# Patient Record
Sex: Male | Born: 1960
Health system: Southern US, Community
[De-identification: ages and names within clinical notes are randomized; demographics above are authoritative.]

## PROBLEM LIST (undated history)

## (undated) DIAGNOSIS — F1910 Other psychoactive substance abuse, uncomplicated: Secondary | ICD-10-CM

## (undated) DIAGNOSIS — Q899 Congenital malformation, unspecified: Secondary | ICD-10-CM

## (undated) DIAGNOSIS — I5020 Unspecified systolic (congestive) heart failure: Secondary | ICD-10-CM

## (undated) DIAGNOSIS — H9190 Unspecified hearing loss, unspecified ear: Secondary | ICD-10-CM

## (undated) DIAGNOSIS — E785 Hyperlipidemia, unspecified: Secondary | ICD-10-CM

## (undated) HISTORY — DX: Other psychoactive substance abuse, uncomplicated: F19.10

## (undated) HISTORY — DX: Congenital malformation, unspecified: Q89.9

## (undated) HISTORY — DX: Hyperlipidemia, unspecified: E78.5

## (undated) HISTORY — PX: ACHILLES TENDON SURGERY: SHX542

## (undated) HISTORY — DX: Unspecified systolic (congestive) heart failure: I50.20

---

## 2015-03-01 ENCOUNTER — Encounter (HOSPITAL_COMMUNITY): Payer: Self-pay

## 2015-03-01 ENCOUNTER — Emergency Department (HOSPITAL_COMMUNITY)
Admission: EM | Admit: 2015-03-01 | Discharge: 2015-03-01 | Disposition: A | Discharge status: Home / Self Care | Payer: Self-pay | Attending: Emergency Medicine | Admitting: Emergency Medicine

## 2015-03-01 DIAGNOSIS — R062 Wheezing: Secondary | ICD-10-CM | POA: Insufficient documentation

## 2015-03-01 DIAGNOSIS — Y9389 Activity, other specified: Secondary | ICD-10-CM | POA: Insufficient documentation

## 2015-03-01 DIAGNOSIS — F1110 Opioid abuse, uncomplicated: Secondary | ICD-10-CM | POA: Insufficient documentation

## 2015-03-01 DIAGNOSIS — F1210 Cannabis abuse, uncomplicated: Secondary | ICD-10-CM | POA: Insufficient documentation

## 2015-03-01 DIAGNOSIS — H9192 Unspecified hearing loss, left ear: Secondary | ICD-10-CM | POA: Insufficient documentation

## 2015-03-01 DIAGNOSIS — Y9289 Other specified places as the place of occurrence of the external cause: Secondary | ICD-10-CM | POA: Insufficient documentation

## 2015-03-01 DIAGNOSIS — Y998 Other external cause status: Secondary | ICD-10-CM | POA: Insufficient documentation

## 2015-03-01 DIAGNOSIS — T50901A Poisoning by unspecified drugs, medicaments and biological substances, accidental (unintentional), initial encounter: Secondary | ICD-10-CM

## 2015-03-01 DIAGNOSIS — T401X1A Poisoning by heroin, accidental (unintentional), initial encounter: Secondary | ICD-10-CM | POA: Insufficient documentation

## 2015-03-01 DIAGNOSIS — F17210 Nicotine dependence, cigarettes, uncomplicated: Secondary | ICD-10-CM | POA: Insufficient documentation

## 2015-03-01 HISTORY — DX: Unspecified hearing loss, unspecified ear: H91.90

## 2015-03-01 LAB — CBC WITH DIFFERENTIAL/PLATELET
Basophils Absolute: 0 10*3/uL (ref 0.0–0.1)
Basophils Relative: 0 %
Eosinophils Absolute: 0.1 10*3/uL (ref 0.0–0.7)
Eosinophils Relative: 2 %
HCT: 34.9 % — ABNORMAL LOW (ref 39.0–52.0)
Hemoglobin: 12.1 g/dL — ABNORMAL LOW (ref 13.0–17.0)
Lymphocytes Relative: 38 %
Lymphs Abs: 2.4 10*3/uL (ref 0.7–4.0)
MCH: 32.2 pg (ref 26.0–34.0)
MCHC: 34.7 g/dL (ref 30.0–36.0)
MCV: 92.8 fL (ref 78.0–100.0)
Monocytes Absolute: 0.4 10*3/uL (ref 0.1–1.0)
Monocytes Relative: 6 %
Neutro Abs: 3.5 10*3/uL (ref 1.7–7.7)
Neutrophils Relative %: 54 %
Platelets: 209 10*3/uL (ref 150–400)
RBC: 3.76 MIL/uL — ABNORMAL LOW (ref 4.22–5.81)
RDW: 12.8 % (ref 11.5–15.5)
WBC: 6.5 10*3/uL (ref 4.0–10.5)

## 2015-03-01 LAB — BASIC METABOLIC PANEL
Anion gap: 8 (ref 5–15)
BUN: 17 mg/dL (ref 6–20)
CO2: 27 mmol/L (ref 22–32)
Calcium: 9.3 mg/dL (ref 8.9–10.3)
Chloride: 108 mmol/L (ref 101–111)
Creatinine, Ser: 0.77 mg/dL (ref 0.61–1.24)
GFR calc Af Amer: >60 mL/min (ref >60)
GFR calc non Af Amer: >60 mL/min (ref >60)
Glucose, Bld: 96 mg/dL (ref 65–99)
Potassium: 4 mmol/L (ref 3.5–5.1)
Sodium: 143 mmol/L (ref 135–145)

## 2015-03-01 LAB — RAPID URINE DRUG SCREEN, HOSP PERFORMED
Amphetamines: NOT DETECTED
Barbiturates: NOT DETECTED
Benzodiazepines: NOT DETECTED
Cocaine: NOT DETECTED
Opiates: POSITIVE — AB
Tetrahydrocannabinol: POSITIVE — AB

## 2015-03-01 LAB — TROPONIN I: Troponin I: <0.03 ng/mL (ref <0.031)

## 2015-03-01 MED ORDER — SODIUM CHLORIDE 0.9 % IV BOLUS (SEPSIS)
1000.0000 mL | Freq: Once | INTRAVENOUS | Status: AC
  Administered 2015-03-01: 1000 mL via INTRAVENOUS
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
## 2015-03-01 NOTE — Discharge Instructions (Signed)
Drug Overdose °Drug overdose happens when you take too much of a drug. An overdose can occur with illegal drugs, prescription drugs, or over-the-counter (OTC) drugs. °The effects of drug overdose can be mild, dangerous, or even deadly. °CAUSES °Drug overdose may be caused by: °· Taking too much of a drug on purpose. °· Taking too much of a drug by accident. °· An error made by a health care provider who prescribes a drug. °· An error made by a pharmacist who fills the prescription order. °Drugs that commonly cause overdose include: °· Mental health drugs. °· Pain medicines. °· Illegal drugs. °· OTC cough and cold medicines. °· Heart medicines. °· Seizure medicines. °RISK FACTORS °Drug overdose is more likely in: °· Children. They may be attracted to colorful pills. Because of children's small size, even a small amount of a drug can be dangerous. °· Elderly people. They may be taking many different drugs. Elderly people may have difficulty reading labels or remembering when they last took their medicine. °The risk of drug overdose is also higher for someone who: °· Takes illegal drugs. °· Takes a drug and drinks alcohol. °· Has a mental health condition. °SYMPTOMS °Signs and symptoms of drug overdose depend on the drug and the amount that was taken. Common danger signs include: °· Behavior changes. °· Sleepiness. °· Slowed breathing. °· Nausea and vomiting. °· Seizures. °· Changes in eye pupil size (very large or very small). °If there are signs of very low blood pressure from a drug overdose (shock), emergency treatment is required. These signs include: °· Cold and clammy skin. °· Pale skin. °· Blue lips. °· Very slow breathing. °· Extreme sleepiness. °· Loss of consciousness. °DIAGNOSIS °Drug overdose may be diagnosed based on your symptoms. It is important that you tell your health care provider: °· All of the drugs that you have taken. °· When you took the drugs. °· Whether you were drinking alcohol. °Your health  care provider will do a physical exam. This exam may include: °· Checking and monitoring your heart rate and rhythm, your temperature, and your blood pressure (vital signs). °· Checking your breathing and oxygen level. °You may also have tests, including:  °· Urine tests to check for drugs in your system. °· Blood tests to check for: °¨ Drugs in your system. °¨ Signs of an imbalance of your blood minerals (electrolytes). °¨ Liver damage. °¨ Kidney damage. °TREATMENT °Supporting your vital signs and your breathing is the first step in treating a drug overdose. Treatment may also include: °· Receiving fluids and electrolytes through an IV tube. °· Having a breathing tube (endotracheal tube) inserted in your airway to help you breathe. °· Having a tube passed through your nose and into your stomach (nasogastric tube) to wash out your stomach. °· Medicines. You may get medicines to: °¨ Make you vomit. °¨ Absorb any medicine that is left in your digestive system (activated charcoal). °¨ Block or reverse the effect of the drug that caused the overdose. °· Having your blood filtered through an artificial kidney machine (hemodialysis). You may need this if your overdose is severe or if you have kidney failure. °· Having ongoing counseling and mental health support if you intentionally overdosed or used an illegal drug. °HOME CARE INSTRUCTIONS °· Take medicines only as directed by your health care provider. Always ask your health care provider to discuss the possible side effects of any new drug that you start taking. °· Keep a list of all of the drugs   that you take, including over-the-counter medicines. Bring this list with you to all of your medical visits.  Read the drug inserts that come with your medicines.  Do not use illegal drugs.  Do not drink alcohol when taking drugs.  Store all medicines in safety containers that are out of the reach of children.  Keep the phone number of your local poison control  center near your phone or on your cell phone.  Get help if you are struggling with alcohol or drug use.  Get help if you are struggling with depression or another mental health problem.  Keep all follow-up visits as directed by your health care provider. This is important. SEEK MEDICAL CARE IF:  Your symptoms return.  You develop any new signs or symptoms when you are taking medicines. SEEK IMMEDIATE MEDICAL CARE IF:  You think that you or someone else may have taken too much of a drug. The hotline of the [facility] is 845-507-3659.  You or someone else is having symptoms of a drug overdose.  You have serious thoughts about hurting yourself or others.  You have chest pain.  You have difficulty breathing.  You have a loss of consciousness. Drug overdose is an emergency. Do not wait to see if the symptoms will go away. Get medical help right away. Call your local emergency services (911 in the U.[place] Do not drive yourself to the hospital.   This information is not intended to replace advice given to you by your health care provider. Make sure you discuss any questions you have with your health care provider.   Document Released: 05/12/2014 Document Reviewed: 05/12/2014 Elsevier Interactive Patient Education [employer].   Emergency Department Resource Guide 1) Find a Doctor and Pay Out of Pocket Although you won't have to find out who is covered by your insurance plan, it is a good idea to ask around and get recommendations. You will then need to call the office and see if the doctor you have chosen will accept you as a new patient and what types of options they offer for patients who are self-pay. Some doctors offer discounts or will set up payment plans for their patients who do not have insurance, but you will need to ask so you aren't surprised when you get to your appointment.  2) Contact Your Local Health Department Not all health departments  have doctors that can see patients for sick visits, but many do, so it is worth a call to see if yours does. If you don't know where your local health department is, you can check in your phone book. The CDC also has a tool to help you locate your state's health department, and many state websites also have listings of all of their local health departments.  3) Find a [facility] If your illness is not likely to be very severe or complicated, you may want to try a walk in clinic. These are popping up all over the country in pharmacies, drugstores, and shopping centers. They're usually staffed by nurse practitioners or physician assistants that have been trained to treat common illnesses and complaints. They're usually fairly quick and inexpensive. However, if you have serious medical issues or chronic medical problems, these are probably not your best option.  No Primary Care Doctor: - Call Health Connect at  [phone] - they can help you locate a primary care doctor that  accepts your insurance, provides certain services, etc. - Physician Referral Service-  [phone]  Chronic Pain Problems: Organization         Address  Phone   Notes  [facility]  [phone] Patients need to be referred by their primary care doctor.   Medication Assistance: Organization         Address  Phone   Notes  [facility] Medication [facility] [address] [place], [place] [address] [phone] --Must be a resident of [facility] -- Must have NO insurance coverage whatsoever (no Medicaid/ Medicare, etc.) -- The pt. MUST have a primary care doctor that directs their care regularly and follows them in the community   MedAssist  [phone]   [employer]  [phone]    Agencies that provide inexpensive medical care: Organization         Address  Phone   Notes  [place]  [phone]   [facility]    [phone]    [facility] [place],  [address] [phone]   [place] [address] [place] [phone]   Planned Parenthood    [phone]   [facility]    [phone]   [place] and [place], [place] Phone:  [phone], Fax:  [phone] Hours of Operation:  9 am - 6 pm, M-F.  Also accepts Medicaid/Medicare and self-pay.  [facility] for [place], [address], [place] Phone: [phone], Fax: [phone]. Hours of Operation:  8:30 am - 5:30 pm, M-F.  Also accepts Medicaid and self-pay.  [facility] [place] [address], [place] Phone: [phone]   [place], [place], [place] [phone], Ext. [phone] Mondays & Thursdays: 7-9 AM.  First 15 patients are seen on a first come, first serve basis.    [place] Providers:  Organization         Address  Phone   Notes  [facility] [address], [place] [phone] Also accepts self-pay patients.  [facility] [id number] [place], [place]  [phone]   [place], [address], [place] [phone]   [facility] [address] [place] [phone]   Lucianne Lei [address], [place]   [phone] Only accepts [place] Access [place] patients after they have their name applied to their card.   Self-Pay (no insurance) in [facility]:  Organization         Address  Phone   Notes  Sickle Cell Patients, [facility] Internal Medicine [place] [phone]   [facility] [place] [phone]   [facility] [place]  [place], [place],  [phone]   [facility]/Dr. Osei-Bonsu  [address], [place] or [address], [place] [phone] Phone number for both [place] and [place] locations is the same.  [facility] [address] [place] [phone]   [facility] [address], [place] or [address] [phone]   [facility] [address] [place] [phone], phone; [phone],  fax Sees patients 1st and 3rd Saturday of every month.  Must not qualify for public or private insurance (i.e. Medicaid, Medicare, [place] Health Choice, Veterans' Benefits)  Household income should be no more than 200% of the poverty level The clinic cannot treat you if you are pregnant or think you are pregnant  Sexually transmitted diseases are not treated at the clinic.    Dental Care: Organization         Address  Phone  Notes  [facility] Department of [facility] [place] [phone] Accepts children up to age 55 who are enrolled in [place] or [place]; pregnant women with a Medicaid card; and children who have applied for Medicaid or [place] Health Choice, but were declined, whose parents can pay a reduced fee at time of service.  [facility] Department of [facility]  [address], [place] [phone] Accepts children up to age 52 who are enrolled in [place] or [place]; pregnant women with a Medicaid card; and children who have applied for Medicaid or [place] Health Choice, but were declined, whose parents can pay a reduced fee at time of service.  [place] Adult Dental Access PROGRAM  [place] [phone] Patients are seen by appointment only. Walk-ins are not accepted. [place] will see patients 92 years of age and older. Monday - Tuesday (8am-5pm) Most Wednesdays (8:30-5pm) $30 per visit, cash only  [facility]  Adult Dental Access PROGRAM  [address], [facility] [phone] Patients are seen by appointment only. Walk-ins are not accepted. [place] will see patients 29 years of age and older. One Wednesday Evening (Monthly: Volunteer Based).  $30 per visit, cash only  [place]  [phone] for adults; Children under age 61, call Graduate Pediatric Dentistry at [phone]. Children aged 17-14, please call [phone] to request a pediatric application.  Dental services are provided in all areas of dental care including fillings, crowns and bridges, complete and partial dentures, implants, gum treatment, root canals, and extractions. Preventive care is also provided. Treatment is provided to both adults and children. Patients are selected via a lottery and there is often a waiting list.   [facility] [address] [place]  [phone] [email or web address]   [facility] [address] [place], [place] [phone], Ext. [phone] Second and Fourth Thursday of each month, opens at 6:30 AM; Clinic ends at 9 AM.  Patients are seen on a first-come first-served basis, and a limited number are seen during each clinic.   [facility]  [address] Hillard Danker [place], [place] [phone]   Eligibility Requirements You must have lived in [place], [place], or [place] counties for at least the last three months.   You cannot be eligible for state or federal sponsored [employer], including [employer], [place], or [employer].   You generally cannot be eligible for healthcare insurance through your employer.    How to apply: Eligibility screenings are held every Tuesday and Wednesday afternoon from 1:00 pm until 4:00 pm. You do not need an appointment for the interview!  [facility] [address] [place], [place]   [place]  [place] Department  [place]  [phone]    Behavioral Health Resources in the Community: Intensive  Outpatient Programs Organization         Address  Phone  Notes  [place] [address] [place], [place] [phone]   [facility] Outpatient [address], [place], [place]   ADS: Alcohol & Drug Svcs [address] [place], [place]   [place] [address]  [place], [place] or [phone]   Substance Abuse Resources Organization         Address  Phone  Notes  Alcohol and Drug Services  [phone]   [place]  [phone]   [facility] [place]   Chinita Pester  [phone]   Residential & Outpatient Substance Abuse Program  [phone]   Psychological Services Organization         Address  Phone  Notes  [facility] [place]  [place]  [phone]   [place] [address], [place] or [phone]    Mobile Crisis Teams Organization         Address  Phone  Notes  [facility], Mobile Crisis Care Unit  [phone]   [facility]ive Psychotherapeutic Services  [address] [place], [place]   [place] [address] [place] [phone]    Self-Help/Support Groups Organization         Address  Phone             Notes  [place]. of [place] - variety of support groups  [place] Call for more information  Narcotics Anonymous (NA), [facility] [address], [employer] [place]  2 meetings at this location   Special educational needs teacher         Address  Phone  Notes  [facility] Residential Treatment [place],    [place]  [phone]   [facility]  [address], [place] [id number], [place], [place]   [place]uthrie Duncan, [place] [phone] Admissions: 8am-3pm M-F  Incentives Substance [place] [address],    [place], [place] [phone]   [facility] [address] [place], [place], [place]   [facility] [address]  [place], [place]   [facility] Programs - Intensive Outpatient [address] Kristeen [address], [place], [place]   [facility] [address]  [place], [place] [phone] or [phone]   [facility] Treatment Services (RTS) [address], [place], [place] Accepts Medicaid  [facility] [place] [address]  [place] [phone] Substance Abuse/Addiction Treatment   [facility] Organization         Address  Phone  Notes  [facility]  [phone]   Domenic Schwab, PhD [address] Arlis Porta [place], [place]   [phone] or [phone]   [place], [place] [phone]   [place] [address], [place], [place] [phone] Insurance/Medicaid/sponsorship through [facility] and Families [address] [place]                                    [place], [place] [phone] [place] [address], [place] [phone]    Dr.  Arfeen  [phone]   [facility] Dept. 1) [address] [place] 2) [place] 3)  [place] [address], [place] [phone]  [phone]   [place] [phone] or [phone] (After Hours)

## 2015-03-24 ENCOUNTER — Ambulatory Visit: Payer: Self-pay

## 2016-11-04 ENCOUNTER — Emergency Department (HOSPITAL_COMMUNITY)
Admission: EM | Admit: 2016-11-04 | Discharge: 2016-11-04 | Disposition: A | Discharge status: Home / Self Care | Payer: BLUE CROSS/BLUE SHIELD | Attending: Emergency Medicine | Admitting: Emergency Medicine

## 2016-11-04 ENCOUNTER — Encounter (HOSPITAL_COMMUNITY): Payer: Self-pay

## 2016-11-04 DIAGNOSIS — Z7151 Drug abuse counseling and surveillance of drug abuser: Secondary | ICD-10-CM | POA: Insufficient documentation

## 2016-11-04 DIAGNOSIS — Z5321 Procedure and treatment not carried out due to patient leaving prior to being seen by health care provider: Secondary | ICD-10-CM | POA: Diagnosis not present

## 2016-11-04 NOTE — ED Notes (Signed)
Pt no longer in room.  

## 2016-11-04 NOTE — ED Triage Notes (Signed)
Pt states that he did heroin tonight and it freaked him out, he tried to calm down in his room and his family wanted him to come in for heroin detox

## 2016-11-04 NOTE — ED Notes (Signed)
Bed: WTR7 Expected date:  Expected time:  Means of arrival:  Comments: 

## 2017-05-03 ENCOUNTER — Ambulatory Visit (HOSPITAL_COMMUNITY)
Admission: EM | Admit: 2017-05-03 | Discharge: 2017-05-03 | Disposition: A | Discharge status: Home / Self Care | Payer: Worker's Compensation | Attending: Internal Medicine | Admitting: Internal Medicine

## 2017-05-03 ENCOUNTER — Ambulatory Visit (INDEPENDENT_AMBULATORY_CARE_PROVIDER_SITE_OTHER): Payer: Worker's Compensation

## 2017-05-03 ENCOUNTER — Encounter (HOSPITAL_COMMUNITY): Payer: Self-pay | Admitting: Family Medicine

## 2017-05-03 DIAGNOSIS — M25572 Pain in left ankle and joints of left foot: Secondary | ICD-10-CM

## 2017-05-03 MED ORDER — MELOXICAM 7.5 MG PO TABS: 7.5000 mg | ORAL_TABLET | Freq: Every day | ORAL | 0 refills | Status: DC

## 2017-05-03 NOTE — ED Provider Notes (Signed)
[place]    CSN: [id number] Arrival date & time: 05/03/17  1656     History   Chief Complaint Chief Complaint  Patient presents with  . Ankle Pain    HPI Bryan Herring is a 57 y.o. male.   57 year old male comes in for evaluation of left ankle pain after injury about 2:30pm today.  Patient works at [employer], was working when he slipped off of a curb, states he both inverted and everted his ankle.  He was not able to ambulate after the incident.  States diffuse pain around the ankles that is worse with weightbearing.  Has been limping/hopping since the incident.  Took ibuprofen 400 mg with some relief.  No obvious swelling, contusion.  Denies numbness, tingling.      Past Medical History:  Diagnosis Date  . Deaf    left ear    There are no active problems to display for this patient.   Past Surgical History:  Procedure Laterality Date  . ACHILLES TENDON SURGERY         Home Medications    Prior to Admission medications   Medication Sig Start Date End Date Taking? Authorizing Provider  Aspirin-Acetaminophen-Caffeine (PAIN RELIEVER PLUS PO) Take 1 tablet by mouth every 6 (six) hours as needed (pain.).    [provider]  meloxicam (MOBIC) 7.5 MG tablet Take 1 tablet (7.5 mg total) by mouth daily. 05/03/17   Ok Edwards, PA-C    Family History Family History  Problem Relation Age of Onset  . Aneurysm Mother     Social History Social History   Tobacco Use  . Smoking status: Current Every Day Smoker    Packs/day: 0.25    Types: Cigarettes  . Smokeless tobacco: Never Used  Substance Use Topics  . Alcohol use: Yes    Comment: 3 times a week  . Drug use: Yes    Types: Marijuana    Comment: heroin      Allergies   Patient has no known allergies.   Review of Systems Review of Systems  Reason unable to perform ROS: See HPI as above.     Physical Exam Triage Vital Signs ED Triage Vitals  Enc Vitals  Group     BP 05/03/17 1743 (!) 146/111     Pulse Rate 05/03/17 1743 75     Resp 05/03/17 1743 18     Temp 05/03/17 1743 98.4 F (36.9 C)     Temp src --      SpO2 05/03/17 1743 100 %     Weight --      Height --      Head Circumference --      Peak Flow --      Pain Score 05/03/17 1744 10     Pain Loc --      Pain Edu? --      Excl. in [place]? --    No data found.  Updated Vital Signs BP (!) 146/111   Pulse 75   Temp 98.4 F (36.9 C)   Resp 18   SpO2 100%   Physical Exam  Constitutional: He is oriented to person, place, and time. He appears well-developed and well-nourished. No distress.  HENT:  Head: Normocephalic and atraumatic.  Eyes: Pupils are equal, round, and reactive to light. Conjunctivae are normal.  Musculoskeletal:  No obvious swelling, erythema, increased warmth, contusion to the ankle.  Tenderness to palpation diffusely of the ankle.  Tenderness to  palpation of proximal MTPs.  Decreased range of motion.  Strength deferred.  Sensation intact.  Pedal pulse 2+ and equal bilaterally.  Cap refill less than 2 seconds.  Neurological: He is alert and oriented to person, place, and time.     UC Treatments / Results  Labs (all labs ordered are listed, but only abnormal results are displayed) Labs Reviewed - No data to display  EKG None Radiology Dg Ankle Complete Left  Result Date: 05/03/2017 CLINICAL DATA:  Left ankle pain after injury today. EXAM: LEFT ANKLE COMPLETE - 3+ VIEW COMPARISON:  None. FINDINGS: There is no evidence of fracture, dislocation, or joint effusion. There is no evidence of arthropathy or other focal bone abnormality. Soft tissues are unremarkable. IMPRESSION: Normal left ankle. Electronically Signed   By: Marijo Conception, M.D.   On: 05/03/2017 18:29    Procedures Procedures (including critical care time)  Medications Ordered in UC Medications - No data to display   Initial Impression / Assessment and Plan / UC Course  I have reviewed  the triage vital signs and the nursing notes.  Pertinent labs & imaging results that were available during my care of the patient were reviewed by me and considered in my medical decision making (see chart for details).    X-ray negative for fracture or dislocation.  Mobic, ice compress, elevation, ankle brace during activity.  Crutches as needed.  Follow-up with occupational health for further evaluation and management needed.  Final Clinical Impressions(s) / UC Diagnoses   Final diagnoses:  Acute left ankle pain    ED Discharge Orders        Ordered    meloxicam (MOBIC) 7.5 MG tablet  Daily     05/03/17 1841         Ok Edwards, PA-C 05/03/17 2008

## 2017-05-03 NOTE — ED Triage Notes (Signed)
Pt here for left ankle pain after twisting it today when he slipped of a curb.

## 2017-05-03 NOTE — Discharge Instructions (Addendum)
X-ray negative for fracture or dislocation.  Start Mobic as directed.  Ice compress, elevation.  Ankle brace during activity.  He can use crutches as needed while your ankle is healing.  Follow-up with occupational health tomorrow for reevaluation and assessment.

## 2017-08-19 ENCOUNTER — Emergency Department (HOSPITAL_COMMUNITY): Payer: Self-pay

## 2017-08-19 ENCOUNTER — Emergency Department (HOSPITAL_COMMUNITY)
Admission: EM | Admit: 2017-08-19 | Discharge: 2017-08-19 | Disposition: A | Discharge status: Home / Self Care | Payer: Self-pay | Attending: Emergency Medicine | Admitting: Emergency Medicine

## 2017-08-19 ENCOUNTER — Other Ambulatory Visit: Payer: Self-pay

## 2017-08-19 ENCOUNTER — Encounter (HOSPITAL_COMMUNITY): Payer: Self-pay

## 2017-08-19 DIAGNOSIS — N289 Disorder of kidney and ureter, unspecified: Secondary | ICD-10-CM | POA: Insufficient documentation

## 2017-08-19 DIAGNOSIS — F17210 Nicotine dependence, cigarettes, uncomplicated: Secondary | ICD-10-CM | POA: Insufficient documentation

## 2017-08-19 DIAGNOSIS — R10816 Epigastric abdominal tenderness: Secondary | ICD-10-CM | POA: Insufficient documentation

## 2017-08-19 DIAGNOSIS — E860 Dehydration: Secondary | ICD-10-CM | POA: Insufficient documentation

## 2017-08-19 DIAGNOSIS — E876 Hypokalemia: Secondary | ICD-10-CM | POA: Insufficient documentation

## 2017-08-19 DIAGNOSIS — R55 Syncope and collapse: Secondary | ICD-10-CM

## 2017-08-19 LAB — COMPREHENSIVE METABOLIC PANEL
ALK PHOS: 48 U/L (ref 38–126)
ALT: 17 U/L (ref 0–44)
ANION GAP: 11 (ref 5–15)
AST: 21 U/L (ref 15–41)
Albumin: 4.2 g/dL (ref 3.5–5.0)
BUN: 12 mg/dL (ref 6–20)
CHLORIDE: 107 mmol/L (ref 98–111)
CO2: 25 mmol/L (ref 22–32)
CREATININE: 1.52 mg/dL — AB (ref 0.61–1.24)
Calcium: 9 mg/dL (ref 8.9–10.3)
GFR calc Af Amer: 57 mL/min — ABNORMAL LOW (ref >60)
GFR calc non Af Amer: 50 mL/min — ABNORMAL LOW (ref >60)
GLUCOSE: 214 mg/dL — AB (ref 70–99)
Potassium: 2.9 mmol/L — ABNORMAL LOW (ref 3.5–5.1)
Sodium: 143 mmol/L (ref 135–145)
Total Bilirubin: 0.7 mg/dL (ref 0.3–1.2)
Total Protein: 7.4 g/dL (ref 6.5–8.1)

## 2017-08-19 LAB — I-STAT CG4 LACTIC ACID, ED
LACTIC ACID, VENOUS: 2.75 mmol/L — AB (ref 0.5–1.9)
Lactic Acid, Venous: 4.23 mmol/L (ref 0.5–1.9)

## 2017-08-19 LAB — CBC
HCT: 40 % (ref 39.0–52.0)
Hemoglobin: 13.6 g/dL (ref 13.0–17.0)
MCH: 32.1 pg (ref 26.0–34.0)
MCHC: 34 g/dL (ref 30.0–36.0)
MCV: 94.3 fL (ref 78.0–100.0)
PLATELETS: 221 10*3/uL (ref 150–400)
RBC: 4.24 MIL/uL (ref 4.22–5.81)
RDW: 13.3 % (ref 11.5–15.5)
WBC: 7.8 10*3/uL (ref 4.0–10.5)

## 2017-08-19 LAB — URINALYSIS, ROUTINE W REFLEX MICROSCOPIC
Bacteria, UA: NONE SEEN
Bilirubin Urine: NEGATIVE
GLUCOSE, UA: 50 mg/dL — AB
KETONES UR: NEGATIVE mg/dL
LEUKOCYTES UA: NEGATIVE
Nitrite: NEGATIVE
PH: 7 (ref 5.0–8.0)
PROTEIN: NEGATIVE mg/dL
Specific Gravity, Urine: 1.008 (ref 1.005–1.030)

## 2017-08-19 LAB — I-STAT CHEM 8, ED
BUN: 10 mg/dL (ref 6–20)
CHLORIDE: 104 mmol/L (ref 98–111)
Calcium, Ion: 1.09 mmol/L — ABNORMAL LOW (ref 1.15–1.40)
Creatinine, Ser: 1.6 mg/dL — ABNORMAL HIGH (ref 0.61–1.24)
Glucose, Bld: 203 mg/dL — ABNORMAL HIGH (ref 70–99)
HCT: 41 % (ref 39.0–52.0)
Hemoglobin: 13.9 g/dL (ref 13.0–17.0)
POTASSIUM: 2.9 mmol/L — AB (ref 3.5–5.1)
SODIUM: 143 mmol/L (ref 135–145)
TCO2: 25 mmol/L (ref 22–32)

## 2017-08-19 LAB — I-STAT TROPONIN, ED
Troponin i, poc: 0 ng/mL (ref 0.00–0.08)
Troponin i, poc: 0.02 ng/mL (ref 0.00–0.08)

## 2017-08-19 LAB — CBG MONITORING, ED: Glucose-Capillary: 214 mg/dL — ABNORMAL HIGH (ref 70–99)

## 2017-08-19 MED ORDER — SODIUM CHLORIDE 0.9 % IV BOLUS
1000.0000 mL | Freq: Once | INTRAVENOUS | Status: AC
  Administered 2017-08-19: 1000 mL via INTRAVENOUS

## 2017-08-19 MED ORDER — POTASSIUM CHLORIDE CRYS ER 20 MEQ PO TBCR
40.0000 meq | EXTENDED_RELEASE_TABLET | Freq: Once | ORAL | Status: AC
  Administered 2017-08-19: 40 meq via ORAL
  Filled 2017-08-19: qty 2

## 2017-08-19 NOTE — ED Provider Notes (Signed)
4:30 PM-checkout from Dr. Ralene Bathe to evaluate after treatment, to consider discharge.  Patient's delta lactate has returned elevated, despite receiving some IV fluids.  He has apparently received 2 L.  No weight on patient yet.  Will check weight and make sure he has 30 cc/kg.  Prior evaluation reviewed.  Clinical Course as of Aug 20 1822  Nancy Fetter Aug 19, 2017  1724 Elevated, higher than initial  I-Stat CG4 Lactic Acid, ED(!!) [EW]  1725 Normal except presence of glucose and hemoglobin  Urinalysis, Routine w reflex microscopic(!) [EW]  1725 Normal  I-stat troponin, ED [EW]  1725 Normal except potassium low, glucose high, creatinine high  Comprehensive metabolic panel(!) [EW]  9485 No additional abnormalities  I-stat Chem 8, ED(!) [EW]  1725 Normal  CBC [EW]  1725 No intracranial abnormality, apparent posterior contusion, images reviewed by me  CT Head Wo Contrast [EW]  1737 Brief physical exam done to evaluate patient with elevated lactate, and hypotension.  He is alert and cooperative.  He appears somewhat under nourished.  There is no respiratory distress.  Abdomen is soft and nontender.  Arms move normally do not appear to have any signs of swelling or infection.  Pelvis tender right posterior without deformity.  He moves both legs normally.  There are no sores or abscesses on the legs.  Patient ate a sandwich here and states he is currently hungry again.  Will attempt to give him more food today.   [EW]    Clinical Course User Index [EW] Daleen Bo, MD     EKG Interpretation  Date/Time:  Sunday August 19 2017 15:44:48 EDT Ventricular Rate:  73 PR Interval:    QRS Duration: 87 QT Interval:  398 QTC Calculation: 439 R Axis:   75 Text Interpretation:  Sinus rhythm Left ventricular hypertrophy ST elevation c/w LVH vs early repolarization Confirmed by Quintella Reichert [phone]) on 08/19/2017 3:49:41 PM         Patient Vitals for the past 24 hrs:  BP Temp Temp src Pulse Resp SpO2  Height Weight  08/19/17 1800 - - - - - - 6' (1.829 m) 74.8 kg  08/19/17 1745 126/88 - - 85 20 97 % - -  08/19/17 1730 136/90 - - - 18 - - -  08/19/17 1715 (!) 141/96 - - 78 18 100 % - -  08/19/17 1700 (!) 131/97 - - 76 19 98 % - -  08/19/17 1645 (!) 146/100 - - 77 19 100 % - -  08/19/17 1630 (!) 137/111 - - 90 (!) 21 100 % - -  08/19/17 1615 (!) 138/100 - - 81 18 100 % - -  08/19/17 1600 (!) 131/100 - - 81 15 100 % - -  08/19/17 1545 (!) 156/103 - - 74 18 100 % - -  08/19/17 1335 (!) 59/42 97.6 F (36.4 C) Oral 73 16 98 % - -    6:24 PM Reevaluation with update and discussion. After initial assessment and treatment, an updated evaluation reveals no worsening status.  Vital signs are now normal.  Findings discussed and questions answered. Daleen Bo   Medical Decision Making: Syncope with hypokalemia, and renal insufficiency.  Suspect dehydration as cause for syncope.  Contusion had without signs of intracranial bleeding or persistent symptoms of concussion.  Patient improved and stable for discharge.  Doubt serious bacterial infection, metabolic instability or impending vascular collapse.  CRITICAL CARE-no Performed by: Daleen Bo   Nursing Notes Reviewed/ Care Coordinated Applicable Imaging Reviewed  Interpretation of Laboratory Data incorporated into ED treatment  The patient appears reasonably screened and/or stabilized for discharge and I doubt any other medical condition or other [facility] requiring further screening, evaluation, or treatment in the ED at this time prior to discharge.  Plan: Home Medications-OTC analgesia if needed; Home Treatments-increase oral fluids; return here if the recommended treatment, does not improve the symptoms; Recommended follow up-PCP follow-up 1 week for recheck creatinine and potassium.     Daleen Bo, MD 08/19/17 [phone]

## 2017-08-19 NOTE — ED Notes (Signed)
EDP [facility] notified of critical lactic acid of 4.23

## 2017-08-19 NOTE — ED Triage Notes (Addendum)
Pt reports 2 episodes of syncope today. Pt remembers falling, but is not sure of details. He does endorse hitting the side of his head during one fall. Pt is very hypotensive in triage. Reports feeling weak. States that he has only had some coffee today. Pt denies any additional medical history. States that he hasn't been to the doctor in years.

## 2017-08-19 NOTE — ED Provider Notes (Addendum)
[facility] DEPT Provider Note   CSN: [id number] Arrival date & time: 08/19/17  1327     History   Chief Complaint Chief Complaint  Patient presents with  . Hypotension    HPI Bryan Herring is a 57 y.o. male.  The history is provided by the patient. No language interpreter was used.   Bryan Herring is a 57 y.o. male who presents to the Emergency Department complaining of syncope. He presents to the emergency department complaining of syncope times two. He has a history of drug abuse and loss abuse heroin three days ago. He sniffs heroin. Yesterday he had multiple episodes of emesis that he relates to withdrawing from heroin. Today he was out of the car wash and he smokes marijuana. He then began to feel lightheaded and fell backwards, striking his head. He did lose consciousness and his sister states that he was unresponsive for a few minutes. He had no shaking activity. He was able to get back up and go home. Then he had another syncopal event on arriving home. He denies any hematochezia, hematemesis. He has no medical problems and takes no medications. No prior similar symptoms. Past Medical History:  Diagnosis Date  . Deaf    left ear    There are no active problems to display for this patient.   Past Surgical History:  Procedure Laterality Date  . ACHILLES TENDON SURGERY          Home Medications    Prior to Admission medications   Medication Sig Start Date End Date Taking? Authorizing Provider  meloxicam (MOBIC) 7.5 MG tablet Take 1 tablet (7.5 mg total) by mouth daily. Patient not taking: Reported on 08/19/2017 05/03/17   Arturo Morton    Family History Family History  Problem Relation Age of Onset  . Aneurysm Mother     Social History Social History   Tobacco Use  . Smoking status: Current Every Day Smoker    Packs/day: 0.25    Types: Cigarettes  . Smokeless tobacco: Never Used  Substance Use Topics   . Alcohol use: Yes    Comment: 3 times a week  . Drug use: Yes    Types: Marijuana    Comment: heroin      Allergies   Patient has no known allergies.   Review of Systems Review of Systems  All other systems reviewed and are negative.    Physical Exam Updated Vital Signs BP (!) 59/42 (BP Location: Right Arm) Comment: repeat was 56/43  Pulse 73   Temp 97.6 F (36.4 C) (Oral)   Resp 16   SpO2 98%   Physical Exam  Constitutional: He is oriented to person, place, and time. He appears well-developed and well-nourished.  HENT:  Head: Normocephalic and atraumatic.  Cardiovascular: Normal rate and regular rhythm.  No murmur heard. Pulmonary/Chest: Effort normal and breath sounds normal. No respiratory distress.  Abdominal: There is no tenderness. There is no rebound and no guarding.  Mild epigastric tenderness  Musculoskeletal: He exhibits no edema or tenderness.  Neurological: He is alert and oriented to person, place, and time.  Generalized weakness  Skin: Skin is warm and dry.  Psychiatric: He has a normal mood and affect. His behavior is normal.  Nursing note and vitals reviewed.    ED Treatments / Results  Labs (all labs ordered are listed, but only abnormal results are displayed) Labs Reviewed  URINALYSIS, ROUTINE W REFLEX MICROSCOPIC - Abnormal; Notable for  the following components:      Result Value   Glucose, UA 50 (*)    Hgb urine dipstick SMALL (*)    All other components within normal limits  COMPREHENSIVE METABOLIC PANEL - Abnormal; Notable for the following components:   Potassium 2.9 (*)    Glucose, Bld 214 (*)    Creatinine, Ser 1.52 (*)    GFR calc non Af Amer 50 (*)    GFR calc Af Amer 57 (*)    All other components within normal limits  CBG MONITORING, ED - Abnormal; Notable for the following components:   Glucose-Capillary 214 (*)    All other components within normal limits  I-STAT CHEM 8, ED - Abnormal; Notable for the following  components:   Potassium 2.9 (*)    Creatinine, Ser 1.60 (*)    Glucose, Bld 203 (*)    Calcium, Ion 1.09 (*)    All other components within normal limits  I-STAT CG4 LACTIC ACID, ED - Abnormal; Notable for the following components:   Lactic Acid, Venous 2.75 (*)    All other components within normal limits  CBC  I-STAT TROPONIN, ED  I-STAT CG4 LACTIC ACID, ED  I-STAT TROPONIN, ED    EKG EKG Interpretation  Date/Time:  Sunday August 19 2017 15:44:48 EDT Ventricular Rate:  73 PR Interval:    QRS Duration: 87 QT Interval:  398 QTC Calculation: 439 R Axis:   75 Text Interpretation:  Sinus rhythm Left ventricular hypertrophy ST elevation c/w LVH vs early repolarization Confirmed by Quintella Reichert [phone]) on 08/19/2017 3:49:41 PM   Radiology Ct Head Wo Contrast  Result Date: 08/19/2017 CLINICAL DATA:  Patient has been smoking marijuana with trauma to the head and passing out. Patient complains of headaches. EXAM: CT HEAD WITHOUT CONTRAST TECHNIQUE: Contiguous axial images were obtained from the base of the skull through the vertex without intravenous contrast. COMPARISON:  None. FINDINGS: Brain: No evidence of acute infarction, hemorrhage, hydrocephalus, extra-axial collection or mass lesion/mass effect. Vascular: No hyperdense vessel or unexpected calcification. Skull: Normal. Negative for fracture or focal lesion. Sinuses/Orbits: No acute finding. Other: There is right parietal scalp swelling and hematoma. IMPRESSION: No focal acute intracranial abnormality identified. Right parietal scalp swelling and hematoma. Electronically Signed   By: Abelardo Diesel M.D.   On: 08/19/2017 14:13    Procedures Procedures (including critical care time)  Medications Ordered in ED Medications  sodium chloride 0.9 % bolus 1,000 mL (0 mLs Intravenous Stopped 08/19/17 1610)  sodium chloride 0.9 % bolus 1,000 mL (0 mLs Intravenous Stopped 08/19/17 1610)  potassium chloride SA (K-DUR,KLOR-CON) CR tablet  40 mEq (40 mEq Oral Given 08/19/17 1502)     Initial Impression / Assessment and Plan / ED Course  I have reviewed the triage vital signs and the nursing notes.  Pertinent labs & imaging results that were available during my care of the patient were reviewed by me and considered in my medical decision making (see chart for details).  Clinical Course as of Aug 20 800  Nancy Fetter Aug 19, 2017  1724 Elevated, higher than initial  I-Stat CG4 Lactic Acid, ED(!!) [EW]  1725 Normal except presence of glucose and hemoglobin  Urinalysis, Routine w reflex microscopic(!) [EW]  1725 Normal  I-stat troponin, ED [EW]  1725 Normal except potassium low, glucose high, creatinine high  Comprehensive metabolic panel(!) [EW]  9024 No additional abnormalities  I-stat Chem 8, ED(!) [EW]  1725 Normal  CBC [EW]  1725 No intracranial abnormality,  apparent posterior contusion, images reviewed by me  CT Head Wo Contrast [EW]  2563 Brief physical exam done to evaluate patient with elevated lactate, and hypotension.  He is alert and cooperative.  He appears somewhat under nourished.  There is no respiratory distress.  Abdomen is soft and nontender.  Arms move normally do not appear to have any signs of swelling or infection.  Pelvis tender right posterior without deformity.  He moves both legs normally.  There are no sores or abscesses on the legs.  Patient ate a sandwich here and states he is currently hungry again.  Will attempt to give him more food today.   [EW]    Clinical Course User Index [EW] Daleen Bo, MD    Patient here for evaluation following syncopal events times two. He did have profuse vomiting yesterday and was working outside in a car wash today. He is dehydrated appearing on examination but non-toxic. No concerning features for sepsis, ACS, PE, dissection. His hypertension rapidly resolved after IV fluid administration. He is feeling improved on repeat assessment. Counseled patient on heat  illness, dehydration. Discussed resources for substance abuse.  Patient care transferred pending repeat orthostatics, PO challenge and ambulation.  If patient does well plan to d/c home with close outpatient follow up and return precautions.    Records reviewed in Epic.    Final Clinical Impressions(s) / ED Diagnoses   Final diagnoses:  Dehydration  Syncope, unspecified syncope type    ED Discharge Orders    None       Quintella Reichert, MD 08/19/17 1613    Quintella Reichert, MD 08/20/17 [phone]

## 2017-08-19 NOTE — ED Notes (Signed)
Pt provided with turkey sandwich and sprite.  

## 2017-08-19 NOTE — ED Notes (Signed)
Pt reports to this Probation officer and Dr Ralene Bathe that he was at the car wash today and had been smoking weed. Pt sister reports that she heard a "thump" when pt passed out hitting the back of his head. Pt sister reports that pt was "out for a few minutes". Pt sister adds that when pt got out of the car at home, he passed out again. Pt states that he has a HA, 5/10 but no other c/o. Pt reports that he is a heroin user but last used 3 days ago. Pt is A&O and in NAD. Pt sister at bedside

## 2017-08-19 NOTE — Discharge Instructions (Addendum)
Please follow up to get your kidney function rechecked.  Get rechecked immediately if you have any new or concerning symptoms.    Make sure that you are drinking plenty of water each day.  Try to drink 1 to 2 L of water each day.  Try to eat foods which contain more potassium because your level was a little bit low today.  Return here if needed for problems

## 2017-08-19 NOTE — ED Notes (Signed)
Pt allowed to wait in room for sister to pick him up d/t difficulty in sitting in wheelchair. Pt has soreness and pain in sacral area from fall prior to arrival.

## 2017-08-29 ENCOUNTER — Inpatient Hospital Stay: Payer: Self-pay

## 2017-08-29 NOTE — Progress Notes (Deleted)
Patient ID: Bryan Herring, male   DOB: 05/29/1960, 56 y.o.   MRN: [id number]   Seen in ED 08/19/2017 for dehydration. From note:   Syncope with hypokalemia, and renal insufficiency.  Suspect dehydration as cause for syncope.  Contusion had without signs of intracranial bleeding or persistent symptoms of concussion.  Patient improved and stable for discharge.  Doubt serious bacterial infection, metabolic instability or impending vascular collapse.

## 2017-10-15 ENCOUNTER — Ambulatory Visit: Payer: Self-pay | Attending: Family Medicine | Admitting: Family Medicine

## 2017-10-15 ENCOUNTER — Ambulatory Visit: Payer: Self-pay | Admitting: Family Medicine

## 2017-10-15 ENCOUNTER — Encounter: Payer: Self-pay | Admitting: Family Medicine

## 2017-10-15 VITALS — BP 122/86 | HR 90 | Temp 98.2°F | Resp 18 | Ht 72.0 in | Wt 130.0 lb

## 2017-10-15 DIAGNOSIS — F1190 Opioid use, unspecified, uncomplicated: Secondary | ICD-10-CM | POA: Insufficient documentation

## 2017-10-15 DIAGNOSIS — E876 Hypokalemia: Secondary | ICD-10-CM

## 2017-10-15 DIAGNOSIS — R55 Syncope and collapse: Secondary | ICD-10-CM

## 2017-10-15 DIAGNOSIS — F17210 Nicotine dependence, cigarettes, uncomplicated: Secondary | ICD-10-CM | POA: Insufficient documentation

## 2017-10-15 DIAGNOSIS — F1290 Cannabis use, unspecified, uncomplicated: Secondary | ICD-10-CM | POA: Insufficient documentation

## 2017-10-15 NOTE — Progress Notes (Signed)
Subjective:    Patient ID: Bryan Herring, male    DOB: October 09, 1960, 57 y.o.   MRN: [id number]  HPI 57 year old male new to the practice.  Patient reports that he has had 2 episodes in which he passed out.  Patient states that he was helping to wash his brother's car in early August and patient states that he was bending over to help wash the rims of the car when he became lightheaded and dizzy.  Patient recalls that after bending over, when he tried to stand up straight, he was off balance and Staggering backwards.  Patient believes that he also fell and hit his head.  Patient states that he does have an area on his scalp that is slightly sore to touch and patient states that he believes he pulled the scab off of this area at one point.  Patient states that he was told that he passed out.  Patient states that his sister to come to the emergency department and he was seen per chart notes on 08/19/2017.  Per emergency department notes, patient syncopal episode was thought to be secondary to dehydration and patient was given IV fluids and discharged.      Patient states that after the initial hospitalization, he was making sure that he was drinking plenty of fluids.  Patient however states that last month he was picking up some trash at his job site and passed out while he was at work.  Patient states that it was hot outside that day and he believes that perhaps he became overheated.  Per chart notes, patient was seen in the emergency department in [place], [place] on 09/24/2017 secondary to syncopal episode.  Patient did have a head CT at that visit which was normal.  Patient did have blood work and had a mild decrease in his potassium.  Patient's urine drug screen was also positive for use of marijuana.  Patient thinks that he only had a cup of coffee to drink on the morning before he had an episode of passing out while picking up trash.       Patient states that he has had no further  syncopal episodes since that time and that he is remain well-hydrated.  Patient states that his new job is also less physical.  Patient states that he is currently working on an as-needed basis through temporary agencies.  Patient denies any significant past medical history.  Patient states that he has had surgery in the past after injuring his left Achilles tendon.  Patient reports that he does smoke about half pack per day of cigarettes and occasional use of marijuana.  Patient also admits to use of heroin. (Patient denied any recent use of heroin when speaking with the CMA but did relate to me that he used heroin yesterday ). Patient states that he drinks alcohol but not on a daily basis and tends to drink wine coolers.  Patient is not currently on any prescription medications.  Patient reports family history of his mother dying from a brain aneurysm in 2010. Past Medical History:  Diagnosis Date  . Deaf    left ear   Past Surgical History:  Procedure Laterality Date  . ACHILLES TENDON SURGERY     Family History  Problem Relation Age of Onset  . Aneurysm Mother    Social History   Tobacco Use  . Smoking status: Current Every Day Smoker    Packs/day: 0.25    Types: Cigarettes  . Smokeless  tobacco: Never Used  Substance Use Topics  . Alcohol use: Yes    Comment: 3 times a week  . Drug use: Yes    Types: Marijuana, Heroin    Comment: heroin   No Known Allergies  Review of Systems  Constitutional: Positive for fatigue. Negative for chills, diaphoresis, fever and unexpected weight change.  Respiratory: Negative for cough and shortness of breath.   Cardiovascular: Negative for chest pain, palpitations and leg swelling.  Gastrointestinal: Negative for abdominal pain, blood in stool and nausea.  Genitourinary: Negative for dysuria, flank pain, frequency and hematuria.  Musculoskeletal: Positive for arthralgias. Negative for back pain, gait problem, joint swelling and myalgias.   Neurological: Negative for dizziness, seizures, syncope (not since last hospitalization), facial asymmetry, light-headedness, numbness and headaches.       Objective:   Physical Exam BP 122/86 (BP Location: Left Arm, Patient Position: Sitting, Cuff Size: Normal)   Pulse 90   Temp 98.2 F (36.8 C) (Oral)   Resp 18   Ht 6' (1.829 m)   Wt 130 lb (59 kg)   SpO2 98%   BMI 17.63 kg/m  vital signs and nurse's notes reviewed  General-well-nourished, well-developed older male in no acute distress EENT- patient with muddy sclera, extraocular movements are intact, no nystagmus.  TMs gray, nares with mild edema of the nasal turbinates, patient with poor dentition and mild posterior pharynx erythema Neck-supple, no lymphadenopathy, no thyromegaly, no carotid bruit Lungs-clear to auscultation bilaterally Cardiovascular-regular rate and rhythm Abdomen-soft, nontender Back-no CVA tenderness Extremities-no edema Neuro-cranial nerves II through XII are grossly intact        Assessment & Plan:  1. Syncope, unspecified syncope type Patient's hospital notes from ED visits on 08/19/2017 and 09/24/2017 were reviewed.  Patient with syncopal episodes x2 which may have been related to dehydration the patient also with use of heroin on a regular basis and due to patient's age and gender, I am also going to refer patient to cardiology to see if there were any cardiac factors such as arrhythmias that could have been contributing to patient's syncopal episode.  Patient was warned about the risk of heart attack, stroke and sudden death with use of heroin. - Ambulatory referral to Cardiology - Basic Metabolic Panel  2. Hypokalemia Patient did have some mild hypokalemia during ED evaluation for syncope and patient will have repeat BMP to recheck electrolytes as well as creatinine as patient also had elevated creatinine on initial ED evaluation labs. - Basic Metabolic Panel  *Patient was offered but declined  influenza immunization at today's visit  An After Visit Summary was printed and given to the patient.  Return in about 6 months (around 04/16/2018).

## 2017-10-15 NOTE — Patient Instructions (Signed)
Syncope Syncope is when you lose temporarily pass out (faint). Signs that you may be about to pass out include:  Feeling dizzy or light-headed.  Feeling sick to your stomach (nauseous).  Seeing all white or all black.  Having cold, clammy skin.  If you passed out, get help right away. Call your local emergency services (911 in the [place] Do not drive yourself to the hospital. Follow these instructions at home: Pay attention to any changes in your symptoms. Take these actions to help with your condition:  Have someone stay with you until you feel stable.  Do not drive, use machinery, or play sports until your doctor says it is okay.  Keep all follow-up visits as told by your doctor. This is important.  If you start to feel like you might pass out, lie down right away and raise (elevate) your feet above the level of your heart. Breathe deeply and steadily. Wait until all of the symptoms are gone.  Drink enough fluid to keep your pee (urine) clear or pale yellow.  If you are taking blood pressure or heart medicine, get up slowly and spend many minutes getting ready to sit and then stand. This can help with dizziness.  Take over-the-counter and prescription medicines only as told by your doctor.  Get help right away if:  You have a very bad headache.  You have unusual pain in your chest, tummy, or back.  You are bleeding from your mouth or rectum.  You have black or tarry poop (stool).  You have a very fast or uneven heartbeat (palpitations).  It hurts to breathe.  You pass out once or more than once.  You have jerky movements that you cannot control (seizure).  You are confused.  You have trouble walking.  You are very weak.  You have vision problems. These symptoms may be an emergency. Do not wait to see if the symptoms will go away. Get medical help right away. Call your local emergency services (911 in the [place] Do not drive yourself to the hospital. This  information is not intended to replace advice given to you by your health care provider. Make sure you discuss any questions you have with your health care provider. Document Released: 06/14/2007 Document Revised: 06/03/2015 Document Reviewed: 09/09/2014 Elsevier Interactive Patient Education  2018 [employer]  

## 2017-10-16 LAB — BASIC METABOLIC PANEL WITH GFR
BUN/Creatinine Ratio: 13 (ref 9–20)
BUN: 18 mg/dL (ref 6–24)
CO2: 23 mmol/L (ref 20–29)
Calcium: 9.6 mg/dL (ref 8.7–10.2)
Chloride: 103 mmol/L (ref 96–106)
Creatinine, Ser: 1.36 mg/dL — ABNORMAL HIGH (ref 0.76–1.27)
GFR calc Af Amer: 67 mL/min/1.73
GFR calc non Af Amer: 58 mL/min/1.73 — ABNORMAL LOW
Glucose: 87 mg/dL (ref 65–99)
Potassium: 3.7 mmol/L (ref 3.5–5.2)
Sodium: 144 mmol/L (ref 134–144)

## 2017-10-17 ENCOUNTER — Telehealth: Payer: Self-pay | Admitting: *Deleted

## 2017-10-17 NOTE — Telephone Encounter (Signed)
-----   Message from Antony Blackbird, MD sent at 10/16/2017  4:54 PM EDT ----- Patient has a mild increase in his Cr which is a measure of kidney function but it is better than when he was at the ED on 08/19/17. He needs to remain hydrated. His potassium is now normal

## 2017-10-17 NOTE — Telephone Encounter (Signed)
MA unable to reach patient or leave a VM due to system not being set up. !!!Please inform patient of kidney function showing improvement from hospital stay and potassium is back to normal. Patient needs to continue to stay hydrated!!!

## 2017-11-29 ENCOUNTER — Ambulatory Visit: Payer: Self-pay | Admitting: Family Medicine

## 2017-12-19 ENCOUNTER — Encounter: Payer: Self-pay | Admitting: Cardiology

## 2017-12-25 NOTE — Progress Notes (Deleted)
Cardiology Office Note   Date:  12/25/2017   ID:  Bijan Ridgley, DOB [birth year], MRN [id number]  PCP:  Patient, No Pcp Per  Cardiologist:   No primary care provider on file. Referring:  ***  No chief complaint on file.     History of Present Illness: Bryan Herring is a 57 y.o. male who was referred by ** for evaluation of syncope.  I reviewed ED records for this. In August he was thought to be dehydrated.  ***      Past Medical History:  Diagnosis Date  . Deaf    left ear    Past Surgical History:  Procedure Laterality Date  . ACHILLES TENDON SURGERY       No current outpatient medications on file.   No current facility-administered medications for this visit.     Allergies:   Patient has no known allergies.    Social History:  The patient  reports that he has been smoking cigarettes. He has been smoking about 0.25 packs per day. He has never used smokeless tobacco. He reports current alcohol use. He reports current drug use. Drugs: Marijuana and Heroin.   Family History:  The patient's ***family history includes Aneurysm in his mother.    ROS:  Please see the history of present illness.   Otherwise, review of systems are positive for {NONE DEFAULTED:18576::"none"}.   All other systems are reviewed and negative.    PHYSICAL EXAM: VS:  There were no vitals taken for this visit. , BMI There is no height or weight on file to calculate BMI. GENERAL:  Well appearing HEENT:  Pupils equal round and reactive, fundi not visualized, oral mucosa unremarkable NECK:  No jugular venous distention, waveform within normal limits, carotid upstroke brisk and symmetric, no bruits, no thyromegaly LYMPHATICS:  No cervical, inguinal adenopathy LUNGS:  Clear to auscultation bilaterally BACK:  No CVA tenderness CHEST:  Unremarkable HEART:  PMI not displaced or sustained,S1 and S2 within normal limits, no S3, no S4, no clicks, no rubs, *** murmurs ABD:  Flat,  positive bowel sounds normal in frequency in pitch, no bruits, no rebound, no guarding, no midline pulsatile mass, no hepatomegaly, no splenomegaly EXT:  2 plus pulses throughout, no edema, no cyanosis no clubbing SKIN:  No rashes no nodules NEURO:  Cranial nerves II through XII grossly intact, motor grossly intact throughout PSYCH:  Cognitively intact, oriented to person place and time    EKG:  EKG {ACTION; IS/IS IWL:79892119} ordered today. The ekg ordered today demonstrates ***   Recent Labs: 08/19/2017: ALT 17; Hemoglobin 13.9; Platelets 221 10/15/2017: BUN 18; Creatinine, Ser 1.36; Potassium 3.7; Sodium 144    Lipid Panel No results found for: CHOL, TRIG, HDL, CHOLHDL, VLDL, LDLCALC, LDLDIRECT    Wt Readings from Last 3 Encounters:  10/15/17 130 lb (59 kg)  08/19/17 165 lb (74.8 kg)      Other studies Reviewed: Additional studies/ records that were reviewed today include: ***. Review of the above records demonstrates:  Please see elsewhere in the note.  ***   ASSESSMENT AND PLAN:  SYNCOPE:  ***   Current medicines are reviewed at length with the patient today.  The patient {ACTIONS; HAS/DOES NOT HAVE:19233} concerns regarding medicines.  The following changes have been made:  {PLAN; NO CHANGE:13088:s}  Labs/ tests ordered today include: *** No orders of the defined types were placed in this encounter.    Disposition:   FU with ***    Signed,  Minus Breeding, MD  12/25/2017 10:23 PM    [place]

## 2017-12-27 ENCOUNTER — Ambulatory Visit: Payer: Self-pay | Admitting: Cardiology

## 2017-12-28 ENCOUNTER — Telehealth: Payer: Self-pay | Admitting: Cardiology

## 2017-12-28 NOTE — Telephone Encounter (Signed)
Patient is not to be scheduled with Dr. Percival Spanish.

## 2018-01-04 ENCOUNTER — Encounter: Payer: Self-pay | Admitting: *Deleted

## 2018-02-11 ENCOUNTER — Encounter (HOSPITAL_COMMUNITY): Payer: Self-pay | Admitting: Emergency Medicine

## 2018-02-11 ENCOUNTER — Emergency Department (HOSPITAL_COMMUNITY)
Admission: EM | Admit: 2018-02-11 | Discharge: 2018-02-12 | Disposition: A | Discharge status: Home / Self Care | Payer: Self-pay | Attending: Emergency Medicine | Admitting: Emergency Medicine

## 2018-02-11 DIAGNOSIS — I10 Essential (primary) hypertension: Secondary | ICD-10-CM | POA: Insufficient documentation

## 2018-02-11 DIAGNOSIS — Z5321 Procedure and treatment not carried out due to patient leaving prior to being seen by health care provider: Secondary | ICD-10-CM | POA: Insufficient documentation

## 2018-02-11 NOTE — ED Triage Notes (Signed)
Pt reports that he been having High blood pressure for while and was told to eat bananas to help lower it. Reports last night fell and hit face and c/o left arm numbness. Took motrin for pain this morning. Pt last used heroin on Friday. Reports to using ETOH and marijuana.

## 2018-02-11 NOTE — ED Notes (Signed)
No answer when called for a room. 

## 2018-02-13 ENCOUNTER — Encounter: Payer: Self-pay | Admitting: Family Medicine

## 2018-02-13 ENCOUNTER — Encounter: Payer: Self-pay | Admitting: Neurology

## 2018-02-13 ENCOUNTER — Other Ambulatory Visit: Payer: Self-pay

## 2018-02-13 ENCOUNTER — Ambulatory Visit: Payer: Self-pay | Attending: Family Medicine | Admitting: Family Medicine

## 2018-02-13 VITALS — BP 172/107 | HR 83 | Temp 98.8°F | Resp 18 | Ht 72.0 in | Wt 137.0 lb

## 2018-02-13 DIAGNOSIS — N183 Chronic kidney disease, stage 3 unspecified: Secondary | ICD-10-CM

## 2018-02-13 DIAGNOSIS — B001 Herpesviral vesicular dermatitis: Secondary | ICD-10-CM

## 2018-02-13 DIAGNOSIS — I160 Hypertensive urgency: Secondary | ICD-10-CM

## 2018-02-13 DIAGNOSIS — L03211 Cellulitis of face: Secondary | ICD-10-CM

## 2018-02-13 DIAGNOSIS — R202 Paresthesia of skin: Secondary | ICD-10-CM

## 2018-02-13 DIAGNOSIS — R55 Syncope and collapse: Secondary | ICD-10-CM

## 2018-02-13 DIAGNOSIS — S0081XA Abrasion of other part of head, initial encounter: Secondary | ICD-10-CM

## 2018-02-13 DIAGNOSIS — F17210 Nicotine dependence, cigarettes, uncomplicated: Secondary | ICD-10-CM

## 2018-02-13 MED ORDER — ACYCLOVIR 400 MG PO TABS: 400.0000 mg | ORAL_TABLET | Freq: Three times a day (TID) | ORAL | 3 refills | Status: DC

## 2018-02-13 MED ORDER — TRIAMTERENE-HCTZ 37.5-25 MG PO TABS: 1.0000 | ORAL_TABLET | Freq: Every day | ORAL | 3 refills | Status: DC

## 2018-02-13 MED ORDER — AMOXICILLIN 875 MG PO TABS: 875.0000 mg | ORAL_TABLET | Freq: Two times a day (BID) | ORAL | 0 refills | Status: DC

## 2018-02-13 MED ORDER — MUPIROCIN 2 % EX OINT: TOPICAL_OINTMENT | CUTANEOUS | 2 refills | Status: DC

## 2018-02-13 MED ORDER — AMOXICILLIN 875 MG PO TABS: 875.0000 mg | ORAL_TABLET | Freq: Two times a day (BID) | ORAL | 0 refills | Status: AC

## 2018-02-13 MED ORDER — ACYCLOVIR 400 MG PO TABS: 400.0000 mg | ORAL_TABLET | Freq: Three times a day (TID) | ORAL | 3 refills | Status: AC

## 2018-02-13 MED FILL — ACYCLOVIR 400 MG TABLET: 400 | 5 days supply | Qty: 15 | Fill #0

## 2018-02-13 MED FILL — MUPIROCIN 2% OINTMENT: 2 | 7 days supply | Qty: 22 | Fill #0

## 2018-02-13 MED FILL — TRIAMTERENE/HCTZ 37.5/25 TB: 37.5-25 | 30 days supply | Qty: 30 | Fill #0

## 2018-02-13 MED FILL — AMOXICILLIN 875 MG TABS: 875 | 7 days supply | Qty: 14 | Fill #0

## 2018-02-13 NOTE — Progress Notes (Signed)
Subjective:    Patient ID: Bryan Herring, male    DOB: 07/11/1960, 58 y.o.   MRN: [id number]  HPI       58 yo male who was seen secondary to complaint of syncopal episode on [place] Sunday.  Patient states he had been watching the game with his sister and when he stood up and then walked to the door, patient states that the last thing he recalls is holding the doorknob and then hearing people shout his name to wake up.  Patient states that he went to the emergency department the next day but after being there for 4 hours he was never seen and patient left.  At today's visit, patient reports that he is having numbness in his left arm.  Patient also reports that he was told by his sister that he likely scraped his right side of the face and head on a rug/carpeted floor.  His sister witnessed the fall and told him that he did not hit his head on the door or any other objects.  Patient does have some discomfort over the right forehead in the area where he has scraped his face and patient also has some swelling on the side and beneath his right eye.  Patient denies any changes in vision.  Patient states that he has some swelling of his lips but he believes that this is secondary to cold sores as these tend to recur but most recent cold sores did not occur until his recent syncopal episode.  Patient believes that it is been about a year since he has been having these sudden, unexpected syncopal episodes. (While patient was getting his EKG, patient told medical assistant that he had been using herion and he wonders if this is contributing to his episodes of passing out.)      Patient reports that he has never been diagnosed with hypertension in the past and has never been on medication for hypertension.  Patient does have a mild headache but it is more so in the area where he fell/right side of the head/forehead.  Patient denies any issues with chest pain.  Patient states that since the fall, he has  had numbness in his left arm and has some tingling in the fingertips middle index and thumb.  Past Medical History:  Diagnosis Date  . Deaf    left ear   Past Surgical History:  Procedure Laterality Date  . ACHILLES TENDON SURGERY     Family History  Problem Relation Age of Onset  . Aneurysm Mother    Social History   Tobacco Use  . Smoking status: Current Every Day Smoker    Packs/day: 0.25    Types: Cigarettes  . Smokeless tobacco: Never Used  Substance Use Topics  . Alcohol use: Yes    Comment: 3 times a week  . Drug use: Yes    Types: Marijuana, Heroin    Comment: heroin   No Known Allergies     Review of Systems  Constitutional: Positive for fatigue. Negative for chills and fever.  HENT: Negative for nosebleeds, sore throat and trouble swallowing.   Respiratory: Negative for cough and shortness of breath.   Cardiovascular: Negative for chest pain, palpitations and leg swelling.  Gastrointestinal: Negative for abdominal pain, constipation, diarrhea and nausea.  Endocrine: Negative for polydipsia, polyphagia and polyuria.  Genitourinary: Negative for dysuria and frequency.  Musculoskeletal: Positive for arthralgias. Negative for gait problem.  Neurological: Negative for dizziness, light-headedness and headaches.  Hematological: Negative for adenopathy. Does not bruise/bleed easily.       Objective:   Physical Exam BP (!) 172/107 (BP Location: Right Arm, Cuff Size: Normal)   Pulse 83   Temp 98.8 F (37.1 C) (Oral)   Resp 18   Ht 6' (1.829 m)   Wt 137 lb (62.1 kg)   SpO2 98%   BMI 18.58 kg/m  nurse's notes and vital signs reviewed  General-well-nourished, well-developed but thin framed older male in no acute distress but patient with visible abrasion to the right forehead/temple area with some swelling in this area including along the outer corner of the right eye/cheek, skin is slightly erythematous in this area EENT- normal conjunctiva, extraocular  movements are intact, no nystagmus.  TMs gray, nares with mild edema the nasal turbinates, mild edema of the nasal turbinates, patient with mild posterior pharynx/tonsillar arch erythema Neck-supple, no lymphadenopathy, no carotid bruit Lungs-clear to auscultation bilaterally Cardiovascular-regular rate and rhythm Abdomen-soft, nontender Back-no CVA tenderness, patient has some cervical, thoracic and lumbar paraspinous spasm Skin- patient with abraded areas on the right lateral forehead, temple area and corner of the left eye/cheek.  Patient with some erythema and increased warmth to palpation with a gloved hand over this area as well as mild edema.  Patient also with multiple cold sores on the upper and lower lips with mild edema and erythema of the lips as well Neuro-cranial nerves II through XII are grossly intact, no focal deficits, 5/5 strength in all extremities Psych- patient appears mildly anxious       Assessment & Plan:  1. Syncope, unspecified syncope type Patient reports recurrent syncopal episodes.  Patient with confession to CMA during his EKG that he is also been using heroin on a regular basis and he wonders if this has contributed to his syncopal episodes.  Patient is being referred to cardiology as well as neurology.  Referrals placed prior to knowledge of patient's use of heroin however I did discuss with the patient that his syncopal episodes could be related to cardiac arrhythmia or seizure-like disorder.  Patient will also have BMP and CBC at today's visit to look for electrolyte abnormality or blood disorder which could be contributing to fatigue and syncopal episodes. - Ambulatory referral to Cardiology - Ambulatory referral to Neurology - Basic Metabolic Panel - EKG 02-RKYH - CBC with Differential  2. Hypertensive urgency Patient with hypertensive urgency.  Patient will be placed on Maxide 37.5-25 to help lower the blood pressure.  Patient is encouraged to return to  clinic in a few days for blood pressure nurse visit recheck.  Patient will likely also need repeat BMP in approximately 1 week after start of diuretic medication which may cause prerenal azotemia/diuretic effect.  Due to patient's cocaine use, I did not wish to use other blood pressure medications if blood pressure can be controlled with triamterene HCTZ. - triamterene-hydrochlorothiazide (MAXZIDE-25) 37.5-25 MG tablet; Take 1 tablet by mouth daily. To lower blood pressure  Dispense: 30 tablet; Refill: 3  3. Abrasion of face, initial encounter Patient with abrasions of the right side of the forehead/face status post syncopal episode.  Patient states that his syncopal episode was witnessed and he was told that he did not hit his face/head on anything other than the carpet floor when he fell.  Patient was offered tetanus immunization which he states he believes is up-to-date at this time. - mupirocin ointment (BACTROBAN) 2 %; Apply twice daily to affected skin area x 7 days  Dispense: 30 g; Refill: 2 - amoxicillin (AMOXIL) 875 MG tablet; Take 1 tablet (875 mg total) by mouth 2 (two) times daily for 7 days.  Dispense: 14 tablet; Refill: 0  4. Cellulitis of face Patient appears to have cellulitis of the face based on examination as patient with abrasions, erythema, increased warmth and edema.  Patient will have CBC to look for elevated white blood cell count.  Patient prescribed Bactroban ointment to apply to the open areas and prescription for amoxicillin 875 mg twice daily x7 days to take after meal for treatment of cellulitis.  If patient has worsening of edema,'s increased redness or swelling, he needs to return here or seek further medical attention - CBC with Differential - mupirocin ointment (BACTROBAN) 2 %; Apply twice daily to affected skin area x 7 days  Dispense: 30 g; Refill: 2 - amoxicillin (AMOXIL) 875 MG tablet; Take 1 tablet (875 mg total) by mouth 2 (two) times daily for 7 days.  Dispense:  14 tablet; Refill: 0  5. Recurrent cold sores Patient with recurrent cold sores and patient will be placed on acyclovir.  Dose was lowered secondary to patient with known chronic kidney disease. - acyclovir (ZOVIRAX) 400 MG tablet; Take 1 tablet (400 mg total) by mouth 3 (three) times daily for 5 days. To treat cold sores  Dispense: 15 tablet; Refill: 3  6. Stage III chronic kidney disease ([place]) Patient with known chronic kidney disease and will have BMP and CBC at today's visit in follow-up.  Patient had creatinine of 1.60 on 08/19/2017 and creatinine of 1.36 on 10/15/2017. - CBC with Differential -BMP  An After Visit Summary was printed and given to the patient.  Return in about 1 week (around 02/20/2018) for HTN/syncope; go to ED if any problems occur.

## 2018-02-14 LAB — CBC WITH DIFFERENTIAL/PLATELET
Basophils Absolute: 0 x10E3/uL (ref 0.0–0.2)
Basos: 0 %
EOS (ABSOLUTE): 0.3 x10E3/uL (ref 0.0–0.4)
Eos: 3 %
Hematocrit: 36.3 % — ABNORMAL LOW (ref 37.5–51.0)
Hemoglobin: 12.3 g/dL — ABNORMAL LOW (ref 13.0–17.7)
Immature Grans (Abs): 0 x10E3/uL (ref 0.0–0.1)
Immature Granulocytes: 0 %
Lymphocytes Absolute: 2.4 x10E3/uL (ref 0.7–3.1)
Lymphs: 30 %
MCH: 32 pg (ref 26.6–33.0)
MCHC: 33.9 g/dL (ref 31.5–35.7)
MCV: 95 fL (ref 79–97)
Monocytes Absolute: 0.6 x10E3/uL (ref 0.1–0.9)
Monocytes: 7 %
Neutrophils Absolute: 4.8 x10E3/uL (ref 1.4–7.0)
Neutrophils: 60 %
Platelets: 249 x10E3/uL (ref 150–450)
RBC: 3.84 x10E6/uL — ABNORMAL LOW (ref 4.14–5.80)
RDW: 12.7 % (ref 11.6–15.4)
WBC: 8 x10E3/uL (ref 3.4–10.8)

## 2018-02-14 LAB — BASIC METABOLIC PANEL WITH GFR
BUN/Creatinine Ratio: 17 (ref 9–20)
BUN: 14 mg/dL (ref 6–24)
CO2: 23 mmol/L (ref 20–29)
Calcium: 9.4 mg/dL (ref 8.7–10.2)
Chloride: 103 mmol/L (ref 96–106)
Creatinine, Ser: 0.84 mg/dL (ref 0.76–1.27)
GFR calc Af Amer: 112 mL/min/1.73
GFR calc non Af Amer: 97 mL/min/1.73
Glucose: 89 mg/dL (ref 65–99)
Potassium: 3.9 mmol/L (ref 3.5–5.2)
Sodium: 140 mmol/L (ref 134–144)

## 2018-02-15 ENCOUNTER — Telehealth: Payer: Self-pay | Admitting: *Deleted

## 2018-02-15 NOTE — Telephone Encounter (Signed)
-----   Message from Antony Blackbird, MD sent at 02/14/2018  2:12 PM EST ----- Please notify patient that his BMP was normal but he has mild anemia on CBC with Hgb 12.3 with normal of 13-17. Return for follow-up appointment

## 2018-02-15 NOTE — Telephone Encounter (Signed)
Patient verified DOB Patient is aware of BMP being normal but CBC showing mild anemia. Patient will discuss with PCP on follow up appointment which is 12/22/18 at 10:50 am. No further questions.

## 2018-02-19 ENCOUNTER — Ambulatory Visit: Payer: Self-pay | Admitting: Internal Medicine

## 2018-02-19 ENCOUNTER — Ambulatory Visit (INDEPENDENT_AMBULATORY_CARE_PROVIDER_SITE_OTHER): Payer: Self-pay | Admitting: Internal Medicine

## 2018-02-19 ENCOUNTER — Encounter: Payer: Self-pay | Admitting: Internal Medicine

## 2018-02-19 VITALS — BP 130/84 | HR 81 | Ht 72.0 in | Wt 136.2 lb

## 2018-02-19 DIAGNOSIS — R55 Syncope and collapse: Secondary | ICD-10-CM

## 2018-02-19 DIAGNOSIS — I10 Essential (primary) hypertension: Secondary | ICD-10-CM

## 2018-02-19 DIAGNOSIS — I517 Cardiomegaly: Secondary | ICD-10-CM

## 2018-02-19 NOTE — Patient Instructions (Signed)
Medication Instructions:  Your physician recommends that you continue on your current medications as directed. Please refer to the Current Medication list given to you today.  If you need a refill on your cardiac medications before your next appointment, please call your pharmacy.   Lab work: None ordered  Testing/Procedures: Your physician has requested that you have an echocardiogram. Echocardiography is a painless test that uses sound waves to create images of your heart. It provides your doctor with information about the size and shape of your heart and how well your heart's chambers and valves are working. This procedure takes approximately one hour. There are no restrictions for this procedure.    Follow-Up: At [facility], you and your health needs are our priority.  As part of our continuing mission to provide you with exceptional heart care, we have created designated Provider Care Teams.  These Care Teams include your primary Cardiologist (physician) and Advanced Practice Providers (APPs -  Physician Assistants and Nurse Practitioners) who all work together to provide you with the care you need, when you need it.  You will need a follow up appointment as needed

## 2018-02-19 NOTE — Progress Notes (Signed)
Cardiology Office Note:    Date:  02/19/2018   ID:  Bryan Herring, DOB Sep 25, 1960, MRN [id number]  PCP:  Antony Blackbird, MD  Cardiologist:  No primary care provider on file.  Electrophysiologist:  None   Referring MD: Antony Blackbird, MD   Syncope  History of Present Illness:    Bryan Herring is a 58 y.o. male with a hx of deafness in left ear and substance abuse who is referred for evaluation of syncope.  He recalls watching the Super Bowl with his sister when he stood up quickly and walked to the door and recalls feeling the sensation of going down.  He does not recall if he truly lost consciousness, but he was noted to have scraped the right side of his face on the carpet.  He denies frequent presyncope.  He does endorse to his primary care physicians team that he has been using heroin, and he is suspicious that this may be contributing to his symptoms.  He does not readily endorses on today's exam.  He has no known family history of sudden cardiac death or early MI.  He denies significant prodrome prior to his episode, but does remember than sensation of feeling like he was falling.  The patient denies chest pain, chest pressure, dyspnea at rest or with exertion, palpitations, PND, orthopnea, or leg swelling. Denies dizziness or lightheadedness. Denies snoring and has not be evaluated for sleep apnea.  Past Medical History:  Diagnosis Date  . Deaf    left ear    Past Surgical History:  Procedure Laterality Date  . ACHILLES TENDON SURGERY      Current Medications: Current Meds  Medication Sig  . [EXPIRED] amoxicillin (AMOXIL) 875 MG tablet Take 1 tablet (875 mg total) by mouth 2 (two) times daily for 7 days.  . mupirocin ointment (BACTROBAN) 2 % Apply twice daily to affected skin area x 7 days  . triamterene-hydrochlorothiazide (MAXZIDE-25) 37.5-25 MG tablet Take 1 tablet by mouth daily. To lower blood pressure     Allergies:   Patient has no known allergies.     Social History   Socioeconomic History  . Marital status: Single    Spouse name: Not on file  . Number of children: Not on file  . Years of education: Not on file  . Highest education level: Not on file  Occupational History  . Not on file  Social Needs  . Financial resource strain: Not on file  . Food insecurity:    Worry: Not on file    Inability: Not on file  . Transportation needs:    Medical: Not on file    Non-medical: Not on file  Tobacco Use  . Smoking status: Current Every Day Smoker    Packs/day: 0.25    Types: Cigarettes  . Smokeless tobacco: Never Used  Substance and Sexual Activity  . Alcohol use: Yes    Comment: 3 times a week  . Drug use: Yes    Types: Marijuana, Heroin    Comment: heroin   . Sexual activity: Not Currently  Lifestyle  . Physical activity:    Days per week: Not on file    Minutes per session: Not on file  . Stress: Not on file  Relationships  . Social connections:    Talks on phone: Not on file    Gets together: Not on file    Attends religious service: Not on file    Active member of club or organization: Not  on file    Attends meetings of clubs or organizations: Not on file    Relationship status: Not on file  Other Topics Concern  . Not on file  Social History Narrative  . Not on file     Family History: The patient's family history includes Aneurysm in his mother; Hypertension in his brother and maternal aunt.  ROS:   Please see the history of present illness.    All other systems reviewed and are negative.  EKGs/Labs/Other Studies Reviewed:    The following studies were reviewed today:  EKG: Normal sinus rhythm, moderate voltage criteria for LVH, ventricular rate 81 bpm.  Recent Labs: 08/19/2017: ALT 17 02/13/2018: BUN 14; Creatinine, Ser 0.84; Hemoglobin 12.3; Platelets 249; Potassium 3.9; Sodium 140  Recent Lipid Panel No results found for: CHOL, TRIG, HDL, CHOLHDL, VLDL, LDLCALC, LDLDIRECT  Physical Exam:     VS:  BP 130/84   Pulse 81   Ht 6' (1.829 m)   Wt 136 lb 3.2 oz (61.8 kg)   BMI 18.47 kg/m     Wt Readings from Last 3 Encounters:  02/19/18 136 lb 3.2 oz (61.8 kg)  02/13/18 137 lb (62.1 kg)  10/15/17 130 lb (59 kg)     Constitutional: No acute distress ENMT: moist mucous membranes Cardiovascular: regular rhythm, normal rate, no murmurs. S1 and S2 normal. Radial pulses normal bilaterally. No jugular venous distention.  Respiratory: clear to auscultation bilaterally GI : normal bowel sounds, soft and nontender. No distention.   MSK: extremities warm, well perfused. No edema.  NEURO: grossly nonfocal exam, moves all extremities. PSYCH: alert and oriented x 3, normal mood and affect.   ASSESSMENT:    1. LVH (left ventricular hypertrophy)   2. Syncope, unspecified syncope type   3. Essential hypertension    PLAN:    Syncope-it is not clear that his symptoms represent cardiac syncope, however we will obtain an echocardiogram to rule out structural causes given his evidence of left ventricular hypertrophy on ECG.  The patient and I have discussed cardiac monitor and he will consider this but defers at this time.  We will ensure that his left ventricular hypertrophy is not representative of a more concerning hypertrophic cardiomyopathy.  If that is the case we will pursue cardiac monitor to ensure he is not having any malignant rhythm disturbance.  Hypertension-he does have hypertension which is likely the source of his left ventricular hypertrophy.  It appears reasonably well-controlled on medication therapy with triamterene HCTZ 37.5-25 mg daily.  Preventive-the patient and I have spoken at length about exercise recommendations and diet and lifestyle modifications.  We have spoken about the Mediterranean diet.  The patient is interested in getting better control with his health.  We will contact the patient with regard to the results of the echocardiogram, if this is largely  unremarkable, the patient will contact us for follow-up as needed.  Medication Adjustments/Labs and Tests Ordered: Current medicines are reviewed at length with the patient today.  Concerns regarding medicines are outlined above.  Orders Placed This Encounter  Procedures  . EKG 12-Lead  . ECHOCARDIOGRAM COMPLETE   No orders of the defined types were placed in this encounter.   Patient Instructions  Medication Instructions:  Your physician recommends that you continue on your current medications as directed. Please refer to the Current Medication list given to you today.  If you need a refill on your cardiac medications before your next appointment, please call your pharmacy.   Lab  work: None ordered  Testing/Procedures: Your physician has requested that you have an echocardiogram. Echocardiography is a painless test that uses sound waves to create images of your heart. It provides your doctor with information about the size and shape of your heart and how well your heart's chambers and valves are working. This procedure takes approximately one hour. There are no restrictions for this procedure.    Follow-Up: At [facility], you and your health needs are our priority.  As part of our continuing mission to provide you with exceptional heart care, we have created designated Provider Care Teams.  These Care Teams include your primary Cardiologist (physician) and Advanced Practice Providers (APPs -  Physician Assistants and Nurse Practitioners) who all work together to provide you with the care you need, when you need it.  You will need a follow up appointment as needed        Signed, Elouise Munroe, MD  02/19/2018 2:16 PM    [place]

## 2018-02-21 ENCOUNTER — Ambulatory Visit: Payer: Self-pay | Admitting: Family Medicine

## 2018-03-01 DIAGNOSIS — I517 Cardiomegaly: Secondary | ICD-10-CM | POA: Insufficient documentation

## 2018-03-01 DIAGNOSIS — I10 Essential (primary) hypertension: Secondary | ICD-10-CM

## 2018-03-01 DIAGNOSIS — R55 Syncope and collapse: Secondary | ICD-10-CM | POA: Insufficient documentation

## 2018-03-01 HISTORY — DX: Essential (primary) hypertension: I10

## 2018-03-05 ENCOUNTER — Other Ambulatory Visit (HOSPITAL_COMMUNITY): Payer: Self-pay

## 2018-03-12 ENCOUNTER — Ambulatory Visit: Payer: Self-pay | Admitting: Family Medicine

## 2018-03-13 ENCOUNTER — Telehealth: Payer: Self-pay | Admitting: *Deleted

## 2018-03-13 NOTE — Telephone Encounter (Signed)
Patient verified DOB Patient no showed for their most recent appointment 03/12/2018. Patient states he forgot about the appointment and was at work, Patient was rescheduled for 04/10/2018 at 2:30 with PCP.

## 2018-03-18 ENCOUNTER — Encounter: Payer: Self-pay | Admitting: Internal Medicine

## 2018-04-10 ENCOUNTER — Ambulatory Visit: Payer: Self-pay | Admitting: Family Medicine

## 2018-04-11 ENCOUNTER — Encounter: Payer: Self-pay | Admitting: Neurology

## 2018-04-16 ENCOUNTER — Ambulatory Visit: Payer: Self-pay | Admitting: Neurology

## 2019-02-03 ENCOUNTER — Observation Stay (HOSPITAL_COMMUNITY)
Admission: EM | Admit: 2019-02-03 | Discharge: 2019-02-04 | Disposition: A | Discharge status: Home / Self Care | Payer: Self-pay | Attending: Internal Medicine | Admitting: Internal Medicine

## 2019-02-03 ENCOUNTER — Emergency Department (HOSPITAL_COMMUNITY): Payer: Self-pay

## 2019-02-03 ENCOUNTER — Other Ambulatory Visit: Payer: Self-pay

## 2019-02-03 DIAGNOSIS — T507X1A Poisoning by analeptics and opioid receptor antagonists, accidental (unintentional), initial encounter: Secondary | ICD-10-CM | POA: Insufficient documentation

## 2019-02-03 DIAGNOSIS — Z20822 Contact with and (suspected) exposure to covid-19: Secondary | ICD-10-CM | POA: Insufficient documentation

## 2019-02-03 DIAGNOSIS — I10 Essential (primary) hypertension: Secondary | ICD-10-CM | POA: Diagnosis present

## 2019-02-03 DIAGNOSIS — T40601A Poisoning by unspecified narcotics, accidental (unintentional), initial encounter: Secondary | ICD-10-CM | POA: Diagnosis present

## 2019-02-03 DIAGNOSIS — N179 Acute kidney failure, unspecified: Secondary | ICD-10-CM | POA: Diagnosis present

## 2019-02-03 DIAGNOSIS — I4891 Unspecified atrial fibrillation: Principal | ICD-10-CM | POA: Diagnosis present

## 2019-02-03 DIAGNOSIS — D72829 Elevated white blood cell count, unspecified: Secondary | ICD-10-CM | POA: Diagnosis present

## 2019-02-03 LAB — CBC WITH DIFFERENTIAL/PLATELET
Abs Immature Granulocytes: 0.26 10*3/uL — ABNORMAL HIGH (ref 0.00–0.07)
Basophils Absolute: 0.1 10*3/uL (ref 0.0–0.1)
Basophils Relative: 0 %
Eosinophils Absolute: 0.1 10*3/uL (ref 0.0–0.5)
Eosinophils Relative: 0 %
HCT: 46.7 % (ref 39.0–52.0)
Hemoglobin: 14.6 g/dL (ref 13.0–17.0)
Immature Granulocytes: 1 %
Lymphocytes Relative: 16 %
Lymphs Abs: 3.1 10*3/uL (ref 0.7–4.0)
MCH: 31.8 pg (ref 26.0–34.0)
MCHC: 31.3 g/dL (ref 30.0–36.0)
MCV: 101.7 fL — ABNORMAL HIGH (ref 80.0–100.0)
Monocytes Absolute: 0.8 10*3/uL (ref 0.1–1.0)
Monocytes Relative: 4 %
Neutro Abs: 15.7 10*3/uL — ABNORMAL HIGH (ref 1.7–7.7)
Neutrophils Relative %: 79 %
Platelets: 231 10*3/uL (ref 150–400)
RBC: 4.59 MIL/uL (ref 4.22–5.81)
RDW: 12.8 % (ref 11.5–15.5)
WBC: 20.1 10*3/uL — ABNORMAL HIGH (ref 4.0–10.5)
nRBC: 0 % (ref 0.0–0.2)

## 2019-02-03 LAB — RESPIRATORY PANEL BY RT PCR (FLU A&B, COVID)
Influenza A by PCR: NEGATIVE
Influenza B by PCR: NEGATIVE
SARS Coronavirus 2 by RT PCR: NEGATIVE

## 2019-02-03 LAB — CK: Total CK: 154 U/L (ref 49–397)

## 2019-02-03 LAB — TSH: TSH: 1.589 u[IU]/mL (ref 0.350–4.500)

## 2019-02-03 LAB — PROTIME-INR
INR: 1.1 (ref 0.8–1.2)
Prothrombin Time: 13.8 seconds (ref 11.4–15.2)

## 2019-02-03 LAB — MAGNESIUM: Magnesium: 2.5 mg/dL — ABNORMAL HIGH (ref 1.7–2.4)

## 2019-02-03 MED ORDER — METOPROLOL TARTRATE 5 MG/5ML IV SOLN
5.0000 mg | Freq: Once | INTRAVENOUS | Status: AC
  Administered 2019-02-03: 23:00:00 5 mg via INTRAVENOUS
  Filled 2019-02-03: qty 5

## 2019-02-03 MED ORDER — KETAMINE HCL 50 MG/5ML IJ SOSY
35.0000 mg | PREFILLED_SYRINGE | Freq: Once | INTRAMUSCULAR | Status: AC
  Administered 2019-02-03: 22:00:00 35 mg via INTRAVENOUS

## 2019-02-03 MED ORDER — METOPROLOL TARTRATE 5 MG/5ML IV SOLN
5.0000 mg | Freq: Once | INTRAVENOUS | Status: AC
  Administered 2019-02-04: 5 mg via INTRAVENOUS
  Filled 2019-02-03: qty 5

## 2019-02-03 MED ORDER — LACTATED RINGERS IV BOLUS
1000.0000 mL | Freq: Once | INTRAVENOUS | Status: AC
Start: 2019-02-03 — End: 2019-02-03
  Administered 2019-02-03: 23:00:00 1000 mL via INTRAVENOUS

## 2019-02-03 NOTE — ED Provider Notes (Signed)
pl [facility] EMERGENCY DEPARTMENT Provider Note   CSN: [id number] Arrival date & time: 02/03/19  2142     History Chief Complaint  Patient presents with  . Altered Mental Status    Bryan [facility] is a 59 y.o. male.  HPI  59 year old male with history of polysubstance abuse presenting to the emergency department after being found down in a mud puddle behind a building, called out by bystanders after they thought he was drunk and he would not respond to them.  When EMS arrived patient was breathing slowly, pinpoint pupils, but unresponsive, given 2 mg IV Narcan, patient woke and was agitated.  On arrival admitted to heroin use, patient was combative and agitated on arrival, found to be hypothermic 88 degrees rectal temperature.  On arrival patient was agitated, hemodynamically stable, airway protected, nasopharyngeal airway removed, patient given ketamine as well as warming blankets and bear hugger was applied.  After initial presentation patient became more calm, answering questions, more alert and comprehensible.  Moving all extremities.  No signs of trauma, EMS denies any other drug paraphernalia or any evidence of any other type of traumatic injury.  EMS states that patient was likely outside for approximately 2 hours.     Past Medical History:  Diagnosis Date  . Deaf    left ear  . Essential hypertension 03/01/2018    Patient Active Problem List   Diagnosis Date Noted  . Atrial fibrillation with RVR ([place]) 02/04/2019  . Overdose opiate, accidental or unintentional, initial encounter ([place]) 02/04/2019  . AKI (acute kidney injury) ([place]) 02/04/2019  . Leukocytosis 02/04/2019  . LVH (left ventricular hypertrophy) 03/01/2018  . Syncope 03/01/2018  . Essential hypertension 03/01/2018    Past Surgical History:  Procedure Laterality Date  . ACHILLES TENDON SURGERY         Family History  Problem Relation Age of Onset  . Aneurysm Mother   .  Hypertension Brother   . Hypertension Maternal Aunt     Social History   Tobacco Use  . Smoking status: Current Every Day Smoker    Packs/day: 0.25    Types: Cigarettes  . Smokeless tobacco: Never Used  Substance Use Topics  . Alcohol use: Yes    Comment: 3 times a week  . Drug use: Yes    Types: Marijuana, Heroin    Comment: heroin     Home Medications Prior to Admission medications   Medication Sig Start Date End Date Taking? Authorizing Provider  carvedilol (COREG) 6.25 MG tablet Take 1 tablet (6.25 mg total) by mouth 2 (two) times daily with a meal. 02/04/19 02/04/20  Harold Hedge, MD  triamterene-hydrochlorothiazide (MAXZIDE-25) 37.5-25 MG tablet Take 1 tablet by mouth daily. 02/04/19 04/05/19  Harold Hedge, MD    Allergies    Patient has no known allergies.  Review of Systems   Review of Systems  Unable to perform ROS: Acuity of condition    Physical Exam Updated Vital Signs BP (!) 130/103   Pulse 95   Temp 97.7 F (36.5 C) (Oral)   Resp 10   Ht 6' (1.829 m)   Wt 60.3 kg   SpO2 94%   BMI 18.04 kg/m   Physical Exam Vitals and nursing note reviewed.  Constitutional:      General: He is in acute distress.     Appearance: Normal appearance. He is well-developed. He is ill-appearing.     Comments: Agitated Combative Incomprehensible words Cold to touch  HENT:  Head: Normocephalic and atraumatic.     Right Ear: External ear normal.     Left Ear: External ear normal.     Nose: Nose normal. No congestion.  Eyes:     Conjunctiva/sclera: Conjunctivae normal.  Cardiovascular:     Rate and Rhythm: Regular rhythm. Tachycardia present.     Heart sounds: No murmur.  Pulmonary:     Effort: Pulmonary effort is normal. No respiratory distress.     Breath sounds: Normal breath sounds.     Comments: tachypnea Abdominal:     Palpations: Abdomen is soft.     Tenderness: There is no abdominal tenderness.  Musculoskeletal:        General: No swelling,  tenderness, deformity or signs of injury. Normal range of motion.     Cervical back: Normal range of motion and neck supple.  Skin:    General: Skin is dry.     Capillary Refill: Capillary refill takes 2 to 3 seconds.     Comments: cool  Neurological:     Mental Status: He is alert. He is disoriented.     Cranial Nerves: No cranial nerve deficit.     Sensory: No sensory deficit.     Motor: No weakness.     Coordination: Coordination normal.     Gait: Gait normal.     Deep Tendon Reflexes: Reflexes normal.  Psychiatric:     Comments: Combative Agitated Disoriented confused     ED Results / Procedures / Treatments   Labs (all labs ordered are listed, but only abnormal results are displayed) Labs Reviewed  CBC WITH DIFFERENTIAL/PLATELET - Abnormal; Notable for the following components:      Result Value   WBC 20.1 (*)    MCV 101.7 (*)    Neutro Abs 15.7 (*)    Abs Immature Granulocytes 0.26 (*)    All other components within normal limits  COMPREHENSIVE METABOLIC PANEL - Abnormal; Notable for the following components:   CO2 17 (*)    Glucose, Bld 309 (*)    Creatinine, Ser 1.61 (*)    GFR calc non Af Amer 46 (*)    GFR calc Af Amer 54 (*)    Anion gap 22 (*)    All other components within normal limits  MAGNESIUM - Abnormal; Notable for the following components:   Magnesium 2.5 (*)    All other components within normal limits  HEMOGLOBIN A1C - Abnormal; Notable for the following components:   Hgb A1c MFr Bld 5.7 (*)    All other components within normal limits  CBC WITH DIFFERENTIAL/PLATELET - Abnormal; Notable for the following components:   WBC 17.4 (*)    RBC 4.09 (*)    Hemoglobin 12.9 (*)    Neutro Abs 15.3 (*)    Abs Immature Granulocytes 0.08 (*)    All other components within normal limits  RESPIRATORY PANEL BY RT PCR (FLU A&B, COVID)  TSH  PROTIME-INR  CK  BASIC METABOLIC PANEL  SODIUM, URINE, RANDOM  CREATININE, URINE, RANDOM  UREA NITROGEN, URINE   HIV ANTIBODY (ROUTINE TESTING W REFLEX)  GLUCOSE, CAPILLARY  GLUCOSE, CAPILLARY  CBC WITH DIFFERENTIAL/PLATELET    EKG EKG Interpretation  Date/Time:  Monday February 03 2019 22:21:54 EST Ventricular Rate:  196 PR Interval:    QRS Duration: 119 QT Interval:  270 QTC Calculation: 466 R Axis:   74 Text Interpretation: Poor quality data, interpretation may be affected Atrial fibrillation with rapid V-rate Ventricular premature complex Nonspecific intraventricular conduction  delay Anterior infarct, old Repolarization abnormality, prob rate related Artifact in lead(s) I II III aVR aVL aVF V1 V2 V3 V4 V5 V6 and baseline wander in lead(s) V2 V3 Confirmed by Blanchie Dessert [phone]) on 02/03/2019 10:25:01 PM   Radiology CT Head Wo Contrast  Result Date: 02/04/2019 CLINICAL DATA:  Found unresponsive with pinpoint pupils, cerebral hemorrhage suspected EXAM: CT HEAD WITHOUT CONTRAST TECHNIQUE: Contiguous axial images were obtained from the base of the skull through the vertex without intravenous contrast. COMPARISON:  CT head August 19, 2017 FINDINGS: Brain: No evidence of acute infarction, hemorrhage, hydrocephalus, extra-axial collection or mass lesion/mass effect. Benign dural calcifications. Vascular: Small amount of gas in the right cavernous sinus, possibly iatrogenic/related to intravenous access. No hyperdense vessels. Minimal calcific plaque in the carotid siphons. Skull: No calvarial fracture or suspicious osseous lesion. No scalp swelling or hematoma. Sinuses/Orbits: Paranasal sinuses and mastoid air cells are predominantly clear. Included orbital structures are unremarkable. Other: Additional foci of likely venous gas in the masticator space. IMPRESSION: Punctate foci of gas present within the right cavernous sinus, with additional foci of likely venous gas in the masticator space. Possibly iatrogenic/related to intravenous access. No other acute intracranial abnormality. Electronically  Signed   By: Lovena Le M.D.   On: 02/04/2019 00:14   DG Chest Portable 1 View  Result Date: 02/03/2019 CLINICAL DATA:  Altered mental status. EXAM: PORTABLE CHEST 1 VIEW COMPARISON:  None. FINDINGS: The lungs are hyperinflated. Extensive bullous disease is seen throughout the right upper lobe. There is no evidence of acute infiltrate, pleural effusion or pneumothorax. The heart size and mediastinal contours are within normal limits. The visualized skeletal structures are unremarkable. IMPRESSION: No active disease. Electronically Signed   By: Virgina Norfolk M.D.   On: 02/03/2019 22:34   ECHOCARDIOGRAM COMPLETE  Result Date: 02/04/2019   ECHOCARDIOGRAM REPORT   Patient Name:   Bryan Herring Date of Exam: 02/04/2019 Medical Rec #:  [id number]               Height:       72.0 in Accession #:    [id number]              Weight:       133.0 lb Date of Birth:  [birth year]               BSA:          1.79 m Patient Age:    72 years                BP:           130/103 mmHg Patient Gender: M                       HR:           95 bpm. Exam Location:  Inpatient Procedure: 2D Echo Indications:    Atrial Fibrillation 427.31 / I48.91  History:        Patient has no prior history of Echocardiogram examinations. LVH                 (left ventricular hypertrophy), Signs/Symptoms:Syncope; Risk                 Factors:Hypertension. AKI (acute kidney injury)                 Overdose opiate.  Sonographer:    Vikki Ports Turrentine Referring Phys: [id number] [place]  1. Left ventricular ejection fraction, by visual estimation, is 40 to 45%. The left ventricle has moderately decreased function. There is no left ventricular hypertrophy.  2. Left ventricular diastolic parameters are consistent with Grade I diastolic dysfunction (impaired relaxation).  3. The left ventricle demonstrates global hypokinesis.  4. Global right ventricle has normal systolic function.The right ventricular size is normal. No  increase in right ventricular wall thickness.  5. Left atrial size was normal.  6. Right atrial size was normal.  7. The mitral valve is normal in structure. No evidence of mitral valve regurgitation. No evidence of mitral stenosis.  8. The tricuspid valve is normal in structure.  9. The tricuspid valve is normal in structure. Tricuspid valve regurgitation is mild. 10. The aortic valve is normal in structure. Aortic valve regurgitation is not visualized. No evidence of aortic valve sclerosis or stenosis. 11. The pulmonic valve was normal in structure. Pulmonic valve regurgitation is not visualized. 12. The inferior vena cava is dilated in size with >50% respiratory variability, suggesting right atrial pressure of 8 mmHg. 13. No prior Echocardiogram. FINDINGS  Left Ventricle: Left ventricular ejection fraction, by visual estimation, is 40 to 45%. The left ventricle has moderately decreased function. The left ventricle demonstrates global hypokinesis. There is no left ventricular hypertrophy. Left ventricular diastolic parameters are consistent with Grade I diastolic dysfunction (impaired relaxation). Normal left atrial pressure. Right Ventricle: The right ventricular size is normal. No increase in right ventricular wall thickness. Global RV systolic function is has normal systolic function. Left Atrium: Left atrial size was normal in size. Right Atrium: Right atrial size was normal in size Pericardium: There is no evidence of pericardial effusion. Mitral Valve: The mitral valve is normal in structure. No evidence of mitral valve regurgitation. No evidence of mitral valve stenosis by observation. Tricuspid Valve: The tricuspid valve is normal in structure. Tricuspid valve regurgitation is mild. Aortic Valve: The aortic valve is normal in structure. Aortic valve regurgitation is not visualized. The aortic valve is structurally normal, with no evidence of sclerosis or stenosis. Pulmonic Valve: The pulmonic valve was  normal in structure. Pulmonic valve regurgitation is not visualized. Pulmonic regurgitation is not visualized. Aorta: The aortic root, ascending aorta and aortic arch are all structurally normal, with no evidence of dilitation or obstruction. Venous: The inferior vena cava is dilated in size with greater than 50% respiratory variability, suggesting right atrial pressure of 8 mmHg. IAS/Shunts: No atrial level shunt detected by color flow Doppler. There is no evidence of a patent foramen ovale. No ventricular septal defect is seen or detected. There is no evidence of an atrial septal defect.  LEFT VENTRICLE PLAX 2D LVIDd:         4.55 cm       Diastology LVIDs:         3.75 cm       LV e' lateral:   10.90 cm/s LV PW:         0.85 cm       LV E/e' lateral: 6.2 LV IVS:        0.85 cm       LV e' medial:    7.08 cm/s LVOT diam:     2.10 cm       LV E/e' medial:  9.6 LV SV:         35 ml LV SV Index:   20.09 LVOT Area:     3.46 cm  LV Volumes (MOD) LV area d, A2C:  24.00 cm LV area d, A4C:    26.20 cm LV area s, A2C:    15.20 cm LV area s, A4C:    16.80 cm LV major d, A2C:   7.00 cm LV major d, A4C:   6.61 cm LV major s, A2C:   5.61 cm LV major s, A4C:   5.52 cm LV vol d, MOD A2C: 68.3 ml LV vol d, MOD A4C: 86.5 ml LV vol s, MOD A2C: 34.9 ml LV vol s, MOD A4C: 43.9 ml LV SV MOD A2C:     33.4 ml LV SV MOD A4C:     86.5 ml LV SV MOD BP:      39.7 ml RIGHT VENTRICLE RV S prime:     10.90 cm/s TAPSE (M-mode): 1.3 cm LEFT ATRIUM             Index       RIGHT ATRIUM           Index LA diam:        3.20 cm 1.79 cm/m  RA Area:     15.50 cm LA Vol (A2C):   53.5 ml 29.86 ml/m RA Volume:   44.50 ml  24.84 ml/m LA Vol (A4C):   50.5 ml 28.19 ml/m LA Biplane Vol: 55.5 ml 30.98 ml/m  AORTIC VALVE LVOT Vmax:   89.40 cm/s LVOT Vmean:  57.800 cm/s LVOT VTI:    0.156 m  AORTA Ao Root diam: 3.20 cm MITRAL VALVE                        TRICUSPID VALVE MV Area (PHT): 3.31 cm             TR Peak grad:   23.0 mmHg MV PHT:         66.41 msec           TR Vmax:        240.00 cm/s MV Decel Time: 229 msec MV E velocity: 67.70 cm/s 103 cm/s  SHUNTS MV A velocity: 80.60 cm/s 70.3 cm/s Systemic VTI:  0.16 m MV E/A ratio:  0.84       1.5       Systemic Diam: 2.10 cm  Mihai Croitoru MD Electronically signed by Sanda Klein MD Signature Date/Time: 02/04/2019/1:00:03 PM    Final     Procedures Procedures (including critical care time)  Medications Ordered in ED Medications  0.9 %  sodium chloride infusion ( Intravenous Rate/Dose Verify 02/04/19 0500)  lactated ringers bolus 1,000 mL (0 mLs Intravenous Stopped 02/03/19 2346)  ketamine 50 mg in normal saline 5 mL (10 mg/mL) syringe (35 mg Intravenous Given 02/03/19 2155)  metoprolol tartrate (LOPRESSOR) injection 5 mg (5 mg Intravenous Given 02/03/19 2246)  metoprolol tartrate (LOPRESSOR) injection 5 mg (5 mg Intravenous Given 02/04/19 0025)    ED Course  I have reviewed the triage vital signs and the nursing notes.  Pertinent labs & imaging results that were available during my care of the patient were reviewed by me and considered in my medical decision making (see chart for details).    MDM Rules/Calculators/A&P                      59 year old male presenting to the ED after being found down outside, unresponsive, responded well to Narcan.  On arrival to the ED patient was tachycardic, agitated and combative.  Patient was given ketamine on arrival.  Patient's initial temperature  was 88 degrees rectal.  Initial active rewarming was initiated with warm blankets and bear hugger.  Initial EKG with atrial fibrillation, with RVR, none known if patient has history of A. fib, patient states he does have a history of an arrhythmia and heart issues but is not very specific with his history, poor historian on arrival.  Patient was given IV Lopressor with good response in his heart rate, after ketamine, patient was more calm, comprehensible speech and was answering questions appropriately,  alert and oriented.  CBC with leukocytosis of 20, likely acute phase reactant secondary to his hypothermia.  Patient admitted to using heroin as well as marijuana tonight.  Denies any other drug use, no trauma.  Pending CT scan of the head at this time.  Metabolic panel significant for an AKI, patient given fluid in the ED.  CK normal.  Chest x-ray normal as well on my review.  Patient will be admitted to the [facility] for continued monitoring of his temperature as well as his metabolic derangements and atrial fibrillation.  Patient remained hemodynamically stable throughout his stay in the ED, after initial rewarming methods were employed, patient temperature improved to 97.7.   Care transferred to provider at 11:30pm. See their note for further care. Pending bed placement at this time.   The attending physician was present and available for all medical decision making and procedures related to this patient's care.  Final Clinical Impression(s) / ED Diagnoses Final diagnoses:  Leukocytosis, unspecified type  AKI (acute kidney injury) ([place])    Rx / DC Orders ED Discharge Orders         Ordered    Increase activity slowly     02/04/19 1438    Diet - low sodium heart healthy     02/04/19 1438    Discharge instructions    Comments: You were seen and examined in the [facility] for heroin overdose and atrial fibrillation which is an abnormal heart rhythm and cared for by a hospitalist.   Upon Discharge:  -Stop using heroin or any other illicit drugs as this is very dangerous and can cause death -Start taking carvedilol 6.25 milligrams twice daily -Restart your home Maxide blood pressure medication tomorrow, 1/27 -Make an appointment with your primary care physician within 7 days -Get lab work prior to your follow up appointment with your PCP  Bring all home medications to your appointment to review Request that your primary physician go over all [facility] tests and procedures/radiological  results at the follow up.   Please get all [facility] records sent to your physician by signing a [facility] release before you go home.     Read the complete instructions along with all the possible side effects for all the medicines you take and that have been prescribed to you. Take any new medicines after you have completely understood and accept all the possible adverse reactions/side effects.   If you have any questions about your discharge medications or the care you received while you were in the [facility], you can call the unit and asked to speak with the hospitalist on call. Once you are discharged, your primary care physician will handle any further medical issues. Please note that NO REFILLS for any discharge medications will be authorized, as it is imperative that you return to your primary care physician (or establish a relationship with a primary care physician if you do not have one) for your aftercare needs so that they can reassess your need for medications and monitor  your lab values.   Do not drive, operate heavy machinery, perform activities at heights, swimming or participation in water activities or provide baby sitting services if your were admitted for loss of consciousness/seizures or if you are on sedating medications including, but not limited to benzodiazepines, sleep medications, narcotic pain medications, etc., until you have been cleared to do so by a medical doctor.   Do not take more than prescribed medications.   Wear a seat belt while driving.  If you have smoked or chewed Tobacco in the last 2 years please stop smoking; also stop any regular Alcohol and/or any Recreational drug use including marijuana.  If you experience worsening of your admission symptoms or develop shortness of breath, chest pain, suicidal or homicidal thoughts or experience a life threatening emergency, you must seek medical attention immediately by calling 911 or calling your PCP immediately.    02/04/19 1438    CBC     02/04/19 [id number]    Basic metabolic panel     [id number]    carvedilol (COREG) 6.25 MG tablet  2 times daily with meals     02/04/19 1409    triamterene-hydrochlorothiazide (MAXZIDE-25) 37.5-25 MG tablet  Daily     02/04/19 1438           Kizzie Fantasia, MD 02/05/19 2006    Blanchie Dessert, MD 02/08/19 [phone]

## 2019-02-03 NOTE — ED Notes (Signed)
Transported to CT 

## 2019-02-03 NOTE — ED Triage Notes (Signed)
EMS received a call about a possible homeless patient that would not respond. Per EMS upon arrival, pt was not responding and pupils pinpoint. 2mg  Narcan given. Pt combative on arrival.

## 2019-02-04 ENCOUNTER — Encounter (HOSPITAL_COMMUNITY): Payer: Self-pay | Admitting: Family Medicine

## 2019-02-04 ENCOUNTER — Observation Stay (HOSPITAL_BASED_OUTPATIENT_CLINIC_OR_DEPARTMENT_OTHER): Payer: Self-pay

## 2019-02-04 ENCOUNTER — Emergency Department (HOSPITAL_COMMUNITY): Payer: Self-pay

## 2019-02-04 DIAGNOSIS — D72829 Elevated white blood cell count, unspecified: Secondary | ICD-10-CM | POA: Diagnosis present

## 2019-02-04 DIAGNOSIS — T40601A Poisoning by unspecified narcotics, accidental (unintentional), initial encounter: Secondary | ICD-10-CM | POA: Diagnosis present

## 2019-02-04 DIAGNOSIS — N179 Acute kidney failure, unspecified: Secondary | ICD-10-CM | POA: Diagnosis present

## 2019-02-04 DIAGNOSIS — I4891 Unspecified atrial fibrillation: Secondary | ICD-10-CM | POA: Diagnosis present

## 2019-02-04 DIAGNOSIS — I361 Nonrheumatic tricuspid (valve) insufficiency: Secondary | ICD-10-CM

## 2019-02-04 LAB — BASIC METABOLIC PANEL
Anion gap: 11 (ref 5–15)
BUN: 19 mg/dL (ref 6–20)
CO2: 25 mmol/L (ref 22–32)
Calcium: 8.9 mg/dL (ref 8.9–10.3)
Chloride: 108 mmol/L (ref 98–111)
Creatinine, Ser: 1.13 mg/dL (ref 0.61–1.24)
GFR calc Af Amer: >60 mL/min (ref >60)
GFR calc non Af Amer: >60 mL/min (ref >60)
Glucose, Bld: 78 mg/dL (ref 70–99)
Potassium: 4.2 mmol/L (ref 3.5–5.1)
Sodium: 144 mmol/L (ref 135–145)

## 2019-02-04 LAB — HEMOGLOBIN A1C
Hgb A1c MFr Bld: 5.7 % — ABNORMAL HIGH (ref 4.8–5.6)
Mean Plasma Glucose: 116.89 mg/dL

## 2019-02-04 LAB — CBC WITH DIFFERENTIAL/PLATELET
Abs Immature Granulocytes: 0.08 10*3/uL — ABNORMAL HIGH (ref 0.00–0.07)
Basophils Absolute: 0 10*3/uL (ref 0.0–0.1)
Basophils Relative: 0 %
Eosinophils Absolute: 0 10*3/uL (ref 0.0–0.5)
Eosinophils Relative: 0 %
HCT: 39 % (ref 39.0–52.0)
Hemoglobin: 12.9 g/dL — ABNORMAL LOW (ref 13.0–17.0)
Immature Granulocytes: 1 %
Lymphocytes Relative: 7 %
Lymphs Abs: 1.2 10*3/uL (ref 0.7–4.0)
MCH: 31.5 pg (ref 26.0–34.0)
MCHC: 33.1 g/dL (ref 30.0–36.0)
MCV: 95.4 fL (ref 80.0–100.0)
Monocytes Absolute: 0.8 10*3/uL (ref 0.1–1.0)
Monocytes Relative: 5 %
Neutro Abs: 15.3 10*3/uL — ABNORMAL HIGH (ref 1.7–7.7)
Neutrophils Relative %: 87 %
Platelets: 233 10*3/uL (ref 150–400)
RBC: 4.09 MIL/uL — ABNORMAL LOW (ref 4.22–5.81)
RDW: 12.9 % (ref 11.5–15.5)
WBC: 17.4 10*3/uL — ABNORMAL HIGH (ref 4.0–10.5)
nRBC: 0 % (ref 0.0–0.2)

## 2019-02-04 LAB — GLUCOSE, CAPILLARY
Glucose-Capillary: 78 mg/dL (ref 70–99)
Glucose-Capillary: 85 mg/dL (ref 70–99)

## 2019-02-04 LAB — SODIUM, URINE, RANDOM: Sodium, Ur: 91 mmol/L

## 2019-02-04 LAB — COMPREHENSIVE METABOLIC PANEL
ALT: 15 U/L (ref 0–44)
AST: 24 U/L (ref 15–41)
Albumin: 4.4 g/dL (ref 3.5–5.0)
Alkaline Phosphatase: 65 U/L (ref 38–126)
Anion gap: 22 — ABNORMAL HIGH (ref 5–15)
BUN: 18 mg/dL (ref 6–20)
CO2: 17 mmol/L — ABNORMAL LOW (ref 22–32)
Calcium: 9.5 mg/dL (ref 8.9–10.3)
Chloride: 104 mmol/L (ref 98–111)
Creatinine, Ser: 1.61 mg/dL — ABNORMAL HIGH (ref 0.61–1.24)
GFR calc Af Amer: 54 mL/min — ABNORMAL LOW (ref >60)
GFR calc non Af Amer: 46 mL/min — ABNORMAL LOW (ref >60)
Glucose, Bld: 309 mg/dL — ABNORMAL HIGH (ref 70–99)
Potassium: 4 mmol/L (ref 3.5–5.1)
Sodium: 143 mmol/L (ref 135–145)
Total Bilirubin: 0.3 mg/dL (ref 0.3–1.2)
Total Protein: 7.8 g/dL (ref 6.5–8.1)

## 2019-02-04 LAB — HIV ANTIBODY (ROUTINE TESTING W REFLEX): HIV Screen 4th Generation wRfx: NONREACTIVE

## 2019-02-04 LAB — ECHOCARDIOGRAM COMPLETE
Height: 72 in
Weight: 2128 oz

## 2019-02-04 LAB — CREATININE, URINE, RANDOM: Creatinine, Urine: 196.78 mg/dL

## 2019-02-04 MED ORDER — AMLODIPINE BESYLATE 5 MG PO TABS
5.0000 mg | ORAL_TABLET | Freq: Every day | ORAL | Status: DC
  Administered 2019-02-04: 5 mg via ORAL
  Filled 2019-02-04: qty 1

## 2019-02-04 MED ORDER — ACETAMINOPHEN 325 MG PO TABS: 650.0000 mg | ORAL_TABLET | Freq: Four times a day (QID) | ORAL | Status: DC | PRN

## 2019-02-04 MED ORDER — HYDROCODONE-ACETAMINOPHEN 5-325 MG PO TABS: 1.0000 | ORAL_TABLET | Freq: Four times a day (QID) | ORAL | Status: DC | PRN

## 2019-02-04 MED ORDER — ENOXAPARIN SODIUM 40 MG/0.4ML ~~LOC~~ SOLN
40.0000 mg | SUBCUTANEOUS | Status: DC
  Administered 2019-02-04: 08:00:00 40 mg via SUBCUTANEOUS
  Filled 2019-02-04: qty 0.4

## 2019-02-04 MED ORDER — TRIAMTERENE-HCTZ 37.5-25 MG PO TABS: 1.0000 | ORAL_TABLET | Freq: Every day | ORAL | 1 refills | Status: DC

## 2019-02-04 MED ORDER — SODIUM CHLORIDE 0.9 % IV SOLN: INTRAVENOUS | Status: AC

## 2019-02-04 MED ORDER — SODIUM CHLORIDE 0.9% FLUSH: 3.0000 mL | INTRAVENOUS | Status: DC | PRN

## 2019-02-04 MED ORDER — CARVEDILOL 6.25 MG PO TABS: 6.2500 mg | ORAL_TABLET | Freq: Two times a day (BID) | ORAL | 1 refills | Status: DC

## 2019-02-04 MED ORDER — SODIUM CHLORIDE 0.9 % IV SOLN: 250.0000 mL | INTRAVENOUS | Status: DC | PRN

## 2019-02-04 MED ORDER — ACETAMINOPHEN 650 MG RE SUPP: 650.0000 mg | Freq: Four times a day (QID) | RECTAL | Status: DC | PRN

## 2019-02-04 MED ORDER — SODIUM CHLORIDE 0.9% FLUSH
3.0000 mL | Freq: Two times a day (BID) | INTRAVENOUS | Status: DC
  Administered 2019-02-04: 10:00:00 3 mL via INTRAVENOUS

## 2019-02-04 MED ORDER — METOPROLOL TARTRATE 5 MG/5ML IV SOLN: 5.0000 mg | INTRAVENOUS | Status: DC | PRN

## 2019-02-04 MED ORDER — INSULIN ASPART 100 UNIT/ML ~~LOC~~ SOLN: 0.0000 [IU] | Freq: Three times a day (TID) | SUBCUTANEOUS | Status: DC

## 2019-02-04 MED ORDER — CARVEDILOL 6.25 MG PO TABS
6.2500 mg | ORAL_TABLET | Freq: Two times a day (BID) | ORAL | Status: DC
  Administered 2019-02-04: 17:00:00 6.25 mg via ORAL
  Filled 2019-02-04: qty 1

## 2019-02-04 MED ORDER — SODIUM CHLORIDE 0.9% FLUSH: 3.0000 mL | Freq: Two times a day (BID) | INTRAVENOUS | Status: DC

## 2019-02-04 MED ORDER — ONDANSETRON HCL 4 MG PO TABS: 4.0000 mg | ORAL_TABLET | Freq: Four times a day (QID) | ORAL | Status: DC | PRN

## 2019-02-04 MED ORDER — ONDANSETRON HCL 4 MG/2ML IJ SOLN
4.0000 mg | Freq: Four times a day (QID) | INTRAMUSCULAR | Status: DC | PRN
  Administered 2019-02-04 (×2): 4 mg via INTRAVENOUS
  Filled 2019-02-04 (×2): qty 2

## 2019-02-04 MED FILL — TRIAMTERENE-HCTZ 37.5-25 MG: 37.5-25 | 30 days supply | Qty: 30 | Fill #0

## 2019-02-04 MED FILL — CARVEDILOL 6.25 MG TABLET: 6.25 | 30 days supply | Qty: 60 | Fill #0

## 2019-02-04 NOTE — ED Provider Notes (Signed)
Patient was initially signed out by previous provider, Dr. Yong Channel, ER resident, under the supervision of Dr. Maryan Rued pending hospitalist admission.  Dr. Yong Channel mentions a signout to oncoming provider in his note.  However, hospitalist team had already accepted the patient for admission and admission orders were placed at time of signout.  I did not see or examine this patient as they were already under the care of the hospitalist team.   Joanne Gavel, PA-C 02/04/19 0143    Blanchie Dessert, MD 02/04/19 1345

## 2019-02-04 NOTE — Progress Notes (Signed)
  Echocardiogram 2D Echocardiogram has been performed.  Bryan Herring [name] 02/04/2019, 11:38 AM

## 2019-02-04 NOTE — TOC Transition Note (Signed)
Transition of Care [facility]) - CM/SW Discharge Note   Patient Details  Name: Bryan Herring MRN: [id number] Date of Birth: 1960-11-03  [facility] CM/SW Contact:  Gabrielle Dare Phone Number: 02/04/2019, 2:21 PM   Clinical Narrative:     CSW was consulted by CM Hassan Rowan to provide resources for pt for substance abuse.  CSW introduce self to pt and role.  Pt refused t resources from CSW and stated that he received treatment for 2 years and if he does not drink and smoke marijuana he will not use.  CSW attempted to explain to pt that it would benefit him if he attended group or individual counseling.  Pt decline resources again.  Pt reported that he used heroin on Monday and have not used it for a long time. TOC team will continue to follow for disposition planning.   Final next level of care: Home/Self Care Barriers to Discharge: No Barriers Identified   Patient Goals and CMS Choice Patient states their goals for this hospitalization and ongoing recovery are:: "to return home"   Choice offered to / list presented to : NA  Discharge Placement                       Discharge Plan and Services In-house Referral: NA Discharge Planning Services: CM Consult, Follow-up appt scheduled, Medication Assistance, [facility] Post Acute Care Choice: NA                    HH Arranged: NA          Social Determinants of Health (SDOH) Interventions     Readmission Risk Interventions No flowsheet data found.

## 2019-02-04 NOTE — Discharge Summary (Signed)
Physician Discharge Summary  Bryan Herring [id number] DOB: [birth year] DOA: 02/03/2019  PCP: Antony Blackbird, MD  Admit date: 02/03/2019 Discharge date: 02/04/2019   Code Status: Full Code  Admitted From: home Discharged to:  home Home Health:none  Equipment/Devices:none  Discharge Condition:stable   Recommendations for Outpatient Follow-up   1. Patient is to be set up with PCP appointment 2. Please follow up BMP/CBC 3. Started on Coreg 6.25 mg twice daily due to hypotension and sinus tachycardia 4. Not started on anticoagulation due to low CHA2DS2-VASc 5. Continue to encourage illicit drug abstinence 6. Recommend dental appointment  Hospital Summary  Bryan Herring is a 59 y.o. male with medical history significant for hypertension, history of syncope, and heroin abuse, who presented to the emergency department after he was found unresponsive.  EMS was called due to the patient being unresponsive, he was found outside in a puddle with pinpoint pupils.  Narcan was administered by EMS, patient woke up and became combative, and was brought into the ED.  By time of admission, the patient was alert, fully oriented, and able to provide a thorough history, reporting that he had been in his usual state of health earlier in the day, had just received his unemployment benefits and decided to "celebrate" by doing some heroin.  He reported that he had been maintaining abstinence from illicit substances recently, does not feel that he is addicted or needs any help, and is now planning not to use again.  He denies any recent fevers, chills, chest pain, palpitations, headache, change in vision or hearing, or focal numbness or weakness.  He denied any recent alcohol use and denies any other drug use. Patient unsure if he has had atrial fibrillation before.   ED Course: Upon arrival to the ED, patient was found to be hypothermic at 31.4 C rectally, saturating 90% on room air, tachycardic to  the 150s, and with stable blood pressure.  EKG features atrial fibrillation with RVR.  Chest x-ray negative for acute cardiopulmonary disease.  Chemistry panel notable for glucose of 309, creatinine 1.61, and bicarbonate still pending.  CBC notable for leukocytosis to 20,100.  CK level is normal.  Patient was given a liter of IV fluids in the ED, IV Lopressor pushes, ketamine initially, and warming blanket.  COVID-19 PCR is negative.    The following day patient had resolution of his symptoms.  Echo performed which showed EF 40 to 45% with grade 1 diastolic dysfunction.  He was discharged in stable condition with addition of Coreg and no anticoagulation due to low risk CHA2DS2-VASc 1 and advised to abstain from illicit drug use.  A & P   Principal Problem:   Atrial fibrillation with RVR (HCC) Active Problems:   Essential hypertension   Overdose opiate, accidental or unintentional, initial encounter ([place])   AKI (acute kidney injury) ([place])   Leukocytosis   1. New onset A. fib with RVR in setting of heroin overdose 1. Returned to sinus rhythm with IV fluids and two doses of lopressor 2. TSH and Mg normal 3. Echo: EF 40 to 45% with grade 1 diastolic dysfunction 4. CHA2DS2-VASc: 1 5. Advised to follow-up with PCP and avoid illicit drug use 2. Heroin overdose status post Narcan x1 1. No agreed to abstain from any illicit drugs at this time 3. Compensated HFpEF 1. Echo as above 4. AKI likely from hemodynamic changes (from atrial fibrillation and heroin overdose) 1. Improved with IV fluids and holding Maxzide 2. Hold Maxzide one more  day and get repeat BMP outpatient 5. Hypertension 1. Resume Maxide tomorrow to help renal function improve 6. Reactive leukocytosis, improving 1. Repeat CBC outpatient 7. Hyperglycemia, resolved   Consultants  . none  Procedures  . none  Antibiotics  none   Subjective  Patient seen and examined at bedside no acute distress and resting comfortably.   No events overnight.  Tolerating diet. In good spirits and anticipating discharge. He became tearful today when I told him the events that led to his hospitalization.  He states that this was a wake-up call for him and is very adamant about never using drugs again.  He appreciated our encounter. Denies any chest pain, shortness of breath, fever, nausea, vomiting, urinary or bowel complaints. Otherwise ROS negative   Objective   Discharge Exam: Vitals:   02/04/19 0500 02/04/19 0700  BP: (!) 121/98 (!) 130/103  Pulse:    Resp: 10 10  Temp:    SpO2: 90% 94%   Vitals:   02/04/19 0154 02/04/19 0255 02/04/19 0500 02/04/19 0700  BP: (!) 133/103  (!) 121/98 (!) 130/103  Pulse: 95     Resp:   10 10  Temp:      TempSrc:      SpO2: 98%  90% 94%  Weight:  60.3 kg    Height:  6' (1.829 m)      Physical Exam Vitals and nursing note reviewed.  Constitutional:      Appearance: He is not ill-appearing.  HENT:     Head: Normocephalic.     Mouth/Throat:     Comments: Poor dentition Cardiovascular:     Rate and Rhythm: Normal rate and regular rhythm.  Pulmonary:     Effort: Pulmonary effort is normal. No respiratory distress.  Abdominal:     General: Abdomen is flat. There is no distension.  Musculoskeletal:        General: No swelling or tenderness. Normal range of motion.     Cervical back: No rigidity.  Skin:    Coloration: Skin is not jaundiced or pale.  Neurological:     Mental Status: He is alert. Mental status is at baseline.  Psychiatric:        Mood and Affect: Mood normal.        Behavior: Behavior normal.       The results of significant diagnostics from this hospitalization (including imaging, microbiology, ancillary and laboratory) are listed below for reference.     Microbiology: Recent Results (from the past 240 hour(s))  Respiratory Panel by RT PCR (Flu A&B, Covid) - Nasopharyngeal Swab     Status: None   Collection Time: 02/03/19 10:40 PM   Specimen:  Nasopharyngeal Swab  Result Value Ref Range Status   SARS Coronavirus 2 by RT PCR NEGATIVE NEGATIVE Final    Comment: (NOTE) SARS-CoV-2 target nucleic acids are NOT DETECTED. The SARS-CoV-2 RNA is generally detectable in upper respiratoy specimens during the acute phase of infection. The lowest concentration of SARS-CoV-2 viral copies this assay can detect is 131 copies/mL. A negative result does not preclude SARS-Cov-2 infection and should not be used as the sole basis for treatment or other patient management decisions. A negative result may occur with  improper specimen collection/handling, submission of specimen other than nasopharyngeal swab, presence of viral mutation(s) within the areas targeted by this assay, and inadequate number of viral copies (<131 copies/mL). A negative result must be combined with clinical observations, patient history, and epidemiological information. The expected result is Negative.  Fact Sheet for Patients:  PinkCheek.be Fact Sheet for Healthcare Providers:  GravelBags.it This test is not yet ap proved or cleared by the [place] FDA and  has been authorized for detection and/or diagnosis of SARS-CoV-2 by FDA under an Emergency Use Authorization (EUA). This EUA will remain  in effect (meaning this test can be used) for the duration of the COVID-19 declaration under Section 564(b)(1) of the Act, 21 U.S.C. section 360bbb-3(b)(1), unless the authorization is terminated or revoked sooner.    Influenza A by PCR NEGATIVE NEGATIVE Final   Influenza B by PCR NEGATIVE NEGATIVE Final    Comment: (NOTE) The Xpert Xpress SARS-CoV-2/FLU/RSV assay is intended as an aid in  the diagnosis of influenza from Nasopharyngeal swab specimens and  should not be used as a sole basis for treatment. Nasal washings and  aspirates are unacceptable for Xpert Xpress SARS-CoV-2/FLU/RSV  testing. Fact Sheet for  Patients: PinkCheek.be Fact Sheet for Healthcare Providers: GravelBags.it This test is not yet approved or cleared by the [place] FDA and  has been authorized for detection and/or diagnosis of SARS-CoV-2 by  FDA under an Emergency Use Authorization (EUA). This EUA will remain  in effect (meaning this test can be used) for the duration of the  Covid-19 declaration under Section 564(b)(1) of the Act, 21  U.S.C. section 360bbb-3(b)(1), unless the authorization is  terminated or revoked. Performed at [facility], [place] [address] [place], [place] [address]      Labs: BNP (last 3 results) No results for input(s): BNP in the last 8760 hours. Basic Metabolic Panel: Recent Labs  Lab 02/03/19 2238 02/04/19 0403  NA 143 144  K 4.0 4.2  CL 104 108  CO2 17* 25  GLUCOSE 309* 78  BUN 18 19  CREATININE 1.61* 1.13  CALCIUM 9.5 8.9  MG 2.5*  --    Liver Function Tests: Recent Labs  Lab 02/03/19 2238  AST 24  ALT 15  ALKPHOS 65  BILITOT 0.3  PROT 7.8  ALBUMIN 4.4   No results for input(s): LIPASE, AMYLASE in the last 168 hours. No results for input(s): AMMONIA in the last 168 hours. CBC: Recent Labs  Lab 02/03/19 2238 02/04/19 0614  WBC 20.1* 17.4*  NEUTROABS 15.7* 15.3*  HGB 14.6 12.9*  HCT 46.7 39.0  MCV 101.7* 95.4  PLT 231 233   Cardiac Enzymes: Recent Labs  Lab 02/03/19 2239  CKTOTAL 154   BNP: Invalid input(s): POCBNP CBG: Recent Labs  Lab 02/04/19 1152  GLUCAP 78   D-Dimer No results for input(s): DDIMER in the last 72 hours. Hgb A1c Recent Labs    02/04/19 0403  HGBA1C 5.7*   Lipid Profile No results for input(s): CHOL, HDL, LDLCALC, TRIG, CHOLHDL, LDLDIRECT in the last 72 hours. Thyroid function studies Recent Labs    02/03/19 2239  TSH 1.589   Anemia work up No results for input(s): VITAMINB12, FOLATE, FERRITIN, TIBC, IRON, RETICCTPCT in the last 72  hours. Urinalysis    Component Value Date/Time   COLORURINE YELLOW 08/19/2017 [place] 08/19/2017 1353   LABSPEC 1.008 08/19/2017 1353   PHURINE 7.0 08/19/2017 1353   GLUCOSEU 50 (A) 08/19/2017 1353   HGBUR SMALL (A) 08/19/2017 1353   BILIRUBINUR NEGATIVE 08/19/2017 1353   KETONESUR NEGATIVE 08/19/2017 1353   PROTEINUR NEGATIVE 08/19/2017 1353   NITRITE NEGATIVE 08/19/2017 1353   LEUKOCYTESUR NEGATIVE 08/19/2017 1353   Sepsis Labs Invalid input(s): PROCALCITONIN,  WBC,  LACTICIDVEN Microbiology Recent  Results (from the past 240 hour(s))  Respiratory Panel by RT PCR (Flu A&B, Covid) - Nasopharyngeal Swab     Status: None   Collection Time: 02/03/19 10:40 PM   Specimen: Nasopharyngeal Swab  Result Value Ref Range Status   SARS Coronavirus 2 by RT PCR NEGATIVE NEGATIVE Final    Comment: (NOTE) SARS-CoV-2 target nucleic acids are NOT DETECTED. The SARS-CoV-2 RNA is generally detectable in upper respiratoy specimens during the acute phase of infection. The lowest concentration of SARS-CoV-2 viral copies this assay can detect is 131 copies/mL. A negative result does not preclude SARS-Cov-2 infection and should not be used as the sole basis for treatment or other patient management decisions. A negative result may occur with  improper specimen collection/handling, submission of specimen other than nasopharyngeal swab, presence of viral mutation(s) within the areas targeted by this assay, and inadequate number of viral copies (<131 copies/mL). A negative result must be combined with clinical observations, patient history, and epidemiological information. The expected result is Negative. Fact Sheet for Patients:  PinkCheek.be Fact Sheet for Healthcare Providers:  GravelBags.it This test is not yet ap proved or cleared by the [place] FDA and  has been authorized for detection and/or diagnosis of  SARS-CoV-2 by FDA under an Emergency Use Authorization (EUA). This EUA will remain  in effect (meaning this test can be used) for the duration of the COVID-19 declaration under Section 564(b)(1) of the Act, 21 U.S.C. section 360bbb-3(b)(1), unless the authorization is terminated or revoked sooner.    Influenza A by PCR NEGATIVE NEGATIVE Final   Influenza B by PCR NEGATIVE NEGATIVE Final    Comment: (NOTE) The Xpert Xpress SARS-CoV-2/FLU/RSV assay is intended as an aid in  the diagnosis of influenza from Nasopharyngeal swab specimens and  should not be used as a sole basis for treatment. Nasal washings and  aspirates are unacceptable for Xpert Xpress SARS-CoV-2/FLU/RSV  testing. Fact Sheet for Patients: PinkCheek.be Fact Sheet for Healthcare Providers: GravelBags.it This test is not yet approved or cleared by the [place] FDA and  has been authorized for detection and/or diagnosis of SARS-CoV-2 by  FDA under an Emergency Use Authorization (EUA). This EUA will remain  in effect (meaning this test can be used) for the duration of the  Covid-19 declaration under Section 564(b)(1) of the Act, 21  U.S.C. section 360bbb-3(b)(1), unless the authorization is  terminated or revoked. Performed at [facility], [place] [address] [place], [place] [address]     Discharge Instructions     Discharge Instructions    Diet - low sodium heart healthy   Complete by: As directed    Discharge instructions   Complete by: As directed    You were seen and examined in the hospital for heroin overdose and atrial fibrillation which is an abnormal heart rhythm and cared for by a hospitalist.   Upon Discharge:  -Stop using heroin or any other illicit drugs as this is very dangerous and can cause death -Start taking carvedilol 6.25 milligrams twice daily -Restart your home Maxide blood pressure medication tomorrow, 1/27 -Make an  appointment with your primary care physician within 7 days -Get lab work prior to your follow up appointment with your PCP  Bring all home medications to your appointment to review Request that your primary physician go over all hospital tests and procedures/radiological results at the follow up.   Please get all hospital records sent to your physician by signing a hospital release before you go  home.     Read the complete instructions along with all the possible side effects for all the medicines you take and that have been prescribed to you. Take any new medicines after you have completely understood and accept all the possible adverse reactions/side effects.   If you have any questions about your discharge medications or the care you received while you were in the hospital, you can call the unit and asked to speak with the hospitalist on call. Once you are discharged, your primary care physician will handle any further medical issues. Please note that NO REFILLS for any discharge medications will be authorized, as it is imperative that you return to your primary care physician (or establish a relationship with a primary care physician if you do not have one) for your aftercare needs so that they can reassess your need for medications and monitor your lab values.   Do not drive, operate heavy machinery, perform activities at heights, swimming or participation in water activities or provide baby sitting services if your were admitted for loss of consciousness/seizures or if you are on sedating medications including, but not limited to benzodiazepines, sleep medications, narcotic pain medications, etc., until you have been cleared to do so by a medical doctor.   Do not take more than prescribed medications.   Wear a seat belt while driving.  If you have smoked or chewed Tobacco in the last 2 years please stop smoking; also stop any regular Alcohol and/or any Recreational drug use including  marijuana.  If you experience worsening of your admission symptoms or develop shortness of breath, chest pain, suicidal or homicidal thoughts or experience a life threatening emergency, you must seek medical attention immediately by calling 911 or calling your PCP immediately.   Increase activity slowly   Complete by: As directed      Allergies as of 02/04/2019   No Known Allergies     Medication List    TAKE these medications   carvedilol 6.25 MG tablet Commonly known as: Coreg Take 1 tablet (6.25 mg total) by mouth 2 (two) times daily with a meal.   triamterene-hydrochlorothiazide 37.5-25 MG tablet Commonly known as: MAXZIDE-25 Take 1 tablet by mouth daily.      Follow-up [facility] Follow up.   Contact information: [address] [place] [phone]         No Known Allergies  Time coordinating discharge: Over 30 minutes   SIGNED:   Harold Hedge, D.O. [facility] Pager: [phone]  02/04/2019, 2:43 PM

## 2019-02-04 NOTE — TOC Initial Note (Signed)
Transition of Care [facility]) - Initial/Assessment Note    Patient Details  Name: Bryan Herring MRN: [id number] Date of Birth: 06/02/1960  [facility] CM/SW Contact:    Bethena Roys, RN Phone Number: 02/04/2019, 2:15 PM  Clinical Narrative:  Patient presented for atrial fib-patient is without insurance at this time. Patient is currently unemployed. He lives with his sister and he uses the bus for transportation. Patient was being seen at the [facility]), however he has not been able to go due to lack of money. Case Manager will schedule a hospital follow up appointment at the clinic. Patient is aware to get medications from the [facility] in the future- cost will be $4.00-$10.00. Patient will have his medications filled via the transition of care pharmacy today before he leaves and have medications will be delivered to the bedside. Patient will call his brother for transportation home. Clinical Social Worker provided the patient information on outpatient substance abuse. No further needs from Case Manager at this time.               Expected Discharge Plan: Home/Self Care Barriers to Discharge: No Barriers Identified   Patient Goals and CMS Choice Patient states their goals for this hospitalization and ongoing recovery are:: "to return home"   Choice offered to / list presented to : NA  Expected Discharge Plan and Services Expected Discharge Plan: Home/Self Care In-house Referral: NA Discharge Planning Services: CM Consult, Follow-up appt scheduled, Medication Assistance, [place] Acute Care Choice: NA Living arrangements for the past 2 months: Apartment                   HH Arranged: NA    Prior Living Arrangements/Services Living arrangements for the past 2 months: Apartment Lives with:: Siblings Patient language and need for interpreter reviewed:: Yes Do you feel safe going back to the place  where you live?: Yes      Need for Family Participation in Patient Care: Yes (Comment) Care giver support system in place?: Yes (comment)   Criminal Activity/Legal Involvement Pertinent to Current Situation/Hospitalization: No - Comment as needed  Activities of Daily Living Home Assistive Devices/Equipment: None ADL Screening (condition at time of admission) Patient's cognitive ability adequate to safely complete daily activities?: Yes Is the patient deaf or have difficulty hearing?: No Does the patient have difficulty seeing, even when wearing glasses/contacts?: No Does the patient have difficulty concentrating, remembering, or making decisions?: No Patient able to express need for assistance with ADLs?: Yes Does the patient have difficulty dressing or bathing?: No Independently performs ADLs?: Yes (appropriate for developmental age) Does the patient have difficulty walking or climbing stairs?: No Weakness of Legs: None Weakness of Arms/Hands: None  Permission Sought/Granted Permission sought to share information with : Family Supports, Chartered certified accountant granted to share information with : Yes, Verbal Permission Granted     Permission granted to share info w AGENCY: Scientist, research (physical sciences) and [facility]        Emotional Assessment Appearance:: Appears stated age Attitude/Demeanor/Rapport: Engaged Affect (typically observed): Appropriate Orientation: : Oriented to Situation, Oriented to  Time, Oriented to Place, Oriented to Self Alcohol / Substance Use: Alcohol Use, Illicit Drugs(Social Worker has provided the patient with outpatient substance abuse information.) Psych Involvement: No (comment)  Admission diagnosis:  Atrial fibrillation with RVR ([place]) [I48.91] Patient Active Problem List   Diagnosis Date Noted  . Atrial fibrillation with RVR ([place]) 02/04/2019  .  Overdose opiate, accidental or unintentional, initial encounter ([place]) 02/04/2019  . AKI  (acute kidney injury) ([place]) 02/04/2019  . Leukocytosis 02/04/2019  . LVH (left ventricular hypertrophy) 03/01/2018  . Syncope 03/01/2018  . Essential hypertension 03/01/2018   PCP:  Antony Blackbird, MD Pharmacy:   [facility] [place], [place] AT [address] [place] [address] Phone: [phone] Fax: [phone]  [facility] [place], [place] - [address] [place] [address] Phone: [phone] Fax: [phone]     Social Determinants of Health (SDOH) Interventions    Readmission Risk Interventions No flowsheet data found.

## 2019-02-04 NOTE — H&P (Addendum)
History and Physical    Bryan Herring [id number] DOB: 1960/10/22 DOA: 02/03/2019  PCP: Antony Blackbird, MD   Patient coming from: Home   Chief Complaint: Unresponsive   HPI: Bryan Herring is a 59 y.o. male with medical history significant for hypertension, history of syncope, and heroin abuse, now presenting to the emergency department after he was found unresponsive.  EMS was called due to the patient being unresponsive, he was found outside in a puddle with pinpoint pupils.  Narcan was administered by EMS, patient woke up and became combative, and was brought into the ED.  By time of admission, the patient is alert, fully oriented, and able to provide a thorough history, reporting that he had been in his usual state of health earlier in the day, had just received his unemployment benefits and decided to "celebrate" by doing some heroin.  He reports that he had been maintaining abstinence from illicit substances recently, does not feel that he is addicted or needs any help, and is now planning not to use again.  He denies any recent fevers, chills, chest pain, palpitations, headache, change in vision or hearing, or focal numbness or weakness.  He denies any recent alcohol use and denies any other drug use. Patient unsure if he has had atrial fibrillation before.   ED Course: Upon arrival to the ED, patient is found to be hypothermic at 31.4 C rectally, saturating 90% on room air, tachycardic to the 150s, and with stable blood pressure.  EKG features atrial fibrillation with RVR.  Chest x-ray negative for acute cardiopulmonary disease.  Chemistry panel notable for glucose of 309, creatinine 1.61, and bicarbonate still pending.  CBC notable for leukocytosis to 20,100.  CK level is normal.  Patient was given a liter of IV fluids in the ED, IV Lopressor pushes, ketamine initially, and warming blanket.  COVID-19 PCR is negative. Hospitalists consulted for admission.   Review of  Systems:  All other systems reviewed and apart from HPI, are negative.  Past Medical History:  Diagnosis Date  . Deaf    left ear  . Essential hypertension 03/01/2018    Past Surgical History:  Procedure Laterality Date  . ACHILLES TENDON SURGERY       reports that he has been smoking cigarettes. He has been smoking about 0.25 packs per day. He has never used smokeless tobacco. He reports current alcohol use. He reports current drug use. Drugs: Marijuana and Heroin.  No Known Allergies  Family History  Problem Relation Age of Onset  . Aneurysm Mother   . Hypertension Brother   . Hypertension Maternal Aunt      Prior to Admission medications   Medication Sig Start Date End Date Taking? Authorizing Provider  mupirocin ointment (BACTROBAN) 2 % Apply twice daily to affected skin area x 7 days 02/13/18   [name], Cammie, MD  triamterene-hydrochlorothiazide (MAXZIDE-25) 37.5-25 MG tablet Take 1 tablet by mouth daily. To lower blood pressure 02/13/18   Antony Blackbird, MD    Physical Exam: Vitals:   02/03/19 2330 02/03/19 2345 02/04/19 0021 02/04/19 0022  BP: 90/67 119/89  (!) 126/92  Pulse:    (!) 108  Resp: 18 20 13 18   Temp:      TempSrc:      SpO2:  100% 97% 100%    Constitutional: NAD, calm  Eyes: PERTLA, lids and conjunctivae normal ENMT: Mucous membranes are moist. Posterior pharynx clear of any exudate or lesions.   Neck: normal, supple, no masses,  no thyromegaly Respiratory: no wheezing, no crackles. No accessory muscle use.  Cardiovascular: S1 & S2 heard, regular rate and rhythm. No extremity edema. No significant JVD. Abdomen: No distension, no tenderness, soft. Bowel sounds active.  Musculoskeletal: no clubbing / cyanosis. No joint deformity upper and lower extremities.   Skin: no significant rashes, lesions, ulcers. Warm, dry, well-perfused. Neurologic: CN 2-12 grossly intact. Sensation intact. Strength 5/5 in all 4 limbs.  Psychiatric: Alert and oriented to person,  place, and situation. Pleasant and cooperative.    Labs and Imaging on Admission: I have personally reviewed following labs and imaging studies  CBC: Recent Labs  Lab 02/03/19 2238  WBC 20.1*  NEUTROABS 15.7*  HGB 14.6  HCT 46.7  MCV 101.7*  PLT [id number]   Basic Metabolic Panel: Recent Labs  Lab 02/03/19 2238  NA 143  K 4.0  CL 104  CO2 PENDING  GLUCOSE 309*  BUN 18  CREATININE 1.61*  CALCIUM 9.5  MG 2.5*   GFR: CrCl cannot be calculated (Unknown ideal weight.). Liver Function Tests: Recent Labs  Lab 02/03/19 2238  AST 24  ALT 15  ALKPHOS 65  BILITOT 0.3  PROT 7.8  ALBUMIN 4.4   No results for input(s): LIPASE, AMYLASE in the last 168 hours. No results for input(s): AMMONIA in the last 168 hours. Coagulation Profile: Recent Labs  Lab 02/03/19 2238  INR 1.1   Cardiac Enzymes: Recent Labs  Lab 02/03/19 2239  CKTOTAL 154   BNP (last 3 results) No results for input(s): PROBNP in the last 8760 hours. HbA1C: No results for input(s): HGBA1C in the last 72 hours. CBG: No results for input(s): GLUCAP in the last 168 hours. Lipid Profile: No results for input(s): CHOL, HDL, LDLCALC, TRIG, CHOLHDL, LDLDIRECT in the last 72 hours. Thyroid Function Tests: Recent Labs    02/03/19 2239  TSH 1.589   Anemia Panel: No results for input(s): VITAMINB12, FOLATE, FERRITIN, TIBC, IRON, RETICCTPCT in the last 72 hours. Urine analysis:    Component Value Date/Time   COLORURINE YELLOW 08/19/2017 [place] 08/19/2017 1353   LABSPEC 1.008 08/19/2017 1353   PHURINE 7.0 08/19/2017 1353   GLUCOSEU 50 (A) 08/19/2017 1353   HGBUR SMALL (A) 08/19/2017 1353   BILIRUBINUR NEGATIVE 08/19/2017 1353   KETONESUR NEGATIVE 08/19/2017 1353   PROTEINUR NEGATIVE 08/19/2017 1353   NITRITE NEGATIVE 08/19/2017 1353   LEUKOCYTESUR NEGATIVE 08/19/2017 1353   Sepsis Labs: @LABRCNTIP (procalcitonin:4,lacticidven:4) ) Recent Results (from the past 240 hour(s))   Respiratory Panel by RT PCR (Flu A&B, Covid) - Nasopharyngeal Swab     Status: None   Collection Time: 02/03/19 10:40 PM   Specimen: Nasopharyngeal Swab  Result Value Ref Range Status   SARS Coronavirus 2 by RT PCR NEGATIVE NEGATIVE Final    Comment: (NOTE) SARS-CoV-2 target nucleic acids are NOT DETECTED. The SARS-CoV-2 RNA is generally detectable in upper respiratoy specimens during the acute phase of infection. The lowest concentration of SARS-CoV-2 viral copies this assay can detect is 131 copies/mL. A negative result does not preclude SARS-Cov-2 infection and should not be used as the sole basis for treatment or other patient management decisions. A negative result may occur with  improper specimen collection/handling, submission of specimen other than nasopharyngeal swab, presence of viral mutation(s) within the areas targeted by this assay, and inadequate number of viral copies (<131 copies/mL). A negative result must be combined with clinical observations, patient history, and epidemiological information. The expected result is Negative. Fact Sheet for  Patients:  PinkCheek.be Fact Sheet for Healthcare Providers:  GravelBags.it This test is not yet ap proved or cleared by the [place] and  has been authorized for detection and/or diagnosis of SARS-CoV-2 by FDA under an Emergency Use Authorization (EUA). This EUA will remain  in effect (meaning this test can be used) for the duration of the COVID-19 declaration under Section 564(b)(1) of the Act, 21 U.S.C. section 360bbb-3(b)(1), unless the authorization is terminated or revoked sooner.    Influenza A by PCR NEGATIVE NEGATIVE Final   Influenza B by PCR NEGATIVE NEGATIVE Final    Comment: (NOTE) The Xpert Xpress SARS-CoV-2/FLU/RSV assay is intended as an aid in  the diagnosis of influenza from Nasopharyngeal swab specimens and  should not be used as a sole  basis for treatment. Nasal washings and  aspirates are unacceptable for Xpert Xpress SARS-CoV-2/FLU/RSV  testing. Fact Sheet for Patients: PinkCheek.be Fact Sheet for Healthcare Providers: GravelBags.it This test is not yet approved or cleared by the [place] FDA and  has been authorized for detection and/or diagnosis of SARS-CoV-2 by  FDA under an Emergency Use Authorization (EUA). This EUA will remain  in effect (meaning this test can be used) for the duration of the  Covid-19 declaration under Section 564(b)(1) of the Act, 21  U.S.C. section 360bbb-3(b)(1), unless the authorization is  terminated or revoked. Performed at [facility], [place] [address] [place], [place] [address]      Radiological Exams on Admission: CT Head Wo Contrast  Result Date: 02/04/2019 CLINICAL DATA:  Found unresponsive with pinpoint pupils, cerebral hemorrhage suspected EXAM: CT HEAD WITHOUT CONTRAST TECHNIQUE: Contiguous axial images were obtained from the base of the skull through the vertex without intravenous contrast. COMPARISON:  CT head August 19, 2017 FINDINGS: Brain: No evidence of acute infarction, hemorrhage, hydrocephalus, extra-axial collection or mass lesion/mass effect. Benign dural calcifications. Vascular: Small amount of gas in the right cavernous sinus, possibly iatrogenic/related to intravenous access. No hyperdense vessels. Minimal calcific plaque in the carotid siphons. Skull: No calvarial fracture or suspicious osseous lesion. No scalp swelling or hematoma. Sinuses/Orbits: Paranasal sinuses and mastoid air cells are predominantly clear. Included orbital structures are unremarkable. Other: Additional foci of likely venous gas in the masticator space. IMPRESSION: Punctate foci of gas present within the right cavernous sinus, with additional foci of likely venous gas in the masticator space. Possibly iatrogenic/related to  intravenous access. No other acute intracranial abnormality. Electronically Signed   By: Lovena Le M.D.   On: 02/04/2019 00:14   DG Chest Portable 1 View  Result Date: 02/03/2019 CLINICAL DATA:  Altered mental status. EXAM: PORTABLE CHEST 1 VIEW COMPARISON:  None. FINDINGS: The lungs are hyperinflated. Extensive bullous disease is seen throughout the right upper lobe. There is no evidence of acute infiltrate, pleural effusion or pneumothorax. The heart size and mediastinal contours are within normal limits. The visualized skeletal structures are unremarkable. IMPRESSION: No active disease. Electronically Signed   By: Virgina Norfolk M.D.   On: 02/03/2019 22:34    EKG: Independently reviewed. Atrial fibrillation, rate 196, PVC, IVCD.   Assessment/Plan   1. Atrial fibrillation with RVR  - Patient in atrial fibrillation in ED with rates in high 100's, improved to low 100's after 2 doses of IV Lopressor  - No known history of a fib, TSH and mag level normal, possibly triggered by the overdose and subsequent hypothermia  - CHADS-VASc may be only 1 for HTN  - Continue cardiac monitoring,  use Lopressor IVP's as needed, hold anticoagulation for now in light of low risk score and likelihood this was precipitated by his hypothermia   2. Heroin overdose  - Presents after unintentional heroin overdose, woke with Narcan prior to arrival and is alert and fully oriented by time of admission  - Patient reports that he had been abstaining but wanted to "treat" himself after receiving some money, demonstrates remorse for the relapse and does not believe that he will need any help with abstinence going forward    3. Acute kidney injury  - SCr is 1.61 in ED, up from 0.8 last year  - Check urine chemistries, renally-dose medications, continue IVF hydration, repeat chem panel in am   4. Hypertension  - BP at goal  - Hold Maxzide in light of AKI    5. Hyperglycemia  - Serum glucose is 309 in ED  -  Secondary to acute stress reaction vs undiagnosed DM  - Check A1c, monitor CBG's and use a low-intensity SSI with Novolog as needed    DVT prophylaxis: Lovenox  Code Status: Full  Family Communication: Discussed with patient  Consults called: None  Admission status: Observation    Vianne Bulls, MD [facility] Pager: See [email or web address]  If 7AM-7PM, please contact the daytime attending [email or web address]  02/04/2019, 12:44 AM

## 2019-02-05 LAB — UREA NITROGEN, URINE: Urea Nitrogen, Ur: 1159 mg/dL

## 2019-02-12 ENCOUNTER — Inpatient Hospital Stay: Payer: Self-pay

## 2019-02-19 ENCOUNTER — Inpatient Hospital Stay: Payer: Self-pay

## 2020-02-12 ENCOUNTER — Telehealth: Payer: Self-pay

## 2020-02-12 NOTE — Telephone Encounter (Signed)
Copied from [place] [id number]. Topic: Medical Record Request - Provider/Facility Request >> Feb 05, 2020 11:53 AM Erick Blinks wrote: Patient Name/DOB/MRN #: Bryan Herring / 03/21/1960 / [id number] Requestor Name/Agency: [facility], Disability determination services  Call Back #: [phone] extension [phone] Information Requested: This office only received pages 1-34 of documents out 33, requesting refax of medical records   Fax: [phone]   Route to [facility] for [place] clinics. For all other clinics, route to the clinic's PEC Pool.

## 2020-04-20 ENCOUNTER — Other Ambulatory Visit (HOSPITAL_COMMUNITY): Payer: Self-pay

## 2020-04-28 ENCOUNTER — Other Ambulatory Visit: Payer: Self-pay

## 2020-04-28 ENCOUNTER — Ambulatory Visit: Payer: Self-pay | Admitting: Critical Care Medicine

## 2020-04-28 VITALS — BP 164/113 | HR 78 | Temp 98.1°F | Resp 18 | Ht 72.0 in | Wt 130.0 lb

## 2020-04-28 DIAGNOSIS — I10 Essential (primary) hypertension: Secondary | ICD-10-CM

## 2020-04-28 DIAGNOSIS — N179 Acute kidney failure, unspecified: Secondary | ICD-10-CM

## 2020-04-28 DIAGNOSIS — B070 Plantar wart: Secondary | ICD-10-CM | POA: Insufficient documentation

## 2020-04-28 DIAGNOSIS — D72829 Elevated white blood cell count, unspecified: Secondary | ICD-10-CM

## 2020-04-28 MED ORDER — CARVEDILOL 12.5 MG PO TABS
12.5000 mg | ORAL_TABLET | Freq: Two times a day (BID) | ORAL | 1 refills | Status: DC
  Filled 2020-04-28: qty 60, 30d supply, fill #0

## 2020-04-28 MED ORDER — VALSARTAN-HYDROCHLOROTHIAZIDE 160-25 MG PO TABS
1.0000 | ORAL_TABLET | Freq: Every day | ORAL | 3 refills | Status: DC
  Filled 2020-04-28: qty 90, 90d supply, fill #0

## 2020-04-28 MED ORDER — SALICYLIC ACID 0.5 % EX LOTN: TOPICAL_LOTION | CUTANEOUS | 0 refills | Status: DC

## 2020-04-28 MED ORDER — MELATONIN 5 MG PO TABS: 10.0000 mg | ORAL_TABLET | Freq: Every day | ORAL | 1 refills | Status: DC

## 2020-04-28 MED ORDER — VALSARTAN-HYDROCHLOROTHIAZIDE 160-25 MG PO TABS: 1.0000 | ORAL_TABLET | Freq: Every day | ORAL | 3 refills | Status: DC

## 2020-04-28 MED ORDER — MELATONIN 5 MG PO TABS
10.0000 mg | ORAL_TABLET | Freq: Every day | ORAL | 1 refills | Status: DC
  Filled 2020-04-28: qty 60, 30d supply, fill #0

## 2020-04-28 MED ORDER — SALICYLIC ACID 0.5 % EX LOTN
TOPICAL_LOTION | CUTANEOUS | 0 refills | Status: DC
  Filled 2020-04-28: qty 120, fill #0

## 2020-04-28 NOTE — Progress Notes (Signed)
Subjective:    Patient ID: Bryan Herring, male    DOB: 03/15/1960, 60 y.o.   MRN: [id number]  59 y.o.M former Bryan Herring PCP pt  04/28/2020 Seen on [facility] for BP elevation and trouble sleeping.  In [facility] program. This patient complains of weight loss chronic pain in the feet with callus formation in the feet as well as plantars warts.  He also is complaining of hypertension and blood pressures in the rehab center have been quite elevated.  He is not on blood pressure medications at this time.  He was overdosing 4-5 times in the past on heroin and he chose to enter the [facility] rehab.  He has yet to achieve the orange card.  He is interested in a podiatry referral.  This is a former patient of Dr. Chapman Herring last seen in 2020.  Patient was admitted in January of this year for atrial fibrillation.  Below is copy to discharge summary. Admit date: 02/03/2019 Discharge date: 02/04/2019   Code Status: Full Code  Admitted From: home Discharged to:  home Home Health:none  Equipment/Devices:none  Discharge Condition:stable   Recommendations for Outpatient Follow-up  1. Patient is to be set up with PCP appointment 2. Please follow up BMP/CBC 3. Started on Coreg 6.25 mg twice daily due to hypotension and sinus tachycardia 4. Not started on anticoagulation due to low CHA2DS2-VASc 5. Continue to encourage illicit drug abstinence 6. Recommend dental appointment  Hospital Summary Bryan Ivey Hemingwayis a 60 y.o.malewith medical history significant forhypertension, history of syncope, and heroin abuse, who presented to the emergency department after he was found unresponsive. EMS was called due to the patient being unresponsive, he was found outside in a puddle with pinpoint pupils. Narcan was administered by EMS, patient woke up and became combative, and was brought into the ED. By time of admission, the patient was alert, fully oriented, and able to provide a thorough history, reporting  that he had been in his usual state of health earlier in the day, had just received his unemployment benefits and decided to "celebrate" by doing some heroin. He reported that he had beenmaintainingabstinence from illicit substances recently, does not feel that he is addicted or needs any help, and is now planning not to use again. He denies any recent fevers, chills, chest pain, palpitations, headache, change in vision or hearing, or focal numbness or weakness. He denied any recent alcohol use and denies any other drug use. Patient unsure if he has had atrial fibrillation before.  ED Course:Upon arrival to the ED, patient was found to be hypothermic at 31.4Crectally,saturating 90% on room air, tachycardic to the 150s, and with stable blood pressure. EKG features atrial fibrillation with RVR. Chest x-ray negative for acute cardiopulmonary disease. Chemistry panel notable for glucose of 309, creatinine 1.61, and bicarbonate still pending. CBC notable for leukocytosis to 20,100. CK level is normal. Patient was given a liter of IV fluids in the ED, IV Lopressor pushes, ketamine initially, and warming blanket. COVID-19 PCR is negative.    The following day patient had resolution of his symptoms.  Echo performed which showed EF 40 to 45% with grade 1 diastolic dysfunction.  He was discharged in stable condition with addition of Coreg and no anticoagulation due to low risk CHA2DS2-VASc 1 and advised to abstain from illicit drug use.  A & P  Principal Problem:   Atrial fibrillation with RVR (HCC) Active Problems:   Essential hypertension   Overdose opiate, accidental or unintentional, initial encounter ([place])  AKI (acute kidney injury) ([place])   Leukocytosis   1. New onset A. fib with RVR in setting of heroin overdose 1. Returned to sinus rhythm with IV fluids and two doses of lopressor 2. TSH and Mg normal 3. Echo: EF 40 to 45% with grade 1 diastolic  dysfunction 4. CHA2DS2-VASc: 1 5. Advised to follow-up with PCP and avoid illicit drug use 2. Heroin overdose status post Narcan x1 1. No agreed to abstain from any illicit drugs at this time 3. Compensated HFpEF 1. Echo as above 4. AKI likely from hemodynamic changes (from atrial fibrillation and heroin overdose) 1. Improved with IV fluids and holding Maxzide 2. Hold Maxzide one more day and get repeat BMP outpatient 5. Hypertension 1. Resume Maxide tomorrow to help renal function improve 6. Reactive leukocytosis, improving 1. Repeat CBC outpatient 7. Hyperglycemia, resolved  Patient's not had any labs since that visit. Another issue is that of insomnia as well.  He had acute kidney injury in the hospitalization he needs follow-up on the labs.  He is only taking Maxide that is the only medicine he is on now he is off his beta-blocker he ran out.  Past Medical History:  Diagnosis Date  . Deaf    left ear  . Essential hypertension 03/01/2018     Family History  Problem Relation Age of Onset  . Aneurysm Mother   . Hypertension Brother   . Hypertension Maternal Aunt      Social History   Socioeconomic History  . Marital status: Single    Spouse name: Not on file  . Number of children: Not on file  . Years of education: Not on file  . Highest education level: Not on file  Occupational History  . Not on file  Tobacco Use  . Smoking status: Current Every Day Smoker    Packs/day: 0.25    Types: Cigarettes  . Smokeless tobacco: Never Used  Vaping Use  . Vaping Use: Never used  Substance and Sexual Activity  . Alcohol use: Yes    Comment: 3 times a week  . Drug use: Yes    Types: Marijuana, Heroin    Comment: heroin   . Sexual activity: Not Currently  Other Topics Concern  . Not on file  Social History Narrative  . Not on file   Social Determinants of Health   Financial Resource Strain: Not on file  Food Insecurity: Not on file  Transportation Needs: Not on  file  Physical Activity: Not on file  Stress: Not on file  Social Connections: Not on file  Intimate Partner Violence: Not on file     No Known Allergies   Outpatient Medications Prior to Visit  Medication Sig Dispense Refill  . acetaminophen (TYLENOL) 500 MG tablet Take 500 mg by mouth every 6 (six) hours as needed.    . docusate sodium (COLACE) 100 MG capsule Take 100 mg by mouth daily as needed for mild constipation.    . calamine lotion Apply 1 application topically as needed for itching.    . carvedilol (COREG) 6.25 MG tablet Take 1 tablet (6.25 mg total) by mouth 2 (two) times daily with a meal. 60 tablet 1  . triamterene-hydrochlorothiazide (MAXZIDE-25) 37.5-25 MG tablet Take 1 tablet by mouth daily. 30 tablet 1   No facility-administered medications prior to visit.     Review of Systems  HENT: Negative.   Respiratory: Negative.   Cardiovascular: Negative.   Gastrointestinal: Negative.   Musculoskeletal: Negative.  Neurological: Negative.   Hematological: Negative.   Psychiatric/Behavioral: Positive for sleep disturbance. The patient is nervous/anxious.        Objective:   Physical Exam  Vitals:   04/28/20 0906  BP: (!) 164/113  Pulse: 78  Resp: 18  Temp: 98.1 F (36.7 C)  TempSrc: Oral  SpO2: 100%  Weight: 130 lb (59 kg)  Height: 6' (1.829 m)    Gen: Pleasant, thin , in no distress,  normal affect  ENT: No lesions,  mouth clear,  oropharynx clear, no postnasal drip, very poor dentition with multiple carious teeth and severe periodontal disease  Neck: No JVD, no TMG, no carotid bruits  Lungs: No use of accessory muscles, no dullness to percussion, clear without rales or rhonchi  Cardiovascular: RRR, heart sounds normal, no murmur or gallops, no peripheral edema  Abdomen: soft and NT, no HSM,  BS normal  Musculoskeletal: No deformities, no cyanosis or clubbing  Neuro: alert, non focal  Skin: Warm, no lesions or rashes Foot exam was performed  and shows plantars warts at the bases of both feet and also callus formation in the midfoot the patient's feet are extremely flat  There is also onychomycosis of the toenails  No results found.       Assessment & Plan:  I personally reviewed all images and lab data in the [facility] system as well as any outside material available during this office visit and agree with the  radiology impressions.   Essential hypertension Blood pressure elevated on arrival we will begin Coreg 12.5 mg twice daily and begin valsartan HCT 160/25 1 daily    Plantar warts We will give a trial of topical salicylic acid lotion and we will endeavor to get this patient the orange card so we can get into podiatry  Also recommended to get a good skin moisturizer for the feet  AKI (acute kidney injury) ([place]) Follow-up renal function  Leukocytosis Follow-up CBC   Diagnoses and all orders for this visit:  Essential hypertension -     Comprehensive metabolic panel -     CBC with Differential/Platelet; Future -     CBC with Differential/Platelet  AKI (acute kidney injury) ([place]) -     Comprehensive metabolic panel  Leukocytosis, unspecified type -     CBC with Differential/Platelet; Future -     CBC with Differential/Platelet  Plantar warts  Other orders -     carvedilol (COREG) 12.5 MG tablet; Take 1 tablet (12.5 mg total) by mouth 2 (two) times daily with a meal. -     Discontinue: valsartan-hydrochlorothiazide (DIOVAN-HCT) 160-25 MG tablet; Take 1 tablet by mouth daily. -     Discontinue: melatonin 5 MG TABS; Take 2 tablets (10 mg total) by mouth at bedtime. -     Discontinue: Salicylic Acid 0.5 % LOTN; Apply to plantars warts twice a day -     melatonin 5 MG TABS; Take 2 tablets (10 mg total) by mouth at bedtime. -     Salicylic Acid 0.5 % LOTN; Apply to plantars warts twice a day -     valsartan-hydrochlorothiazide (DIOVAN-HCT) 160-25 MG tablet; Take 1 tablet by mouth daily.   We will endeavor to  get this patient into the clinic for follow-up  For insomnia will prescribe melatonin 10 mg at bedtime

## 2020-04-28 NOTE — Patient Instructions (Signed)
Labs will be obtained today  Begin Coreg 1 tablet twice daily for blood pressure Stop Maxide and begin valsartan HCT 1 daily for blood pressure  Take melatonin 10 mg which is 2 5 mg tablets 1 to 2 hours before going to bed  Use salicylic acid lotion applied to the bottom of the feet where the plantars warts are twice daily  Use a good foot moisturizer such as [place] bees products or any other foot moisturizer available and apply to your feet twice daily  An appointment with Dr. Joya Gaskins will be made in 1 month and we will give you financial assistance application as well for orange card

## 2020-04-28 NOTE — Assessment & Plan Note (Signed)
Follow-up CBC ?

## 2020-04-28 NOTE — Assessment & Plan Note (Signed)
Blood pressure elevated on arrival we will begin Coreg 12.5 mg twice daily and begin valsartan HCT 160/25 1 daily

## 2020-04-28 NOTE — Assessment & Plan Note (Signed)
We will give a trial of topical salicylic acid lotion and we will endeavor to get this patient the orange card so we can get into podiatry  Also recommended to get a good skin moisturizer for the feet

## 2020-04-28 NOTE — Progress Notes (Signed)
Patient verified DOB Patient has eaten and taken medication today and reports elevated BP last night. Patient complains of trouble sleeping since being in the [facility] program. Patient complains

## 2020-04-28 NOTE — Assessment & Plan Note (Signed)
Follow-up renal function 

## 2020-04-29 LAB — CBC WITH DIFFERENTIAL/PLATELET
Basophils Absolute: 0 10*3/uL (ref 0.0–0.2)
Basos: 0 %
EOS (ABSOLUTE): 0.2 10*3/uL (ref 0.0–0.4)
Eos: 3 %
Hematocrit: 39.3 % (ref 37.5–51.0)
Hemoglobin: 12.9 g/dL — ABNORMAL LOW (ref 13.0–17.7)
Immature Grans (Abs): 0 10*3/uL (ref 0.0–0.1)
Immature Granulocytes: 0 %
Lymphocytes Absolute: 1.7 10*3/uL (ref 0.7–3.1)
Lymphs: 25 %
MCH: 30.9 pg (ref 26.6–33.0)
MCHC: 32.8 g/dL (ref 31.5–35.7)
MCV: 94 fL (ref 79–97)
Monocytes Absolute: 0.6 10*3/uL (ref 0.1–0.9)
Monocytes: 9 %
Neutrophils Absolute: 4.3 10*3/uL (ref 1.4–7.0)
Neutrophils: 63 %
Platelets: 244 10*3/uL (ref 150–450)
RBC: 4.18 x10E6/uL (ref 4.14–5.80)
RDW: 12.4 % (ref 11.6–15.4)
WBC: 6.8 10*3/uL (ref 3.4–10.8)

## 2020-04-29 LAB — COMPREHENSIVE METABOLIC PANEL
ALT: 30 IU/L (ref 0–44)
AST: 15 IU/L (ref 0–40)
Albumin/Globulin Ratio: 1.4 (ref 1.2–2.2)
Albumin: 4.3 g/dL (ref 3.8–4.9)
Alkaline Phosphatase: 73 IU/L (ref 44–121)
BUN/Creatinine Ratio: 17 (ref 9–20)
BUN: 16 mg/dL (ref 6–24)
Bilirubin Total: 0.3 mg/dL (ref 0.0–1.2)
CO2: 22 mmol/L (ref 20–29)
Calcium: 10.3 mg/dL — ABNORMAL HIGH (ref 8.7–10.2)
Chloride: 99 mmol/L (ref 96–106)
Creatinine, Ser: 0.93 mg/dL (ref 0.76–1.27)
Globulin, Total: 3.1 g/dL (ref 1.5–4.5)
Glucose: 89 mg/dL (ref 65–99)
Potassium: 4.1 mmol/L (ref 3.5–5.2)
Sodium: 140 mmol/L (ref 134–144)
Total Protein: 7.4 g/dL (ref 6.0–8.5)
eGFR: 95 mL/min/{1.73_m2} (ref >59)

## 2020-04-30 ENCOUNTER — Telehealth: Payer: Self-pay

## 2020-04-30 NOTE — Telephone Encounter (Signed)
Message states that call can not be completed at this time.  All results were normal.

## 2020-04-30 NOTE — Telephone Encounter (Signed)
-----   Message from Elsie Stain, MD sent at 04/29/2020  8:58 AM EDT ----- Let pt know his liver and kidney are normal , blood counts all normal

## 2020-06-13 NOTE — Progress Notes (Deleted)
Subjective:    Patient ID: Bryan Herring, male    DOB: 03/15/1960, 60 y.o.   MRN: [id number]  59 y.o.M former [name] PCP pt  04/28/2020 Seen on [facility] for BP elevation and trouble sleeping.  In [facility] program. This patient complains of weight loss chronic pain in the feet with callus formation in the feet as well as plantars warts.  He also is complaining of hypertension and blood pressures in the rehab center have been quite elevated.  He is not on blood pressure medications at this time.  He was overdosing 4-5 times in the past on heroin and he chose to enter the [facility] rehab.  He has yet to achieve the orange card.  He is interested in a podiatry referral.  This is a former patient of Dr. Chapman Fitch last seen in 2020.  Patient was admitted in January of this year for atrial fibrillation.  Below is copy to discharge summary. Admit date: 02/03/2019 Discharge date: 02/04/2019   Code Status: Full Code  Admitted From: home Discharged to:  home Home Health:none  Equipment/Devices:none  Discharge Condition:stable   Recommendations for Outpatient Follow-up  1. Patient is to be set up with PCP appointment 2. Please follow up BMP/CBC 3. Started on Coreg 6.25 mg twice daily due to hypotension and sinus tachycardia 4. Not started on anticoagulation due to low CHA2DS2-VASc 5. Continue to encourage illicit drug abstinence 6. Recommend dental appointment  Hospital Summary Bryan Ivey Hemingwayis a 60 y.o.malewith medical history significant forhypertension, history of syncope, and heroin abuse, who presented to the emergency department after he was found unresponsive. EMS was called due to the patient being unresponsive, he was found outside in a puddle with pinpoint pupils. Narcan was administered by EMS, patient woke up and became combative, and was brought into the ED. By time of admission, the patient was alert, fully oriented, and able to provide a thorough history, reporting  that he had been in his usual state of health earlier in the day, had just received his unemployment benefits and decided to "celebrate" by doing some heroin. He reported that he had beenmaintainingabstinence from illicit substances recently, does not feel that he is addicted or needs any help, and is now planning not to use again. He denies any recent fevers, chills, chest pain, palpitations, headache, change in vision or hearing, or focal numbness or weakness. He denied any recent alcohol use and denies any other drug use. Patient unsure if he has had atrial fibrillation before.  ED Course:Upon arrival to the ED, patient was found to be hypothermic at 31.4Crectally,saturating 90% on room air, tachycardic to the 150s, and with stable blood pressure. EKG features atrial fibrillation with RVR. Chest x-ray negative for acute cardiopulmonary disease. Chemistry panel notable for glucose of 309, creatinine 1.61, and bicarbonate still pending. CBC notable for leukocytosis to 20,100. CK level is normal. Patient was given a liter of IV fluids in the ED, IV Lopressor pushes, ketamine initially, and warming blanket. COVID-19 PCR is negative.    The following day patient had resolution of his symptoms.  Echo performed which showed EF 40 to 45% with grade 1 diastolic dysfunction.  He was discharged in stable condition with addition of Coreg and no anticoagulation due to low risk CHA2DS2-VASc 1 and advised to abstain from illicit drug use.  A & P  Principal Problem:   Atrial fibrillation with RVR (HCC) Active Problems:   Essential hypertension   Overdose opiate, accidental or unintentional, initial encounter ([place])  AKI (acute kidney injury) ([place])   Leukocytosis   1. New onset A. fib with RVR in setting of heroin overdose 1. Returned to sinus rhythm with IV fluids and two doses of lopressor 2. TSH and Mg normal 3. Echo: EF 40 to 45% with grade 1 diastolic  dysfunction 4. CHA2DS2-VASc: 1 5. Advised to follow-up with PCP and avoid illicit drug use 2. Heroin overdose status post Narcan x1 1. No agreed to abstain from any illicit drugs at this time 3. Compensated HFpEF 1. Echo as above 4. AKI likely from hemodynamic changes (from atrial fibrillation and heroin overdose) 1. Improved with IV fluids and holding Maxzide 2. Hold Maxzide one more day and get repeat BMP outpatient 5. Hypertension 1. Resume Maxide tomorrow to help renal function improve 6. Reactive leukocytosis, improving 1. Repeat CBC outpatient 7. Hyperglycemia, resolved  Patient's not had any labs since that visit. Another issue is that of insomnia as well.  He had acute kidney injury in the hospitalization he needs follow-up on the labs.  He is only taking Maxide that is the only medicine he is on now he is off his beta-blocker he ran out.  06/14/20  Essential hypertension Blood pressure elevated on arrival we will begin Coreg 12.5 mg twice daily and begin valsartan HCT 160/25 1 daily    Plantar warts We will give a trial of topical salicylic acid lotion and we will endeavor to get this patient the orange card so we can get into podiatry  Also recommended to get a good skin moisturizer for the feet  AKI (acute kidney injury) ([place]) Follow-up renal function  Leukocytosis Follow-up CBC   Diagnoses and all orders for this visit:  Essential hypertension -     Comprehensive metabolic panel -     CBC with Differential/Platelet; Future -     CBC with Differential/Platelet  AKI (acute kidney injury) ([place]) -     Comprehensive metabolic panel  Leukocytosis, unspecified type -     CBC with Differential/Platelet; Future -     CBC with Differential/Platelet  Plantar warts  Other orders -     carvedilol (COREG) 12.5 MG tablet; Take 1 tablet (12.5 mg total) by mouth 2 (two) times daily with a meal. -     Discontinue: valsartan-hydrochlorothiazide (DIOVAN-HCT) 160-25 MG  tablet; Take 1 tablet by mouth daily. -     Discontinue: melatonin 5 MG TABS; Take 2 tablets (10 mg total) by mouth at bedtime. -     Discontinue: Salicylic Acid 0.5 % LOTN; Apply to plantars warts twice a day -     melatonin 5 MG TABS; Take 2 tablets (10 mg total) by mouth at bedtime. -     Salicylic Acid 0.5 % LOTN; Apply to plantars warts twice a day -     valsartan-hydrochlorothiazide (DIOVAN-HCT) 160-25 MG tablet; Take 1 tablet by mouth daily.   We will endeavor to get this patient into the clinic for follow-up  For insomnia will prescribe melatonin 10 mg at bedtime   Past Medical History:  Diagnosis Date  . Deaf    left ear  . Essential hypertension 03/01/2018     Family History  Problem Relation Age of Onset  . Aneurysm Mother   . Hypertension Brother   . Hypertension Maternal Aunt      Social History   Socioeconomic History  . Marital status: Single    Spouse name: Not on file  . Number of children: Not on file  . Years  of education: Not on file  . Highest education level: Not on file  Occupational History  . Not on file  Tobacco Use  . Smoking status: Current Every Day Smoker    Packs/day: 0.25    Types: Cigarettes  . Smokeless tobacco: Never Used  Vaping Use  . Vaping Use: Never used  Substance and Sexual Activity  . Alcohol use: Yes    Comment: 3 times a week  . Drug use: Yes    Types: Marijuana, Heroin    Comment: heroin   . Sexual activity: Not Currently  Other Topics Concern  . Not on file  Social History Narrative  . Not on file   Social Determinants of Health   Financial Resource Strain: Not on file  Food Insecurity: Not on file  Transportation Needs: Not on file  Physical Activity: Not on file  Stress: Not on file  Social Connections: Not on file  Intimate Partner Violence: Not on file     No Known Allergies   Outpatient Medications Prior to Visit  Medication Sig Dispense Refill  . acetaminophen (TYLENOL) 500 MG tablet Take 500 mg  by mouth every 6 (six) hours as needed.    . carvedilol (COREG) 12.5 MG tablet Take 1 tablet (12.5 mg total) by mouth 2 (two) times daily with a meal. 60 tablet 1  . docusate sodium (COLACE) 100 MG capsule Take 100 mg by mouth daily as needed for mild constipation.    . melatonin 5 MG TABS Take 2 tablets (10 mg total) by mouth at bedtime. 60 tablet 1  . Salicylic Acid 0.5 % LOTN Apply to plantars warts twice a day 120 mL 0  . valsartan-hydrochlorothiazide (DIOVAN-HCT) 160-25 MG tablet Take 1 tablet by mouth daily. 90 tablet 3   No facility-administered medications prior to visit.     Review of Systems  HENT: Negative.   Respiratory: Negative.   Cardiovascular: Negative.   Gastrointestinal: Negative.   Musculoskeletal: Negative.   Neurological: Negative.   Hematological: Negative.   Psychiatric/Behavioral: Positive for sleep disturbance. The patient is nervous/anxious.        Objective:   Physical Exam  There were no vitals filed for this visit.  Gen: Pleasant, thin , in no distress,  normal affect  ENT: No lesions,  mouth clear,  oropharynx clear, no postnasal drip, very poor dentition with multiple carious teeth and severe periodontal disease  Neck: No JVD, no TMG, no carotid bruits  Lungs: No use of accessory muscles, no dullness to percussion, clear without rales or rhonchi  Cardiovascular: RRR, heart sounds normal, no murmur or gallops, no peripheral edema  Abdomen: soft and NT, no HSM,  BS normal  Musculoskeletal: No deformities, no cyanosis or clubbing  Neuro: alert, non focal  Skin: Warm, no lesions or rashes Foot exam was performed and shows plantars warts at the bases of both feet and also callus formation in the midfoot the patient's feet are extremely flat  There is also onychomycosis of the toenails  No results found.       Assessment & Plan:  I personally reviewed all images and lab data in the [facility] system as well as any outside material available  during this office visit and agree with the  radiology impressions.   No problem-specific Assessment & Plan notes found for this encounter.   There are no diagnoses linked to this encounter. We will endeavor to get this patient into the clinic for follow-up  For insomnia will prescribe  melatonin 10 mg at bedtime

## 2020-06-14 ENCOUNTER — Ambulatory Visit: Payer: Self-pay | Admitting: Critical Care Medicine

## 2020-06-23 ENCOUNTER — Ambulatory Visit: Payer: Self-pay | Admitting: Rheumatology

## 2020-06-23 ENCOUNTER — Encounter: Payer: Self-pay | Admitting: Rheumatology

## 2020-06-23 ENCOUNTER — Other Ambulatory Visit: Payer: Self-pay

## 2020-06-23 VITALS — BP 100/80 | HR 78 | Temp 97.2°F | Resp 16 | Ht 71.0 in | Wt 142.8 lb

## 2020-06-23 DIAGNOSIS — B070 Plantar wart: Secondary | ICD-10-CM

## 2020-06-23 DIAGNOSIS — I10 Essential (primary) hypertension: Secondary | ICD-10-CM

## 2020-06-23 MED ORDER — CARVEDILOL 12.5 MG PO TABS
12.5000 mg | ORAL_TABLET | Freq: Two times a day (BID) | ORAL | 1 refills | Status: DC
  Filled 2020-06-23 – 2020-07-02 (×2): qty 60, 30d supply, fill #0
  Filled 2020-08-10: qty 60, 30d supply, fill #1

## 2020-06-23 MED ORDER — LOSARTAN POTASSIUM 50 MG PO TABS
1.0000 | ORAL_TABLET | Freq: Every day | ORAL | 1 refills | Status: DC
  Filled 2020-06-23 – 2020-07-02 (×2): qty 30, 30d supply, fill #0

## 2020-06-23 MED ORDER — HYDROCHLOROTHIAZIDE 12.5 MG PO CAPS
12.5000 mg | ORAL_CAPSULE | Freq: Every day | ORAL | 1 refills | Status: DC
  Filled 2020-06-23 – 2020-07-02 (×2): qty 30, 30d supply, fill #0

## 2020-06-23 NOTE — Progress Notes (Signed)
[facility]  PROGRESS NOTE  Patient:Bryan Herring Male   DOB:February 26, 1960     60 y.o.  NTZ:[id number]  Visit Date: 06/23/2020  HPI:  60 year old African-American male.  Currently in residential treatment center.  Was in [place] in [place] History of hypertension.  Decreased ejection fraction.  Has been on Coreg and valsartan.  No recent chest pain or shortness of breath.  Needs meds refilled.  Recent creatinine and CBC in April were unremarkable Cocaine marijuana heroin use.  Previous detox with improvement. Major complaint is dropped arches.  Has large calluses.  Has been on salicylic acid rubs.  Has not had any special shoes or surgery.  Prior podiatry evaluation  Review of systems: Left ear deafness since childhood   Past Medical History:  Diagnosis Date   Deaf    left ear   Essential hypertension 03/01/2018   Substance abuse ([place])     Past Surgical History:  Procedure Laterality Date   ACHILLES TENDON SURGERY      Social History   Tobacco Use   Smoking status: Former    Packs/day: 0.25    Pack years: 0.00    Types: Cigarettes    Quit date: 04/2020    Years since quitting: 0.2   Smokeless tobacco: Never   Tobacco comments:    Patient wearing nicotine patch  Substance Use Topics   Alcohol use: Not Currently    Comment: last use April 2022 (resident at [facility])     MEDICATIONS: Current Outpatient Medications  Medication Sig Dispense Refill   carvedilol (COREG) 12.5 MG tablet Take 1 tablet (12.5 mg total) by mouth 2 (two) times daily with a meal. 60 tablet 1   nicotine (NICODERM CQ - DOSED IN MG/24 HOURS) 14 mg/24hr patch Place 14 mg onto the skin daily.     Salicylic Acid 0.5 % LOTN Apply to plantars warts twice a day 120 mL 0   valsartan-hydrochlorothiazide (DIOVAN-HCT) 160-25 MG tablet Take 1 tablet by mouth daily. 90 tablet 3   docusate sodium (COLACE) 100 MG capsule Take 100 mg by mouth daily as needed for mild constipation.      melatonin 5 MG TABS Take 2 tablets (10 mg total) by mouth at bedtime. (Patient not taking: Reported on 06/23/2020) 60 tablet 1   No current facility-administered medications for this visit.     ALLERGIES No Known Allergies   PHYSICAL EXAM: Blood pressure 100/80, pulse 78, temperature (!) 97.2 F (36.2 C), resp. rate 16, height 5\' 11"  (1.803 m), weight 142 lb 12.8 oz (64.8 kg), SpO2 99 %. Thin African-American male.  No acute distress.  Sclera clear.  Clear chest.  Regular rhythm.  No visceromegaly.  No significant edema Musculoskeletal: Dropped arches.  Prominent callus in the midfoot bilaterally.  Reasonable range of motion of the ankle.  Symmetric knee and ankle jerks   ASSESSMENT: Hypertension, appears controlled Substance abuse, and treatment Dropped arches with calluses, chronic Mild cardiomyopathy with decreased ejection fraction, compensated   PLAN: Refilled Coreg and valsartan Same salicylic acid Recheck in July with blood pressure check.  Consider podiatry referral   G. Fayrene Fearing. MD           06/23/2020,  10:30 AM

## 2020-06-23 NOTE — Patient Instructions (Signed)
Followup 7/27. Meds sent to med management

## 2020-06-24 ENCOUNTER — Other Ambulatory Visit: Payer: Self-pay

## 2020-06-25 ENCOUNTER — Other Ambulatory Visit: Payer: Self-pay

## 2020-07-02 ENCOUNTER — Other Ambulatory Visit: Payer: Self-pay

## 2020-07-07 ENCOUNTER — Other Ambulatory Visit: Payer: Self-pay

## 2020-07-07 ENCOUNTER — Ambulatory Visit: Payer: Self-pay | Admitting: Pharmacy Technician

## 2020-07-07 DIAGNOSIS — Z79899 Other long term (current) drug therapy: Secondary | ICD-10-CM

## 2020-07-07 NOTE — Progress Notes (Signed)
Completed [facility] application and contract.  Patient agreed to all terms of the [facility] contract.    Patient approved to receive medication assistance at [facility] until time for re-certification in 2023, and as long as eligibility criteria continues to be met.    Provided patient with community resource material based on his particular needs.    Bryan Herring Bryan Herring Care Manager [facility]  

## 2020-07-15 ENCOUNTER — Other Ambulatory Visit: Payer: Self-pay

## 2020-07-21 ENCOUNTER — Other Ambulatory Visit: Payer: Self-pay

## 2020-07-21 ENCOUNTER — Emergency Department
Admission: EM | Admit: 2020-07-21 | Discharge: 2020-07-21 | Disposition: A | Discharge status: Home / Self Care | Payer: Self-pay | Attending: Emergency Medicine | Admitting: Emergency Medicine

## 2020-07-21 DIAGNOSIS — R42 Dizziness and giddiness: Secondary | ICD-10-CM | POA: Insufficient documentation

## 2020-07-21 DIAGNOSIS — R531 Weakness: Secondary | ICD-10-CM | POA: Insufficient documentation

## 2020-07-21 DIAGNOSIS — Z79899 Other long term (current) drug therapy: Secondary | ICD-10-CM | POA: Insufficient documentation

## 2020-07-21 DIAGNOSIS — Z87891 Personal history of nicotine dependence: Secondary | ICD-10-CM | POA: Insufficient documentation

## 2020-07-21 DIAGNOSIS — R000 Tachycardia, unspecified: Secondary | ICD-10-CM | POA: Insufficient documentation

## 2020-07-21 DIAGNOSIS — E860 Dehydration: Secondary | ICD-10-CM

## 2020-07-21 DIAGNOSIS — I10 Essential (primary) hypertension: Secondary | ICD-10-CM | POA: Insufficient documentation

## 2020-07-21 LAB — URINALYSIS, COMPLETE (UACMP) WITH MICROSCOPIC
Bacteria, UA: NONE SEEN
Bilirubin Urine: NEGATIVE
Glucose, UA: NEGATIVE mg/dL
Hgb urine dipstick: NEGATIVE
Ketones, ur: NEGATIVE mg/dL
Leukocytes,Ua: NEGATIVE
Nitrite: NEGATIVE
Protein, ur: NEGATIVE mg/dL
Specific Gravity, Urine: 1.018 (ref 1.005–1.030)
pH: 5 (ref 5.0–8.0)

## 2020-07-21 LAB — CBC
HCT: 33.3 % — ABNORMAL LOW (ref 39.0–52.0)
Hemoglobin: 11.3 g/dL — ABNORMAL LOW (ref 13.0–17.0)
MCH: 31 pg (ref 26.0–34.0)
MCHC: 33.9 g/dL (ref 30.0–36.0)
MCV: 91.5 fL (ref 80.0–100.0)
Platelets: 207 10*3/uL (ref 150–400)
RBC: 3.64 MIL/uL — ABNORMAL LOW (ref 4.22–5.81)
RDW: 12.2 % (ref 11.5–15.5)
WBC: 7.9 10*3/uL (ref 4.0–10.5)
nRBC: 0 % (ref 0.0–0.2)

## 2020-07-21 LAB — BASIC METABOLIC PANEL WITH GFR
Anion gap: 10 (ref 5–15)
BUN: 33 mg/dL — ABNORMAL HIGH (ref 6–20)
CO2: 29 mmol/L (ref 22–32)
Calcium: 10 mg/dL (ref 8.9–10.3)
Chloride: 101 mmol/L (ref 98–111)
Creatinine, Ser: 0.89 mg/dL (ref 0.61–1.24)
GFR, Estimated: >60 mL/min
Glucose, Bld: 115 mg/dL — ABNORMAL HIGH (ref 70–99)
Potassium: 3.6 mmol/L (ref 3.5–5.1)
Sodium: 140 mmol/L (ref 135–145)

## 2020-07-21 MED ORDER — SODIUM CHLORIDE 0.9 % IV SOLN
1000.0000 mL | Freq: Once | INTRAVENOUS | Status: AC
  Administered 2020-07-21: 1000 mL via INTRAVENOUS

## 2020-07-21 NOTE — ED Triage Notes (Signed)
Pt comes with c/o weakness and dizziness that has been going on for week. Pt states diarrhea. Pt states he just feels real dizzy.  Pt states his BP was low this am.

## 2020-07-21 NOTE — ED Notes (Signed)
RN [place] called and informed that pt is on his way and will need IV and labs.

## 2020-07-21 NOTE — ED Triage Notes (Signed)
First Nurse Note:  Presents from [facility] with c/o weakness.  Per nurse report, patient BP low this morning.  AAOx3.  Skin warm and dry. NAD

## 2020-07-21 NOTE — ED Provider Notes (Addendum)
[facility] Emergency Department Provider Note   ____________________________________________    I have reviewed the triage vital signs and the nursing notes.   HISTORY  Chief Complaint Weakness     HPI Bryan Herring is a 60 y.o. male with a history as noted below who presents with complaints of weakness and dizziness.  Patient reports over the last several days he has felt dizzy when bending over, he reports his energy has been low as well.  Overall however when he is lying down he feels quite well.  He denies fevers or chills.  No body aches.  No cough or shortness of breath.  No new medications.  Normal stools  Past Medical History:  Diagnosis Date   Deaf    left ear   Essential hypertension 03/01/2018   Substance abuse [facility])     Patient Active Problem List   Diagnosis Date Noted   Plantar warts 04/28/2020   AKI (acute kidney injury) ([place]) 02/04/2019   Leukocytosis 02/04/2019   LVH (left ventricular hypertrophy) 03/01/2018   Essential hypertension 03/01/2018    Past Surgical History:  Procedure Laterality Date   ACHILLES TENDON SURGERY      Prior to Admission medications   Medication Sig Start Date End Date Taking? Authorizing Provider  carvedilol (COREG) 12.5 MG tablet Take 1 tablet (12.5 mg total) by mouth 2 (two) times daily with a meal. 04/28/20 04/28/21  Elsie Stain, MD  carvedilol (COREG) 12.5 MG tablet Take 1 tablet (12.5 mg total) by mouth 2 (two) times daily with a meal. 06/23/20   Emmaline Kluver., MD  docusate sodium (COLACE) 100 MG capsule Take 100 mg by mouth daily as needed for mild constipation.    [provider]  hydrochlorothiazide (MICROZIDE) 12.5 MG capsule Take 1 capsule (12.5 mg total) by mouth once daily. (Take with Losartan 50mg ). 06/23/20     losartan (COZAAR) 50 MG tablet Take 1 tablet (50 mg total) by mouth once daily. (Take with hydrochlorothiazide 12.5mg ). 06/23/20   Emmaline Kluver., MD  melatonin 5 MG TABS Take 2 tablets (10 mg total) by mouth at bedtime. Patient not taking: Reported on 06/23/2020 04/28/20   Elsie Stain, MD  nicotine (NICODERM CQ - DOSED IN MG/24 HOURS) 14 mg/24hr patch Place 14 mg onto the skin daily.    [provider]  Salicylic Acid 0.5 % LOTN Apply to plantars warts twice a day 04/28/20   Elsie Stain, MD  valsartan-hydrochlorothiazide (DIOVAN-HCT) 160-25 MG tablet Take 1 tablet by mouth daily. 04/28/20   Elsie Stain, MD     Allergies Patient has no known allergies.  Family History  Problem Relation Age of Onset   Aneurysm Mother    Other Father        unknown medical history   Hypertension Brother    Hypertension Maternal Aunt    Other Maternal Grandmother        unknown medical history   Other Maternal Grandfather        unknown medical history   Other Paternal Grandmother        unknown medical history   Other Paternal Grandfather        unknown medical history    Social History Social History   Tobacco Use   Smoking status: Former    Packs/day: 0.25    Pack years: 0.00    Types: Cigarettes    Quit date: 04/2020    Years  since quitting: 0.2   Smokeless tobacco: Never   Tobacco comments:    Patient wearing nicotine patch  Vaping Use   Vaping Use: Never used  Substance Use Topics   Alcohol use: Not Currently    Comment: last use April 2022 (resident at [facility])   Drug use: Not Currently    Types: Marijuana, Heroin    Comment: last use April 2022 (resident at [facility])    Review of Systems  Constitutional: No fever/chills Eyes: No visual changes.  ENT: No sore throat. Cardiovascular: Denies chest pain. Respiratory: Denies shortness of breath. Gastrointestinal: No abdominal pain.  No nausea, no vomiting.   Genitourinary: Negative for dysuria. Musculoskeletal: Negative for back pain. Skin: Negative for rash. Neurological: Negative for headaches or weakness, dizziness as above, no neurodeficits    ____________________________________________   PHYSICAL EXAM:  VITAL SIGNS: ED Triage Vitals  Enc Vitals Group     BP 07/21/20 1039 (!) 75/60     Pulse Rate 07/21/20 1039 (!) 115     Resp 07/21/20 1039 19     Temp 07/21/20 1039 98 F (36.7 C)     Temp src --      SpO2 07/21/20 1039 100 %     Weight --      Height --      Head Circumference --      Peak Flow --      Pain Score 07/21/20 1036 8     Pain Loc --      Pain Edu? --      Excl. in [place]? --     Constitutional: Alert and oriented. No acute distress. Pleasant and interactive Eyes: Conjunctivae are normal.   Mouth/Throat: Mucous membranes are dry  Cardiovascular: Normal rate, regular rhythm. Grossly normal heart sounds.  Good peripheral circulation. Respiratory: Normal respiratory effort.  No retractions. Lungs CTAB. Gastrointestinal: Soft and nontender. No distention.  No CVA tenderness.  Musculoskeletal: No lower extremity tenderness nor edema.  Warm and well perfused Neurologic:  Normal speech and language. No gross focal neurologic deficits are appreciated.  Skin:  Skin is warm, dry and intact. No rash noted. Psychiatric: Mood and affect are normal. Speech and behavior are normal.  ____________________________________________   LABS (all labs ordered are listed, but only abnormal results are displayed)  Labs Reviewed  BASIC METABOLIC PANEL - Abnormal; Notable for the following components:      Result Value   Glucose, Bld 115 (*)    BUN 33 (*)    All other components within normal limits  CBC - Abnormal; Notable for the following components:   RBC 3.64 (*)    Hemoglobin 11.3 (*)    HCT 33.3 (*)    All other components within normal limits  URINALYSIS, COMPLETE (UACMP) WITH MICROSCOPIC - Abnormal; Notable for the following components:   Color, Urine YELLOW (*)    APPearance HAZY (*)    All other components within normal limits  CBG MONITORING, ED    ____________________________________________  EKG ED ECG REPORT I, Lavonia Drafts, the attending physician, personally viewed and interpreted this ECG.  Date: 07/21/2020  Rhythm: Sinus tachycardia QRS Axis: normal Intervals: normal ST/T Wave abnormalities: normal Narrative Interpretation: no evidence of acute ischemia  ____________________________________________  RADIOLOGY  None ____________________________________________   PROCEDURES  Procedure(s) performed: No  Procedures   Critical Care performed: No ____________________________________________   INITIAL IMPRESSION / ASSESSMENT AND PLAN / ED COURSE  Pertinent labs & imaging results that were available during my care of  the patient were reviewed by me and considered in my medical decision making (see chart for details).   Patient presents with mild dizziness with bending over, feels well when lying down.  No chest pain or palpitations.  Normal stools.  Did have an episode of diarrhea yesterday however today unremarkable.  Not on blood thinners.  No history of anemia reported.  Patient mildly tachycardic with low blood pressure as well, question dehydration versus AKI, will give IV fluids, check labs.  Not consistent with ACS, EKG demonstrates sinus tachycardia, otherwise unremarkable.  ----------------------------------------- 2:46 PM on 07/21/2020 ----------------------------------------- Patient feeling much better after fluids and lunch, he is asking to leave and states that he is "good ".  Blood pressure has improved significantly, orthostatics reassuring, appropriate for discharge at this time with strict return precautions, counseled increased p.o. fluid intake, outpatient follow-up    ____________________________________________   FINAL CLINICAL IMPRESSION(S) / ED DIAGNOSES  Final diagnoses:  Dehydration        Note:  This document was prepared using Dragon voice recognition software and may  include unintentional dictation errors.    Lavonia Drafts, MD 07/21/20 1326    Lavonia Drafts, MD 07/21/20 1446

## 2020-07-21 NOTE — ED Notes (Signed)
   07/21/20 1355 07/21/20 1356 07/21/20 1358  Vitals  BP 100/66 100/65 110/86  MAP (mmHg) 77 76 92  Patient Position (if appropriate) Lying Sitting Standing  ECG Heart Rate 97 94 (!) 113  Resp 20 14 (!) 24   Orthostatic vitals. Patient states he feels dizzy when moving around all day and bending over

## 2020-07-22 ENCOUNTER — Other Ambulatory Visit: Payer: Self-pay

## 2020-07-22 ENCOUNTER — Encounter: Payer: Self-pay | Admitting: Gerontology

## 2020-07-22 ENCOUNTER — Ambulatory Visit: Payer: Self-pay | Admitting: Gerontology

## 2020-07-22 VITALS — BP 93/66 | HR 99 | Temp 98.1°F | Resp 16 | Ht 71.0 in | Wt 133.2 lb

## 2020-07-22 DIAGNOSIS — I959 Hypotension, unspecified: Secondary | ICD-10-CM

## 2020-07-22 DIAGNOSIS — M545 Low back pain, unspecified: Secondary | ICD-10-CM | POA: Insufficient documentation

## 2020-07-22 DIAGNOSIS — L84 Corns and callosities: Secondary | ICD-10-CM | POA: Insufficient documentation

## 2020-07-22 DIAGNOSIS — I517 Cardiomegaly: Secondary | ICD-10-CM

## 2020-07-22 MED ORDER — IBUPROFEN 800 MG PO TABS
800.0000 mg | ORAL_TABLET | Freq: Two times a day (BID) | ORAL | 0 refills | Status: DC
  Filled 2020-07-22: qty 30, 15d supply, fill #0

## 2020-07-22 NOTE — Progress Notes (Signed)
Established Patient Office Visit  Subjective:  Patient ID: Bryan Herring, male    DOB: [birth year]  Age: 60 y.o. MRN: [id number]  CC:  Chief Complaint  Patient presents with   Follow-up    dehydration   Hypertension    Has OV scheduled 08/04/20 for HTN follow up   Hip Pain    Patient in today c/o left hip/back pain x today. Patient states he was moving furniture this morning and then started having pain. Patient has not taken any OTC medications.    HPI Bryan Herring is a 60 year old male who has history of hypertension, substance abuse, deaf to left ear presents for complaint of left lower back pain that started this morning while he was moving furniture in a store. He states that he heard a popping sound to his hip and that's when his pain started. He states that his pain is sharp 8/10, and nonradiating.  He states that pain is aggravated when bending down and moving his left leg. He has not taking any medication. He was seen at the ED on 07/21/20 for weakness and dizziness. His blood pressure during visit was low at 88/59. He denies light headedness, vertigo, shortness of breath and vision changes. He also c/o the painful chronic callus to the plantar section of his dropped arch of his right foot. He states that it affects his walking and he's unable to wear shoes. Overall, he states that  he's concerned about his left lower back pain and offers no further complaint.  Past Medical History:  Diagnosis Date   Deaf    left ear   Essential hypertension 03/01/2018   Substance abuse ([place])     Past Surgical History:  Procedure Laterality Date   ACHILLES TENDON SURGERY      Family History  Problem Relation Age of Onset   Aneurysm Mother    Other Father        unknown medical history   Hypertension Brother    Hypertension Maternal Aunt    Other Maternal Grandmother        unknown medical history   Other Maternal Grandfather        unknown medical history    Other Paternal Grandmother        unknown medical history   Other Paternal Grandfather        unknown medical history    Social History   Socioeconomic History   Marital status: Single    Spouse name: Not on file   Number of children: Not on file   Years of education: Not on file   Highest education level: Not on file  Occupational History   Not on file  Tobacco Use   Smoking status: Former    Packs/day: 0.25    Types: Cigarettes    Quit date: 04/2020    Years since quitting: 0.2   Smokeless tobacco: Never   Tobacco comments:    Patient wearing nicotine patch  Vaping Use   Vaping Use: Never used  Substance and Sexual Activity   Alcohol use: Not Currently    Comment: last use April 14, 2020 (resident at [facility])   Drug use: Not Currently    Types: Marijuana, Heroin    Comment: last use April 14, 2020 (resident at [facility])   Sexual activity: Not Currently  Other Topics Concern   Not on file  Social History Narrative   Not on file   Social Determinants of Health   Financial Resource Strain:  Not on file  Food Insecurity: Food Insecurity Present   Worried About Charity fundraiser in the Last Year: Sometimes true   Ran Out of Food in the Last Year: Sometimes true  Transportation Needs: Unmet Transportation Needs   Lack of Transportation (Medical): Yes   Lack of Transportation (Non-Medical): Yes  Physical Activity: Not on file  Stress: Not on file  Social Connections: Not on file  Intimate Partner Violence: Not on file    Outpatient Medications Prior to Visit  Medication Sig Dispense Refill   carvedilol (COREG) 12.5 MG tablet Take 1 tablet (12.5 mg total) by mouth 2 (two) times daily with a meal. 60 tablet 1   docusate sodium (COLACE) 100 MG capsule Take 100 mg by mouth daily as needed for mild constipation.     melatonin 5 MG TABS Take 2 tablets (10 mg total) by mouth at bedtime. 60 tablet 1   nicotine (NICODERM CQ - DOSED IN MG/24 HOURS) 14 mg/24hr patch Place 14 mg onto  the skin daily.     Salicylic Acid 0.5 % LOTN Apply to plantars warts twice a day 120 mL 0   hydrochlorothiazide (MICROZIDE) 12.5 MG capsule Take 1 capsule (12.5 mg total) by mouth once daily. (Take with Losartan 43m). 30 capsule 1   carvedilol (COREG) 12.5 MG tablet Take 1 tablet (12.5 mg total) by mouth 2 (two) times daily with a meal. 60 tablet 1   losartan (COZAAR) 50 MG tablet Take 1 tablet (50 mg total) by mouth once daily. (Take with hydrochlorothiazide 12.520m. 30 tablet 1   valsartan-hydrochlorothiazide (DIOVAN-HCT) 160-25 MG tablet Take 1 tablet by mouth daily. 90 tablet 3   No facility-administered medications prior to visit.    No Known Allergies  ROS Review of Systems  Constitutional: Negative.   Respiratory: Negative.    Cardiovascular: Negative.   Musculoskeletal:  Positive for arthralgias (left lower back pain).  Skin:        Painful callous to right foot  Neurological: Negative.      Objective:    Physical Exam HENT:     Head: Normocephalic and atraumatic.  Cardiovascular:     Rate and Rhythm: Normal rate and regular rhythm.     Pulses: Normal pulses.     Heart sounds: Normal heart sounds.  Pulmonary:     Effort: Pulmonary effort is normal.     Breath sounds: Normal breath sounds.  Musculoskeletal:       Back:  Feet:     Right foot:     Skin integrity: Callus (right foot) present.  Neurological:     Mental Status: He is alert.    BP 93/66 (BP Location: Left Arm, Patient Position: Sitting, Cuff Size: Normal)   Pulse 99   Temp 98.1 F (36.7 C)   Resp 16   Ht _0  (1.803 m)   Wt 133 lb 3.2 oz (60.4 kg)   SpO2 96%   BMI 18.58 kg/m  Wt Readings from Last 3 Encounters:  07/22/20 133 lb 3.2 oz (60.4 kg)  06/23/20 142 lb 12.8 oz (64.8 kg)  04/28/20 130 lb (59 kg)     Health Maintenance Due  Topic Date Due   COVID-19 Vaccine (1) Never done   Pneumococcal Vaccine 0-1464ears old (1 - PCV) Never done   Hepatitis C Screening  Never done    TETANUS/TDAP  Never done   Zoster Vaccines- Shingrix (1 of 2) Never done   COLONOSCOPY (Pts 45-498yrnsurance coverage will  need to be confirmed)  Never done    There are no preventive care reminders to display for this patient.  Lab Results  Component Value Date   TSH 1.589 02/03/2019   Lab Results  Component Value Date   WBC 7.9 07/21/2020   HGB 11.3 (L) 07/21/2020   HCT 33.3 (L) 07/21/2020   MCV 91.5 07/21/2020   PLT 207 07/21/2020   Lab Results  Component Value Date   NA 140 07/21/2020   K 3.6 07/21/2020   CO2 29 07/21/2020   GLUCOSE 115 (H) 07/21/2020   BUN 33 (H) 07/21/2020   CREATININE 0.89 07/21/2020   BILITOT 0.3 04/28/2020   ALKPHOS 73 04/28/2020   AST 15 04/28/2020   ALT 30 04/28/2020   PROT 7.4 04/28/2020   ALBUMIN 4.3 04/28/2020   CALCIUM 10.0 07/21/2020   ANIONGAP 10 07/21/2020   EGFR 95 04/28/2020   No results found for: CHOL No results found for: HDL No results found for: LDLCALC No results found for: TRIG No results found for: CHOLHDL Lab Results  Component Value Date   HGBA1C 5.7 (H) 02/04/2019      Assessment & Plan:    1. Hypotension, unspecified hypotension type -His hydrochlorothiazide  and losartan was discontinued during visit.  He was advised to check his blood pressure daily, record and bring log to follow-up visit.  He was advised to increase water intake.  2. LVH (left ventricular hypertrophy) -He has a history of left ventricular hypertrophy and has not seen a cardiologist.  He was advised to notify the clinic of any chest pain and go to the emergency room.  He was encouraged to complete [employer] application for - Ambulatory referral to Cardiology.  3. Acute left-sided low back pain, unspecified whether sciatica present -He will continue on ibuprofen 800 mg 2 times a day.  He was educated on medication side effects and advised to notify the clinic. - ibuprofen (ADVIL) 800 MG tablet; Take 1 tablet (800 mg total) by mouth 2  (two) times daily.  Dispense: 30 tablet; Refill: 0  4. Callus of foot -He was advised to complete [employer] application for- Ambulatory referral to Podiatry   Follow-up: Return in about 3 weeks (around 08/12/2020), or if symptoms worsen or fail to improve.    Emmersyn Kratzke Jerold Coombe, NP

## 2020-08-03 ENCOUNTER — Ambulatory Visit: Payer: Self-pay | Admitting: Pharmacist

## 2020-08-03 ENCOUNTER — Encounter: Payer: Self-pay | Admitting: Pharmacist

## 2020-08-03 ENCOUNTER — Encounter (INDEPENDENT_AMBULATORY_CARE_PROVIDER_SITE_OTHER): Payer: Self-pay

## 2020-08-03 ENCOUNTER — Other Ambulatory Visit: Payer: Self-pay

## 2020-08-03 DIAGNOSIS — Z79899 Other long term (current) drug therapy: Secondary | ICD-10-CM

## 2020-08-03 NOTE — Progress Notes (Signed)
[facility] Visit Note  Patient: Bryan Herring MRN: [id number] Date of Birth: 1960/12/02 PCP: Langston Reusing, NP   [name] 60 y.o. male presents in good spirits for a medication therapy management visit today via telephone. [facility] he was allowed to utilize their telephone. Pt was identified via name and DOB.   There were no vitals taken for this visit.  Patient Information   Past Medical History:  Diagnosis Date   Deaf    left ear   Essential hypertension 03/01/2018   Substance abuse ([place])       Past Surgical History:  Procedure Laterality Date   ACHILLES TENDON SURGERY       Family History  Problem Relation Age of Onset   Aneurysm Mother    Other Father        unknown medical history   Hypertension Brother    Other Maternal Grandmother        unknown medical history   Other Maternal Grandfather        unknown medical history   Other Paternal Grandmother        unknown medical history   Other Paternal Grandfather        unknown medical history   Hypertension Maternal Aunt     Lifestyle  Needs are being taken care of via [facility].  Diet: pt endorses he drinks coffee (multiple cups per day) and water. Pt states [facility] dietary plan consists of all baked foods, vegetables and fruits.  Exercise: Pt walks around center often and states he has to walk up and down stairs to get to his room and dining hall.   Social History   Substance and Sexual Activity  Alcohol Use Not Currently   Comment: last use April 14, 2020 (resident at [facility])      Social History   Tobacco Use  Smoking Status Former   Packs/day: 0.25   Types: Cigarettes   Quit date: 04/2020   Years since quitting: 0.3  Smokeless Tobacco Never  Tobacco Comments   Patient wearing nicotine patch      Health Maintenance  Topic Date Due   COVID-19 Vaccine (1) Never done   Pneumococcal Vaccine 37-64  Years old (1 - PCV) Never done   Hepatitis C Screening  Never done   TETANUS/TDAP  Never done   Zoster Vaccines- Shingrix (1 of 2) Never done   COLONOSCOPY (Pts 45-2yr Insurance coverage will need to be confirmed)  Never done   INFLUENZA VACCINE  08/09/2020   HIV Screening  Completed   HPV VACCINES  Aged Out   Health Maintenance/Date Completed  Last ED visit: 07/21/2020 Last Visit to PCP: 07/22/2020 Next Visit to PCP: 08/04/2020 Specialist Visit: - referrals have been placed to Podiatry and Cardiology  Dental Exam: not recently  Eye Exam: not recently but states he needs one due to "squinting a lot." Prostate Exam: never  Colonoscopy: never  Flu Vaccine: unsure  Pneumonia Vaccine: never  COVID-19 Vaccine: never  Shingrix Vaccine: never   Outpatient Encounter Medications as of 08/03/2020  Medication Sig   carvedilol (COREG) 12.5 MG tablet Take 1 tablet (12.5 mg total) by mouth 2 (two) times daily with a meal.   ibuprofen (ADVIL) 800 MG tablet Take 1 tablet (800 mg total) by mouth 2 (two) times daily.   melatonin 5 MG TABS Take 2 tablets (10 mg total) by mouth at bedtime.   nicotine (NICODERM CQ -  DOSED IN MG/24 HOURS) 14 mg/24hr patch Place 14 mg onto the skin daily.   Salicylic Acid 0.5 % LOTN Apply to plantars warts twice a day   docusate sodium (COLACE) 100 MG capsule Take 100 mg by mouth daily as needed for mild constipation. (Patient not taking: Reported on 08/03/2020)   [DISCONTINUED] carvedilol (COREG) 12.5 MG tablet Take 1 tablet (12.5 mg total) by mouth 2 (two) times daily with a meal.   [DISCONTINUED] hydrochlorothiazide (MICROZIDE) 12.5 MG capsule Take 1 capsule (12.5 mg total) by mouth once daily. (Take with Losartan '50mg'$ ).   [DISCONTINUED] losartan (COZAAR) 50 MG tablet Take 1 tablet (50 mg total) by mouth once daily. (Take with hydrochlorothiazide 12.'5mg'$ ).   [DISCONTINUED] valsartan-hydrochlorothiazide (DIOVAN-HCT) 160-25 MG tablet Take 1 tablet by mouth daily.    Facility-Administered Encounter Medications as of 08/03/2020  Medication   [COMPLETED] 0.9 %  sodium chloride infusion     Assessment and Plan:  Medication Management/Adherence:  Pt reports adherence to all medications. Medications are being take care of by [facility] at this time. Only concern today is that pt would like refill on Salicylic Acid [id number] Lotion. Pt has an appt with Dr. Percell Boston, MD tomorrow and will ask for a refill.   Atrial Fibrillation/Blood Pressure:  Current regimen consists of carvedilol for rate control. On 02/04/19, ED visit was indicative of new onset Afib, and Echo showed EF 40-45% with grade 1 diastolic dysfunction. Today, pt endorses racing heart beat when doing physical work and sometimes when resting/laying down. Denies dizziness, chest pain and SOB. Hydrochlorothiazide and losartan discontinued at last [facility] visit due to hypotension (07/22/20). Pt reports some visual changes(blurs, excessive squinting) partly due to needing an eye exam because he lost his glasses. Also reports headaches sometimes. Referral to cardiology placed by PCP. Patient needs to complete the [facility] financial application. Followed by: Chioma Iloabachie,NP.   Smoking Cessation:  Currently on nicotine patch. Pt was commended on adherence and his progress through his program. Pt did not report any skin irritations. Followed by: Caryl Asp, NP.   Podiatric Issues:  Currently out of Salicylic Acid [id number] Lotion. Pt has complaints of extreme pain in his feet and reports edema in his ankles. Referral to podiatry has been placed. Recommended pt follow up tomorrow at [facility] visit.    FU with clinic in 1 year.   M. [place] Angelmarie Ponzo, BS  PharmD Candidate [phone]  [place] [facility]    Cosigned: Cleopatra Cedar. Dicky Doe, PharmD [facility] [place] Operations Coordinator [phone]

## 2020-08-04 ENCOUNTER — Other Ambulatory Visit: Payer: Self-pay

## 2020-08-04 ENCOUNTER — Ambulatory Visit: Payer: Self-pay | Admitting: Rheumatology

## 2020-08-04 ENCOUNTER — Encounter: Payer: Self-pay | Admitting: Rheumatology

## 2020-08-04 VITALS — BP 112/76 | HR 76 | Temp 97.3°F | Ht 71.0 in | Wt 140.5 lb

## 2020-08-04 DIAGNOSIS — M545 Low back pain, unspecified: Secondary | ICD-10-CM

## 2020-08-04 MED ORDER — SALICYLIC ACID 0.5 % EX LOTN
TOPICAL_LOTION | CUTANEOUS | 0 refills | Status: DC
  Filled 2020-08-04: qty 120, fill #0

## 2020-08-04 NOTE — Progress Notes (Signed)
[facility]  PROGRESS NOTE  Patient:Bryan Herring Male   DOB:[birth year]     59 y.o.  [id number]  Visit Date: 08/04/2020  HPI:  Follow-up back pain.  Had ibuprofen.  Bothered him after moving some furniture.  No pain into the legs.  Improved. Wanting to see podiatry.  Referral has been sent.  Has callus on left foot.  Out of salicylic acid Blood pressures improved after ER visit.  No recent shortness of breath.      Past Medical History:  Diagnosis Date   Deaf    left ear   Essential hypertension 03/01/2018   Substance abuse ([place])     Past Surgical History:  Procedure Laterality Date   ACHILLES TENDON SURGERY      Social History   Tobacco Use   Smoking status: Former    Packs/day: 0.25    Types: Cigarettes    Quit date: 04/2020    Years since quitting: 0.3   Smokeless tobacco: Never   Tobacco comments:    Patient wearing nicotine patch  Substance Use Topics   Alcohol use: Not Currently    Comment: last use April 14, 2020 (resident at [facility])     MEDICATIONS: Current Outpatient Medications  Medication Sig Dispense Refill   carvedilol (COREG) 12.5 MG tablet Take 1 tablet (12.5 mg total) by mouth 2 (two) times daily with a meal. 60 tablet 1   docusate sodium (COLACE) 100 MG capsule Take 100 mg by mouth daily as needed for mild constipation. (Patient not taking: Reported on 08/03/2020)     ibuprofen (ADVIL) 800 MG tablet Take 1 tablet (800 mg total) by mouth 2 (two) times daily. 30 tablet 0   melatonin 5 MG TABS Take 2 tablets (10 mg total) by mouth at bedtime. 60 tablet 1   nicotine (NICODERM CQ - DOSED IN MG/24 HOURS) 14 mg/24hr patch Place 14 mg onto the skin daily.     Salicylic Acid 0.5 % LOTN Apply to plantars warts twice a day 120 mL 0   No current facility-administered medications for this visit.     ALLERGIES No Known Allergies   PHYSICAL EXAM: There were no vitals taken for this visit. Pleasant male.  Clear chest.  No  significant edema Musculoskeletal: Good flexion-extension lumbar spine.  Negative straight leg raising. Well knees move well.  Symmetric knee and ankle jerks   ASSESSMENT: Recent low back strain.  No evidence of radiculopathy Hypertension, recently hypotensive but now back off meds.  Improved  Dropped arch with chronic callus    PLAN: Has follow-up for blood pressure checks Refill salicylic acid Podiatry referral upcoming    G. Fayrene Fearing. MD           08/04/2020,  9:09 AM

## 2020-08-05 ENCOUNTER — Other Ambulatory Visit: Payer: Self-pay

## 2020-08-10 ENCOUNTER — Other Ambulatory Visit: Payer: Self-pay | Admitting: Gerontology

## 2020-08-10 ENCOUNTER — Other Ambulatory Visit: Payer: Self-pay

## 2020-08-10 DIAGNOSIS — M545 Low back pain, unspecified: Secondary | ICD-10-CM

## 2020-08-10 MED ORDER — IBUPROFEN 800 MG PO TABS
800.0000 mg | ORAL_TABLET | Freq: Two times a day (BID) | ORAL | 0 refills | Status: DC
  Filled 2020-08-10: qty 30, 15d supply, fill #0

## 2020-08-12 ENCOUNTER — Other Ambulatory Visit: Payer: Self-pay

## 2020-08-12 ENCOUNTER — Encounter: Payer: Self-pay | Admitting: Gerontology

## 2020-08-12 ENCOUNTER — Ambulatory Visit: Payer: Self-pay | Admitting: Gerontology

## 2020-08-12 VITALS — BP 120/78 | HR 78 | Temp 97.8°F | Resp 18 | Ht 71.0 in | Wt 141.5 lb

## 2020-08-12 DIAGNOSIS — I10 Essential (primary) hypertension: Secondary | ICD-10-CM

## 2020-08-12 DIAGNOSIS — B070 Plantar wart: Secondary | ICD-10-CM

## 2020-08-12 DIAGNOSIS — R600 Localized edema: Secondary | ICD-10-CM | POA: Insufficient documentation

## 2020-08-12 MED ORDER — CHLORTHALIDONE 25 MG PO TABS
12.5000 mg | ORAL_TABLET | Freq: Every day | ORAL | 2 refills | Status: DC
  Filled 2020-08-12: qty 15, 30d supply, fill #0
  Filled 2020-09-22: qty 15, 30d supply, fill #1

## 2020-08-12 MED ORDER — CARVEDILOL 12.5 MG PO TABS
12.5000 mg | ORAL_TABLET | Freq: Two times a day (BID) | ORAL | 1 refills | Status: DC
Start: 2020-08-12 — End: 2020-10-10
  Filled 2020-08-12 – 2020-09-09 (×2): qty 60, 30d supply, fill #0
  Filled 2020-10-08: qty 60, 30d supply, fill #1

## 2020-08-12 MED ORDER — SALICYLIC ACID 0.5 % EX LOTN: TOPICAL_LOTION | CUTANEOUS | 0 refills | Status: DC

## 2020-08-12 NOTE — Patient Instructions (Signed)
https://www.nhlbi.nih.gov/files/docs/public/heart/dash_brief.pdf">  DASH Eating Plan DASH stands for Dietary Approaches to Stop Hypertension. The DASH eating plan is a healthy eating plan that has been shown to: Reduce high blood pressure (hypertension). Reduce your risk for type 2 diabetes, heart disease, and stroke. Help with weight loss. What are tips for following this plan? Reading food labels Check food labels for the amount of salt (sodium) per serving. Choose foods with less than 5 percent of the Daily Value of sodium. Generally, foods with less than 300 milligrams (mg) of sodium per serving fit into this eating plan. To find whole grains, look for the word "whole" as the first word in the ingredient list. Shopping Buy products labeled as "low-sodium" or "no salt added." Buy fresh foods. Avoid canned foods and pre-made or frozen meals. Cooking Avoid adding salt when cooking. Use salt-free seasonings or herbs instead of table salt or sea salt. Check with your health care provider or pharmacist before using salt substitutes. Do not fry foods. Cook foods using healthy methods such as baking, boiling, grilling, roasting, and broiling instead. Cook with heart-healthy oils, such as olive, canola, avocado, soybean, or sunflower oil. Meal planning  Eat a balanced diet that includes: 4 or more servings of fruits and 4 or more servings of vegetables each day. Try to fill one-half of your plate with fruits and vegetables. 6-8 servings of whole grains each day. Less than 6 oz (170 g) of lean meat, poultry, or fish each day. A 3-oz (85-g) serving of meat is about the same size as a deck of cards. One egg equals 1 oz (28 g). 2-3 servings of low-fat dairy each day. One serving is 1 cup (237 mL). 1 serving of nuts, seeds, or beans 5 times each week. 2-3 servings of heart-healthy fats. Healthy fats called omega-3 fatty acids are found in foods such as walnuts, flaxseeds, fortified milks, and eggs.  These fats are also found in cold-water fish, such as sardines, salmon, and mackerel. Limit how much you eat of: Canned or prepackaged foods. Food that is high in trans fat, such as some fried foods. Food that is high in saturated fat, such as fatty meat. Desserts and other sweets, sugary drinks, and other foods with added sugar. Full-fat dairy products. Do not salt foods before eating. Do not eat more than 4 egg yolks a week. Try to eat at least 2 vegetarian meals a week. Eat more home-cooked food and less restaurant, buffet, and fast food.  Lifestyle When eating at a restaurant, ask that your food be prepared with less salt or no salt, if possible. If you drink alcohol: Limit how much you use to: 0-1 drink a day for women who are not pregnant. 0-2 drinks a day for men. Be aware of how much alcohol is in your drink. In the [place] one drink equals one 12 oz bottle of beer (355 mL), one 5 oz glass of wine (148 mL), or one 1 oz glass of hard liquor (44 mL). General information Avoid eating more than 2,300 mg of salt a day. If you have hypertension, you may need to reduce your sodium intake to 1,500 mg a day. Work with your health care provider to maintain a healthy body weight or to lose weight. Ask what an ideal weight is for you. Get at least 30 minutes of exercise that causes your heart to beat faster (aerobic exercise) most days of the week. Activities may include walking, swimming, or biking. Work with your health care provider   or dietitian to adjust your eating plan to your individual calorie needs. What foods should I eat? Fruits All fresh, dried, or frozen fruit. Canned fruit in natural juice (without addedsugar). Vegetables Fresh or frozen vegetables (raw, steamed, roasted, or grilled). Low-sodium or reduced-sodium tomato and vegetable juice. Low-sodium or reduced-sodium tomatosauce and tomato paste. Low-sodium or reduced-sodium canned vegetables. Grains Whole-grain or  whole-wheat bread. Whole-grain or whole-wheat pasta. Brown rice. Oatmeal. Quinoa. Bulgur. Whole-grain and low-sodium cereals. Pita bread.Low-fat, low-sodium crackers. Whole-wheat flour tortillas. Meats and other proteins Skinless chicken or turkey. Ground chicken or turkey. Pork with fat trimmed off. Fish and seafood. Egg whites. Dried beans, peas, or lentils. Unsalted nuts, nut butters, and seeds. Unsalted canned beans. Lean cuts of beef with fat trimmed off. Low-sodium, lean precooked or cured meat, such as sausages or meatloaves. Dairy Low-fat (1%) or fat-free (skim) milk. Reduced-fat, low-fat, or fat-free cheeses. Nonfat, low-sodium ricotta or cottage cheese. Low-fat or nonfatyogurt. Low-fat, low-sodium cheese. Fats and oils Soft margarine without trans fats. Vegetable oil. Reduced-fat, low-fat, or light mayonnaise and salad dressings (reduced-sodium). Canola, safflower, olive, avocado, soybean, andsunflower oils. Avocado. Seasonings and condiments Herbs. Spices. Seasoning mixes without salt. Other foods Unsalted popcorn and pretzels. Fat-free sweets. The items listed above may not be a complete list of foods and beverages you can eat. Contact a dietitian for more information. What foods should I avoid? Fruits Canned fruit in a light or heavy syrup. Fried fruit. Fruit in cream or buttersauce. Vegetables Creamed or fried vegetables. Vegetables in a cheese sauce. Regular canned vegetables (not low-sodium or reduced-sodium). Regular canned tomato sauce and paste (not low-sodium or reduced-sodium). Regular tomato and vegetable juice(not low-sodium or reduced-sodium). Pickles. Olives. Grains Baked goods made with fat, such as croissants, muffins, or some breads. Drypasta or rice meal packs. Meats and other proteins Fatty cuts of meat. Ribs. Fried meat. Bacon. Bologna, salami, and other precooked or cured meats, such as sausages or meat loaves. Fat from the back of a pig (fatback). Bratwurst.  Salted nuts and seeds. Canned beans with added salt. Canned orsmoked fish. Whole eggs or egg yolks. Chicken or turkey with skin. Dairy Whole or 2% milk, cream, and half-and-half. Whole or full-fat cream cheese. Whole-fat or sweetened yogurt. Full-fat cheese. Nondairy creamers. Whippedtoppings. Processed cheese and cheese spreads. Fats and oils Butter. Stick margarine. Lard. Shortening. Ghee. Bacon fat. Tropical oils, suchas coconut, palm kernel, or palm oil. Seasonings and condiments Onion salt, garlic salt, seasoned salt, table salt, and sea salt. Worcestershire sauce. Tartar sauce. Barbecue sauce. Teriyaki sauce. Soy sauce, including reduced-sodium. Steak sauce. Canned and packaged gravies. Fish sauce. Oyster sauce. Cocktail sauce. Store-bought horseradish. Ketchup. Mustard. Meat flavorings and tenderizers. Bouillon cubes. Hot sauces. Pre-made or packaged marinades. Pre-made or packaged taco seasonings. Relishes. Regular saladdressings. Other foods Salted popcorn and pretzels. The items listed above may not be a complete list of foods and beverages you should avoid. Contact a dietitian for more information. Where to find more information National Heart, Lung, and Blood Institute: www.nhlbi.nih.gov American Heart Association: www.heart.org Academy of Nutrition and Dietetics: www.eatright.org National Kidney Foundation: www.kidney.org Summary The DASH eating plan is a healthy eating plan that has been shown to reduce high blood pressure (hypertension). It may also reduce your risk for type 2 diabetes, heart disease, and stroke. When on the DASH eating plan, aim to eat more fresh fruits and vegetables, whole grains, lean proteins, low-fat dairy, and heart-healthy fats. With the DASH eating plan, you should limit salt (sodium) intake to 2,300   mg a day. If you have hypertension, you may need to reduce your sodium intake to 1,500 mg a day. Work with your health care provider or dietitian to adjust  your eating plan to your individual calorie needs. This information is not intended to replace advice given to you by your health care provider. Make sure you discuss any questions you have with your healthcare provider. Document Revised: 11/29/2018 Document Reviewed: 11/29/2018 Elsevier Patient Education  2022 Elsevier Inc.  

## 2020-08-12 NOTE — Progress Notes (Signed)
Established Patient Office Visit  Subjective:  Patient ID: Bryan Herring, male    DOB: 1960-10-13  Age: 60 y.o. MRN: [id number]  CC:  Chief Complaint  Patient presents with   Follow-up    Foot pain, pt out of prescription lotion    HPI Bryan Herring  is a 60 year old male who has history of hypertension, substance abuse, deaf to left ear  presents for routine follow-up visit.  He states that his back pain has resolved completely. Currently, he also c/o swelling to bilateral ankle and lower leg which started after he was taken off hctz because of hypotension. He states that swelling moderately improves with elevating his leg.  He denies claudication, and erythema, shortness of breath and dizziness. He also c/o the painful chronic callus to the plantar section of his dropped arch of his right foot making it difficult for him to wear shoes.  Overall, he states that he is doing well and offers no further complaints.  Past Medical History:  Diagnosis Date   Deaf    left ear   Essential hypertension 03/01/2018   Substance abuse ([place])     Past Surgical History:  Procedure Laterality Date   ACHILLES TENDON SURGERY      Family History  Problem Relation Age of Onset   Aneurysm Mother    Other Father        unknown medical history   Hypertension Brother    Other Maternal Grandmother        unknown medical history   Other Maternal Grandfather        unknown medical history   Other Paternal Grandmother        unknown medical history   Other Paternal Grandfather        unknown medical history   Hypertension Maternal Aunt     Social History   Socioeconomic History   Marital status: Single    Spouse name: Not on file   Number of children: Not on file   Years of education: Not on file   Highest education level: Not on file  Occupational History   Not on file  Tobacco Use   Smoking status: Former    Packs/day: 0.25    Types: Cigarettes    Quit date:  04/2020    Years since quitting: 0.3   Smokeless tobacco: Never   Tobacco comments:    Patient wearing nicotine patch  Vaping Use   Vaping Use: Never used  Substance and Sexual Activity   Alcohol use: Not Currently    Comment: last use April 14, 2020 (resident at [facility])   Drug use: Not Currently    Types: Marijuana, Heroin    Comment: last use April 14, 2020 (resident at [facility])   Sexual activity: Not Currently  Other Topics Concern   Not on file  Social History Narrative   Not on file   Social Determinants of Health   Financial Resource Strain: Not on file  Food Insecurity: Food Insecurity Present   Worried About Charity fundraiser in the Last Year: Sometimes true   Arboriculturist in the Last Year: Sometimes true  Transportation Needs: Unmet Transportation Needs   Lack of Transportation (Medical): Yes   Lack of Transportation (Non-Medical): Yes  Physical Activity: Not on file  Stress: Not on file  Social Connections: Not on file  Intimate Partner Violence: Not on file    Outpatient Medications Prior to Visit  Medication Sig Dispense Refill  melatonin 5 MG TABS Take 2 tablets (10 mg total) by mouth at bedtime. 60 tablet 1   nicotine (NICODERM CQ - DOSED IN MG/24 HOURS) 14 mg/24hr patch Place 14 mg onto the skin daily.     carvedilol (COREG) 12.5 MG tablet Take 1 tablet (12.5 mg total) by mouth 2 (two) times daily with a meal. 60 tablet 1   ibuprofen (ADVIL) 800 MG tablet Take 1 tablet (800 mg total) by mouth 2 (two) times daily. 30 tablet 0   Salicylic Acid 0.5 % LOTN Apply to plantars warts twice a day 120 mL 0   docusate sodium (COLACE) 100 MG capsule Take 100 mg by mouth daily as needed for mild constipation. (Patient not taking: No sig reported)     No facility-administered medications prior to visit.    Allergies  Allergen Reactions   No Known Allergies     ROS Review of Systems  Constitutional: Negative.   Respiratory: Negative.    Cardiovascular:  Positive  for leg swelling (bilateral lower extremity).  Skin: Negative.   Neurological: Negative.      Objective:    Physical Exam HENT:     Head: Normocephalic and atraumatic.     Mouth/Throat:     Mouth: Mucous membranes are moist.  Eyes:     Extraocular Movements: Extraocular movements intact.     Conjunctiva/sclera: Conjunctivae normal.     Pupils: Pupils are equal, round, and reactive to light.  Cardiovascular:     Rate and Rhythm: Normal rate and regular rhythm.     Pulses: Normal pulses.     Heart sounds: Normal heart sounds.  Pulmonary:     Effort: Pulmonary effort is normal.     Breath sounds: Normal breath sounds.  Musculoskeletal:        General: Swelling (+1 edema to bialteral lower extrimity.) present.     Right lower leg: No edema.  Neurological:     General: No focal deficit present.     Mental Status: He is alert and oriented to person, place, and time. Mental status is at baseline.    BP 120/78 (BP Location: Left Arm, Patient Position: Sitting, Cuff Size: Normal)   Pulse 78   Temp 97.8 F (36.6 C)   Resp 18   Ht '5\' 11"'  (1.803 m)   Wt 141 lb 8 oz (64.2 kg)   SpO2 98%   BMI 19.74 kg/m  Wt Readings from Last 3 Encounters:  08/12/20 141 lb 8 oz (64.2 kg)  08/04/20 140 lb 8 oz (63.7 kg)  07/22/20 133 lb 3.2 oz (60.4 kg)     Health Maintenance Due  Topic Date Due   COVID-19 Vaccine (1) Never done   Pneumococcal Vaccine 14-47 Years old (1 - PCV) Never done   Hepatitis C Screening  Never done   TETANUS/TDAP  Never done   Zoster Vaccines- Shingrix (1 of 2) Never done   COLONOSCOPY (Pts 45-73yr Insurance coverage will need to be confirmed)  Never done   INFLUENZA VACCINE  08/09/2020    There are no preventive care reminders to display for this patient.  Lab Results  Component Value Date   TSH 1.589 02/03/2019   Lab Results  Component Value Date   WBC 7.9 07/21/2020   HGB 11.3 (L) 07/21/2020   HCT 33.3 (L) 07/21/2020   MCV 91.5 07/21/2020   PLT 207  07/21/2020   Lab Results  Component Value Date   NA 140 07/21/2020   K 3.6 07/21/2020  CO2 29 07/21/2020   GLUCOSE 115 (H) 07/21/2020   BUN 33 (H) 07/21/2020   CREATININE 0.89 07/21/2020   BILITOT 0.3 04/28/2020   ALKPHOS 73 04/28/2020   AST 15 04/28/2020   ALT 30 04/28/2020   PROT 7.4 04/28/2020   ALBUMIN 4.3 04/28/2020   CALCIUM 10.0 07/21/2020   ANIONGAP 10 07/21/2020   EGFR 95 04/28/2020   No results found for: CHOL No results found for: HDL No results found for: LDLCALC No results found for: TRIG No results found for: CHOLHDL Lab Results  Component Value Date   HGBA1C 5.7 (H) 02/04/2019      Assessment & Plan:    1. Plantar warts -He will continue using salicylic acid lotion to plantar warts, and was advised to complete [facility] financial application for podiatry referral. - Salicylic Acid 0.5 % LOTN; Apply to plantars warts twice a day  Dispense: 120 mL; Refill: 0  2. Essential hypertension -His blood pressure is under control and he will continue current medication, DASH diet and exercise as tolerated. - carvedilol (COREG) 12.5 MG tablet; Take 1 tablet (12.5 mg total) by mouth 2 (two) times daily with a meal.  Dispense: 60 tablet; Refill: 1 - chlorthalidone (HYGROTON) 25 MG tablet; Take (1/2) tablet (12.5 mg total) by mouth once daily.  Dispense: 15 tablet; Refill: 2  3. Bilateral edema of lower extremity -He will continue on 12.5 mg chlorthalidone daily, educated on medication side effects and advised to notify clinic.  He was also encouraged to elevate legs while sitting down and to notify clinic or go to the emergency room for worsening edema. - chlorthalidone (HYGROTON) 25 MG tablet; Take (1/2) tablet (12.5 mg total) by mouth once daily.  Dispense: 15 tablet; Refill: 2    Follow-up: Return in about 6 weeks (around 09/23/2020), or if symptoms worsen or fail to improve.    Syndi Pua Jerold Coombe, NP

## 2020-08-13 ENCOUNTER — Other Ambulatory Visit: Payer: Self-pay

## 2020-09-02 ENCOUNTER — Ambulatory Visit (INDEPENDENT_AMBULATORY_CARE_PROVIDER_SITE_OTHER): Payer: No Typology Code available for payment source | Admitting: Podiatry

## 2020-09-02 ENCOUNTER — Other Ambulatory Visit: Payer: Self-pay

## 2020-09-02 DIAGNOSIS — Q828 Other specified congenital malformations of skin: Secondary | ICD-10-CM

## 2020-09-02 MED ORDER — CLOTRIMAZOLE-BETAMETHASONE 1-0.05 % EX CREA
1.0000 "application " | TOPICAL_CREAM | Freq: Every day | CUTANEOUS | 0 refills | Status: DC
  Filled 2020-09-02: qty 30, 30d supply, fill #0

## 2020-09-02 NOTE — Progress Notes (Signed)
Subjective:  Patient ID: Bryan Herring, male    DOB: 1960/07/03,  MRN: [id number]  Chief Complaint  Patient presents with   Callouses    Bilateral callus     60 y.o. male presents with the above complaint.  Patient presents with complaint of bilateral hyperkeratotic lesions/porokeratosis to plantar TN joint.  Patient has severe flatfoot with compensated collapse of the midfoot.  Patient stated hurts to ambulate he has been walking on his feet forever.  He states that the calluses get bigger and thicker and cause a lot of pain.  Denies any other acute complaints he would like to have them debrided down.  He also would like to have refill on Lotrisone cream which seems to help soften the callus.   Review of Systems: Negative except as noted in the HPI. Denies N/V/F/Ch.  Past Medical History:  Diagnosis Date   Deaf    left ear   Essential hypertension 03/01/2018   Substance abuse ([place])     Current Outpatient Medications:    clotrimazole-betamethasone (LOTRISONE) cream, Apply 1 application topically daily., Disp: 30 g, Rfl: 0   carvedilol (COREG) 12.5 MG tablet, Take 1 tablet (12.5 mg total) by mouth 2 (two) times daily with a meal., Disp: 60 tablet, Rfl: 1   chlorthalidone (HYGROTON) 25 MG tablet, Take (1/2) tablet (12.5 mg total) by mouth once daily., Disp: 15 tablet, Rfl: 2   docusate sodium (COLACE) 100 MG capsule, Take 100 mg by mouth daily as needed for mild constipation. (Patient not taking: No sig reported), Disp: , Rfl:    melatonin 5 MG TABS, Take 2 tablets (10 mg total) by mouth at bedtime., Disp: 60 tablet, Rfl: 1   nicotine (NICODERM CQ - DOSED IN MG/24 HOURS) 14 mg/24hr patch, Place 14 mg onto the skin daily., Disp: , Rfl:    Salicylic Acid 0.5 % LOTN, Apply to plantars warts twice a day, Disp: 120 mL, Rfl: 0  Social History   Tobacco Use  Smoking Status Former   Packs/day: 0.25   Types: Cigarettes   Quit date: 04/2020   Years since quitting: 0.4  Smokeless  Tobacco Never  Tobacco Comments   Patient wearing nicotine patch    Allergies  Allergen Reactions   No Known Allergies    Objective:  There were no vitals filed for this visit. There is no height or weight on file to calculate BMI. Constitutional Well developed. Well nourished.  Vascular Dorsalis pedis pulses palpable bilaterally. Posterior tibial pulses palpable bilaterally. Capillary refill normal to all digits.  No cyanosis or clubbing noted. Pedal hair growth normal.  Neurologic Normal speech. Oriented to person, place, and time. Epicritic sensation to light touch grossly present bilaterally.  Dermatologic Hyperkeratotic lesion with central nucleated core noted to bilateral plantar hindfoot at the TN joint.  Pain on palpation to the lesion.  Upon debridement no pinpoint bleeding noted  Orthopedic: Normal joint ROM without pain or crepitus bilaterally. No visible deformities. No bony tenderness.   Radiographs: None Assessment:   1. Porokeratosis    Plan:  Patient was evaluated and treated and all questions answered.  Bilateral plantar hindfoot porokeratosis -I explained the patient the etiology of porokeratosis and various treatment options was discussed.  Given the amount of pain that is having I believe he will benefit from debridement of the lesion.  Using chisel blade and handle the lesion was debrided down to healthy striated tissue.  No complication noted no pinpoint bleeding noted. -I discussed with him that  shoe gear modification would help he has severe pes planovalgus foot structure leading to compensation and callus formation.  In the past Lotrisone cream has helped with his callus as well as athlete's foot.  I refilled his Lotrisone cream.  No follow-ups on file.

## 2020-09-03 ENCOUNTER — Other Ambulatory Visit: Payer: Self-pay

## 2020-09-09 ENCOUNTER — Other Ambulatory Visit: Payer: Self-pay

## 2020-09-22 ENCOUNTER — Other Ambulatory Visit: Payer: Self-pay

## 2020-09-22 ENCOUNTER — Ambulatory Visit: Payer: Self-pay | Admitting: Gerontology

## 2020-09-22 ENCOUNTER — Encounter: Payer: Self-pay | Admitting: Gerontology

## 2020-09-22 VITALS — BP 117/81 | HR 72 | Temp 97.1°F | Resp 16 | Ht 71.0 in | Wt 134.6 lb

## 2020-09-22 DIAGNOSIS — H538 Other visual disturbances: Secondary | ICD-10-CM | POA: Insufficient documentation

## 2020-09-22 DIAGNOSIS — Z0000 Encounter for general adult medical examination without abnormal findings: Secondary | ICD-10-CM | POA: Insufficient documentation

## 2020-09-22 NOTE — Progress Notes (Signed)
Established Patient Office Visit  Subjective:  Patient ID: Bryan Herring, male    DOB: 1960/11/08  Age: 60 y.o. MRN: [id number]  CC:  Chief Complaint  Patient presents with   Follow-up    HPI Bryan Herring  is a 60 year old male who has history of hypertension, substance abuse, deaf to left ear  presents for routine follow-up visit. He was seen by the Podiatrist Dr Keith Rake on 09/02/20 for bilateral hyperkeratosis/porokeratosis to plantar TN joint. His lesion was debrided down to healthy striated tissue. He states that his foot pain has improved 80% and he's able to wear shoes. He checks his blood pressure at [facility] and it's usually less than 140/90. He denies chest pain, palpitation, light headedness, and vision changes. He will schedule an appointment with Cardiologist. He requests Colonoscopy screening and states that his experiencing occasional blurry vision,but denies eye pain. Overall, he states that he's doing well and offers no further complaint.  Past Medical History:  Diagnosis Date   Deaf    left ear   Essential hypertension 03/01/2018   Substance abuse ([place])     Past Surgical History:  Procedure Laterality Date   ACHILLES TENDON SURGERY      Family History  Problem Relation Age of Onset   Aneurysm Mother    Other Father        unknown medical history   Hypertension Brother    Other Maternal Grandmother        unknown medical history   Other Maternal Grandfather        unknown medical history   Other Paternal Grandmother        unknown medical history   Other Paternal Grandfather        unknown medical history   Hypertension Maternal Aunt     Social History   Socioeconomic History   Marital status: Single    Spouse name: Not on file   Number of children: Not on file   Years of education: Not on file   Highest education level: Not on file  Occupational History   Not on file  Tobacco Use   Smoking status: Former    Packs/day: 0.25     Types: Cigarettes    Quit date: 04/2020    Years since quitting: 0.4   Smokeless tobacco: Never   Tobacco comments:    Patient wearing nicotine patch  Vaping Use   Vaping Use: Never used  Substance and Sexual Activity   Alcohol use: Not Currently    Comment: last use April 14, 2020 (resident at [facility])   Drug use: Not Currently    Types: Marijuana, Heroin    Comment: last use April 14, 2020 (resident at [facility])   Sexual activity: Not Currently  Other Topics Concern   Not on file  Social History Narrative   Not on file   Social Determinants of Health   Financial Resource Strain: Not on file  Food Insecurity: Food Insecurity Present   Worried About Charity fundraiser in the Last Year: Sometimes true   Arboriculturist in the Last Year: Sometimes true  Transportation Needs: Unmet Transportation Needs   Lack of Transportation (Medical): Yes   Lack of Transportation (Non-Medical): Yes  Physical Activity: Not on file  Stress: Not on file  Social Connections: Not on file  Intimate Partner Violence: Not on file    Outpatient Medications Prior to Visit  Medication Sig Dispense Refill   carvedilol (COREG) 12.5 MG  tablet Take 1 tablet (12.5 mg total) by mouth 2 (two) times daily with a meal. 60 tablet 1   clotrimazole-betamethasone (LOTRISONE) cream Apply 1 application topically once daily. 30 g 0   melatonin 5 MG TABS Take 2 tablets (10 mg total) by mouth at bedtime. 60 tablet 1   nicotine (NICODERM CQ - DOSED IN MG/24 HOURS) 14 mg/24hr patch Place 14 mg onto the skin daily.     Salicylic Acid 0.5 % LOTN Apply to plantars warts twice a day 120 mL 0   chlorthalidone (HYGROTON) 25 MG tablet Take (1/2) tablet (12.5 mg total) by mouth once daily. 15 tablet 2   docusate sodium (COLACE) 100 MG capsule Take 100 mg by mouth daily as needed for mild constipation. (Patient not taking: No sig reported)     No facility-administered medications prior to visit.    Allergies  Allergen Reactions   No  Known Allergies     ROS Review of Systems  Constitutional: Negative.   Eyes:  Positive for visual disturbance (blurry vision).  Respiratory: Negative.    Cardiovascular: Negative.   Neurological: Negative.      Objective:    Physical Exam HENT:     Head: Normocephalic and atraumatic.     Mouth/Throat:     Mouth: Mucous membranes are moist.  Eyes:     Extraocular Movements: Extraocular movements intact.     Conjunctiva/sclera: Conjunctivae normal.     Pupils: Pupils are equal, round, and reactive to light.  Cardiovascular:     Rate and Rhythm: Normal rate and regular rhythm.     Pulses: Normal pulses.     Heart sounds: Normal heart sounds.  Pulmonary:     Effort: Pulmonary effort is normal.     Breath sounds: Normal breath sounds.  Neurological:     General: No focal deficit present.     Mental Status: He is alert and oriented to person, place, and time. Mental status is at baseline.    BP 117/81 (BP Location: Left Arm, Patient Position: Sitting, Cuff Size: Large)   Pulse 72   Temp (!) 97.1 F (36.2 C)   Resp 16   Ht '5\' 11"'  (1.803 m)   Wt 134 lb 9.6 oz (61.1 kg)   SpO2 98%   BMI 18.77 kg/m  Wt Readings from Last 3 Encounters:  09/22/20 134 lb 9.6 oz (61.1 kg)  08/12/20 141 lb 8 oz (64.2 kg)  08/04/20 140 lb 8 oz (63.7 kg)   He was encouraged to increase his caloric intake  Health Maintenance Due  Topic Date Due   COVID-19 Vaccine (1) Never done   Pneumococcal Vaccine 87-59 Years old (1 - PCV) Never done   Hepatitis C Screening  Never done   TETANUS/TDAP  Never done   Zoster Vaccines- Shingrix (1 of 2) Never done   COLONOSCOPY (Pts 45-78yr Insurance coverage will need to be confirmed)  Never done   INFLUENZA VACCINE  Never done    There are no preventive care reminders to display for this patient.  Lab Results  Component Value Date   TSH 1.589 02/03/2019   Lab Results  Component Value Date   WBC 7.9 07/21/2020   HGB 11.3 (L) 07/21/2020   HCT 33.3  (L) 07/21/2020   MCV 91.5 07/21/2020   PLT 207 07/21/2020   Lab Results  Component Value Date   NA 140 07/21/2020   K 3.6 07/21/2020   CO2 29 07/21/2020   GLUCOSE 115 (H) 07/21/2020  BUN 33 (H) 07/21/2020   CREATININE 0.89 07/21/2020   BILITOT 0.3 04/28/2020   ALKPHOS 73 04/28/2020   AST 15 04/28/2020   ALT 30 04/28/2020   PROT 7.4 04/28/2020   ALBUMIN 4.3 04/28/2020   CALCIUM 10.0 07/21/2020   ANIONGAP 10 07/21/2020   EGFR 95 04/28/2020   No results found for: CHOL No results found for: HDL No results found for: LDLCALC No results found for: TRIG No results found for: CHOLHDL Lab Results  Component Value Date   HGBA1C 5.7 (H) 02/04/2019      Assessment & Plan:    1. Health care maintenance - His [facility] charity care is active, he will follow up with Gastroenterology for Colonoscopy screening. - Ambulatory referral to Gastroenterology  2. Blurry vision - He was encouraged to complete [facility] application for referral and to go to the ED for worsening symptoms. - Ambulatory referral to Ophthalmology    Follow-up: Return in about 2 months (around 11/23/2020), or if symptoms worsen or fail to improve.    Antonie Borjon Jerold Coombe, NP

## 2020-09-22 NOTE — Patient Instructions (Signed)

## 2020-09-27 NOTE — Progress Notes (Signed)
Unable to contact patient. Letter will be sent out.

## 2020-10-04 ENCOUNTER — Other Ambulatory Visit: Payer: Self-pay

## 2020-10-04 ENCOUNTER — Encounter: Payer: Self-pay | Admitting: Cardiology

## 2020-10-04 ENCOUNTER — Ambulatory Visit (INDEPENDENT_AMBULATORY_CARE_PROVIDER_SITE_OTHER): Payer: Self-pay | Admitting: Cardiology

## 2020-10-04 VITALS — BP 130/86 | HR 69 | Ht 71.0 in | Wt 134.0 lb

## 2020-10-04 DIAGNOSIS — I10 Essential (primary) hypertension: Secondary | ICD-10-CM

## 2020-10-04 DIAGNOSIS — I5020 Unspecified systolic (congestive) heart failure: Secondary | ICD-10-CM

## 2020-10-04 MED ORDER — LOSARTAN POTASSIUM 25 MG PO TABS
25.0000 mg | ORAL_TABLET | Freq: Every day | ORAL | 1 refills | Status: DC
  Filled 2020-10-04: qty 90, 90d supply, fill #0
  Filled 2021-01-10: qty 90, 90d supply, fill #1

## 2020-10-04 NOTE — Patient Instructions (Signed)
Medication Instructions:   Your physician has recommended you make the following change in your medication:    START taking Losartan 25 MG once a day.  *If you need a refill on your cardiac medications before your next appointment, please call your pharmacy*   Lab Work: None ordered If you have labs (blood work) drawn today and your tests are completely normal, you will receive your results only by: [place] (if you have MyChart) OR A paper copy in the mail If you have any lab test that is abnormal or we need to change your treatment, we will call you to review the results.   Testing/Procedures:   Your physician has requested that you have an echocardiogram. Echocardiography is a painless test that uses sound waves to create images of your heart. It provides your doctor with information about the size and shape of your heart and how well your heart's chambers and valves are working. This procedure takes approximately one hour. There are no restrictions for this procedure.  2.  [place]       Your caregiver has ordered a Stress Test with nuclear imaging. The purpose of this test is to evaluate the blood supply to your heart muscle. This procedure is referred to as a "Non-Invasive Stress Test." This is because other than having an IV started in your vein, nothing is inserted or "invades" your body. Cardiac stress tests are done to find areas of poor blood flow to the heart by determining the extent of coronary artery disease (CAD). Some patients exercise on a treadmill, which naturally increases the blood flow to your heart, while others who are  unable to walk on a treadmill due to physical limitations have a pharmacologic/chemical stress agent called Lexiscan . This medicine will mimic walking on a treadmill by temporarily increasing your coronary blood flow.      PLEASE REPORT TO [facility] MEDICAL MALL ENTRANCE   THE VOLUNTEERS AT THE FIRST DESK WILL DIRECT YOU WHERE TO GO      *Please note: these test may take anywhere between 2-4 hours to complete       Date of Procedure:_____________________________________   Arrival Time for Procedure:______________________________    PLEASE NOTIFY THE OFFICE AT LEAST 24 HOURS IN ADVANCE IF YOU ARE UNABLE TO KEEP YOUR APPOINTMENT.  [place] 24 HOURS IN ADVANCE IF YOU ARE UNABLE TO KEEP YOUR APPOINTMENT. [phone]       How to prepare for your Myoview test:    _XX___:  Hold betablocker(s) night before procedure and morning of procedure:   carvedilol (COREG)    1. Do not eat or drink after midnight  2. No caffeine for 24 hours prior to test  3. No smoking 24 hours prior to test.  4. Unless instructed otherwise, Take your medication with a small sips of water.    5.         Ladies, please do not wear dresses. Skirts or pants are appropriate. Please wear a short sleeve shirt.  6. No perfume, cologne or lotion.  7. Wear comfortable walking shoes. No heels!     Follow-Up: At [facility], you and your health needs are our priority.  As part of our continuing mission to provide you with exceptional heart care, we have created designated Provider Care Teams.  These Care Teams include your primary Cardiologist (physician) and Advanced Practice Providers (APPs -  Physician Assistants and Nurse Practitioners) who all work  together to provide you with the care you need, when you need it.  We recommend signing up for the patient portal called "MyChart".  Sign up information is provided on this After Visit Summary.  MyChart is used to connect with patients for Virtual Visits (Telemedicine).  Patients are able to view lab/test results, encounter notes, upcoming appointments, etc.  Non-urgent messages can be sent to your provider as well.   To learn more about what you can do with MyChart, go to [email or web address].    Your next appointment:   2 month(s)  The  format for your next appointment:   In Person  Provider:    Kate Sable, MD  ONLY   Other Instructions

## 2020-10-04 NOTE — Progress Notes (Signed)
Cardiology Office Note:    Date:  10/04/2020   ID:  Thornell Mule Queenan, DOB 04-18-1960, MRN [id number]  PCP:  Langston Reusing, NP   [facility] Providers Cardiologist:  Kate Sable, MD     Referring MD: Langston Reusing, NP   Chief Complaint  Patient presents with   New Patient (Initial Visit)    Referred by PCP for  left ventricular hypertrophy. Meds reviewed verbally with patient.     History of Present Illness:    Bryan Herring is a 60 y.o. male with a hx of left ear deafness, hypertension, prior substance abuse (heroin, coccaine x 30+yrs) who presents for follow-up.  Previously seen at our [place] location in 2020.  He had episodes of syncope at the time.  Cardiac monitor was recommended but this was declined per patient according to reports.  Echocardiogram was obtained.  He used heroin and crack cocaine for over 30 years.  Stop using in April 2022 after checking into rehab program.  He denies chest pain or shortness of breath or edema.  Previously was placed on chlorthalidone, but stopped due to dehydration.  Echo 01/2019 EF 40 to 45%, impaired relaxation, no LVH.  Past Medical History:  Diagnosis Date   Deaf    left ear   Essential hypertension 03/01/2018   Substance abuse ([place])     Past Surgical History:  Procedure Laterality Date   ACHILLES TENDON SURGERY      Current Medications: Current Meds  Medication Sig   carvedilol (COREG) 12.5 MG tablet Take 1 tablet (12.5 mg total) by mouth 2 (two) times daily with a meal.   clotrimazole-betamethasone (LOTRISONE) cream Apply 1 application topically once daily.   losartan (COZAAR) 25 MG tablet Take 1 tablet (25 mg total) by mouth daily.   melatonin 5 MG TABS Take 2 tablets (10 mg total) by mouth at bedtime.   nicotine (NICODERM CQ - DOSED IN MG/24 HOURS) 14 mg/24hr patch Place 14 mg onto the skin daily.   Salicylic Acid 0.5 % LOTN Apply to plantars warts twice a day     Allergies:    No known allergies   Social History   Socioeconomic History   Marital status: Single    Spouse name: Not on file   Number of children: Not on file   Years of education: Not on file   Highest education level: Not on file  Occupational History   Not on file  Tobacco Use   Smoking status: Former    Packs/day: 0.25    Types: Cigarettes    Quit date: 04/2020    Years since quitting: 0.4   Smokeless tobacco: Never   Tobacco comments:    Patient wearing nicotine patch  Vaping Use   Vaping Use: Never used  Substance and Sexual Activity   Alcohol use: Not Currently    Comment: last use April 14, 2020 (resident at [facility])   Drug use: Not Currently    Types: Marijuana, Heroin    Comment: last use April 14, 2020 (resident at [facility])   Sexual activity: Not Currently  Other Topics Concern   Not on file  Social History Narrative   Not on file   Social Determinants of Health   Financial Resource Strain: Not on file  Food Insecurity: Food Insecurity Present   Worried About [place] in the Last Year: Sometimes true   Ran Out of Food in the Last Year: Sometimes true  Transportation Needs: Scientific laboratory technician  Needs   Lack of Transportation (Medical): Yes   Lack of Transportation (Non-Medical): Yes  Physical Activity: Not on file  Stress: Not on file  Social Connections: Not on file     Family History: The patient's family history includes Aneurysm in his mother; Hypertension in his brother and maternal aunt; Other in his father, maternal grandfather, maternal grandmother, paternal grandfather, and paternal grandmother.  ROS:   Please see the history of present illness.     All other systems reviewed and are negative.  EKGs/Labs/Other Studies Reviewed:    The following studies were reviewed today:   EKG:  EKG is  ordered today.  The ekg ordered today demonstrates sinus rhythm, PACs  Recent Labs: 04/28/2020: ALT 30 07/21/2020: BUN 33; Creatinine, Ser 0.89; Hemoglobin  11.3; Platelets 207; Potassium 3.6; Sodium 140  Recent Lipid Panel No results found for: CHOL, TRIG, HDL, CHOLHDL, VLDL, LDLCALC, LDLDIRECT   Risk Assessment/Calculations:          Physical Exam:    VS:  BP 130/86 (BP Location: Left Arm, Patient Position: Sitting, Cuff Size: Normal)   Pulse 69   Ht 5\' 11"  (1.803 m)   Wt 134 lb (60.8 kg)   SpO2 96%   BMI 18.69 kg/m     Wt Readings from Last 3 Encounters:  10/04/20 134 lb (60.8 kg)  09/22/20 134 lb 9.6 oz (61.1 kg)  08/12/20 141 lb 8 oz (64.2 kg)     GEN:  Well nourished, well developed in no acute distress HEENT: Normal NECK: No JVD; No carotid bruits LYMPHATICS: No lymphadenopathy CARDIAC: RRR, no murmurs, rubs, gallops RESPIRATORY:  Clear to auscultation without rales, wheezing or rhonchi  ABDOMEN: Soft, non-tender, non-distended MUSCULOSKELETAL:  No edema; No deformity  SKIN: Warm and dry NEUROLOGIC:  Alert and oriented x 3 PSYCHIATRIC:  Normal affect   ASSESSMENT:    1. HFrEF (heart failure with reduced ejection fraction) ([place])   2. Primary hypertension    PLAN:    In order of problems listed above:  Cardiomyopathy, echo 01/2019 with EF 40 to 45%.  Likely nonischemic secondary to prior polysubstance use/cocaine/heroin use.  Repeat echocardiogram, get Lexiscan Myoview to evaluate presence of ischemia.  Continue Coreg 12.5 mg twice daily, start losartan 25mg  qd. Hypertension, continue Coreg, start losartan as above.   Follow-up after echo and Myoview.     Medication Adjustments/Labs and Tests Ordered: Current medicines are reviewed at length with the patient today.  Concerns regarding medicines are outlined above.  Orders Placed This Encounter  Procedures   NM Myocar Multi W/Spect W/Wall Motion / EF   EKG 12-Lead   ECHOCARDIOGRAM COMPLETE    Meds ordered this encounter  Medications   losartan (COZAAR) 25 MG tablet    Sig: Take 1 tablet (25 mg total) by mouth daily.    Dispense:  90 tablet     Refill:  1     Patient Instructions  Medication Instructions:   Your physician has recommended you make the following change in your medication:    START taking Losartan 25 MG once a day.  *If you need a refill on your cardiac medications before your next appointment, please call your pharmacy*   Lab Work: None ordered If you have labs (blood work) drawn today and your tests are completely normal, you will receive your results only by: [place] (if you have MyChart) OR A paper copy in the mail If you have any lab test that is abnormal or we need  to change your treatment, we will call you to review the results.   Testing/Procedures:   Your physician has requested that you have an echocardiogram. Echocardiography is a painless test that uses sound waves to create images of your heart. It provides your doctor with information about the size and shape of your heart and how well your heart's chambers and valves are working. This procedure takes approximately one hour. There are no restrictions for this procedure.  2.  [place]       Your caregiver has ordered a Stress Test with nuclear imaging. The purpose of this test is to evaluate the blood supply to your heart muscle. This procedure is referred to as a "Non-Invasive Stress Test." This is because other than having an IV started in your vein, nothing is inserted or "invades" your body. Cardiac stress tests are done to find areas of poor blood flow to the heart by determining the extent of coronary artery disease (CAD). Some patients exercise on a treadmill, which naturally increases the blood flow to your heart, while others who are  unable to walk on a treadmill due to physical limitations have a pharmacologic/chemical stress agent called Lexiscan . This medicine will mimic walking on a treadmill by temporarily increasing your coronary blood flow.      PLEASE REPORT TO [facility] MEDICAL MALL ENTRANCE   THE VOLUNTEERS AT THE FIRST  DESK WILL DIRECT YOU WHERE TO GO     *Please note: these test may take anywhere between 2-4 hours to complete       Date of Procedure:_____________________________________   Arrival Time for Procedure:______________________________    PLEASE NOTIFY THE OFFICE AT LEAST 24 HOURS IN ADVANCE IF YOU ARE UNABLE TO KEEP YOUR APPOINTMENT.  [place] 24 HOURS IN ADVANCE IF YOU ARE UNABLE TO KEEP YOUR APPOINTMENT. [phone]       How to prepare for your Myoview test:    _XX___:  Hold betablocker(s) night before procedure and morning of procedure:   carvedilol (COREG)    1. Do not eat or drink after midnight  2. No caffeine for 24 hours prior to test  3. No smoking 24 hours prior to test.  4. Unless instructed otherwise, Take your medication with a small sips of water.    5.         Ladies, please do not wear dresses. Skirts or pants are appropriate. Please wear a short sleeve shirt.  6. No perfume, cologne or lotion.  7. Wear comfortable walking shoes. No heels!     Follow-Up: At [facility], you and your health needs are our priority.  As part of our continuing mission to provide you with exceptional heart care, we have created designated Provider Care Teams.  These Care Teams include your primary Cardiologist (physician) and Advanced Practice Providers (APPs -  Physician Assistants and Nurse Practitioners) who all work together to provide you with the care you need, when you need it.  We recommend signing up for the patient portal called "MyChart".  Sign up information is provided on this After Visit Summary.  MyChart is used to connect with patients for Virtual Visits (Telemedicine).  Patients are able to view lab/test results, encounter notes, upcoming appointments, etc.  Non-urgent messages can be sent to your provider as well.   To learn more about what you can do with MyChart, go to [email or web address].    Your next  appointment:   2 month(s)  The format for your next appointment:   In Person  Provider:    Kate Sable, MD  ONLY   Other Instructions     Signed, Kate Sable, MD  10/04/2020 12:45 PM    [place]

## 2020-10-05 ENCOUNTER — Other Ambulatory Visit: Payer: Self-pay

## 2020-10-08 ENCOUNTER — Other Ambulatory Visit: Payer: Self-pay | Admitting: Gerontology

## 2020-10-08 ENCOUNTER — Other Ambulatory Visit: Payer: Self-pay

## 2020-10-08 DIAGNOSIS — I10 Essential (primary) hypertension: Secondary | ICD-10-CM

## 2020-10-10 MED FILL — Carvedilol Tab 12.5 MG: ORAL | 30 days supply | Qty: 60 | Fill #0 | Status: AC

## 2020-10-11 ENCOUNTER — Other Ambulatory Visit: Payer: Self-pay

## 2020-10-12 ENCOUNTER — Other Ambulatory Visit: Payer: Self-pay

## 2020-10-12 ENCOUNTER — Encounter: Payer: Self-pay | Admitting: Podiatry

## 2020-10-12 ENCOUNTER — Ambulatory Visit: Payer: No Typology Code available for payment source | Admitting: Podiatry

## 2020-10-12 ENCOUNTER — Ambulatory Visit (INDEPENDENT_AMBULATORY_CARE_PROVIDER_SITE_OTHER): Payer: No Typology Code available for payment source | Admitting: Podiatry

## 2020-10-12 DIAGNOSIS — Q828 Other specified congenital malformations of skin: Secondary | ICD-10-CM

## 2020-10-12 NOTE — Progress Notes (Signed)
Subjective:  Patient ID: Bryan Herring, male    DOB: 1960/11/08,  MRN: [id number]  Chief Complaint  Patient presents with   Callouses    Patient presents for painful callus lesions bottom of bilat feet   he would like them trimmed today    60 y.o. male presents with the above complaint.  Patient presents with follow-up of bilateral hyperkeratotic lesions/porokeratosis to plantar TN joint.  Patient has severe flatfoot with compensated collapse of the midfoot.  Patient states the calluses came back and is hurting him.  He would like to get it approved.  He would like to get it debrided down.  He denies any other acute complaints.  Review of Systems: Negative except as noted in the HPI. Denies N/V/F/Ch.  Past Medical History:  Diagnosis Date   Deaf    left ear   Essential hypertension 03/01/2018   Substance abuse ([place])     Current Outpatient Medications:    carvedilol (COREG) 12.5 MG tablet, Take 1 tablet (12.5 mg total) by mouth 2 (two) times daily with a meal., Disp: 60 tablet, Rfl: 1   clotrimazole-betamethasone (LOTRISONE) cream, Apply 1 application topically once daily., Disp: 30 g, Rfl: 0   losartan (COZAAR) 25 MG tablet, Take 1 tablet (25 mg total) by mouth daily., Disp: 90 tablet, Rfl: 1   melatonin 5 MG TABS, Take 2 tablets (10 mg total) by mouth at bedtime., Disp: 60 tablet, Rfl: 1   nicotine (NICODERM CQ - DOSED IN MG/24 HOURS) 14 mg/24hr patch, Place 14 mg onto the skin daily., Disp: , Rfl:    Salicylic Acid 0.5 % LOTN, Apply to plantars warts twice a day, Disp: 120 mL, Rfl: 0  Social History   Tobacco Use  Smoking Status Former   Packs/day: 0.25   Types: Cigarettes   Quit date: 04/2020   Years since quitting: 0.5  Smokeless Tobacco Never  Tobacco Comments   Patient wearing nicotine patch    Allergies  Allergen Reactions   No Known Allergies    Objective:  There were no vitals filed for this visit. There is no height or weight on file to calculate  BMI. Constitutional Well developed. Well nourished.  Vascular Dorsalis pedis pulses palpable bilaterally. Posterior tibial pulses palpable bilaterally. Capillary refill normal to all digits.  No cyanosis or clubbing noted. Pedal hair growth normal.  Neurologic Normal speech. Oriented to person, place, and time. Epicritic sensation to light touch grossly present bilaterally.  Dermatologic Hyperkeratotic lesion with central nucleated core noted to bilateral plantar hindfoot at the TN joint.  Pain on palpation to the lesion.  Upon debridement no pinpoint bleeding noted  Orthopedic: Normal joint ROM without pain or crepitus bilaterally. No visible deformities. No bony tenderness.   Radiographs: None Assessment:   1. Porokeratosis     Plan:  Patient was evaluated and treated and all questions answered.  Bilateral plantar hindfoot porokeratosis -I explained the patient the etiology of porokeratosis and various treatment options was discussed.  Given the amount of pain that is having I believe he will benefit from debridement of the lesion.  Using chisel blade and handle the lesion was debrided down to healthy striated tissue.  No complication noted no pinpoint bleeding noted. -I discussed with him that shoe gear modification would help he has severe pes planovalgus foot structure leading to compensation and callus formation.  In the past Lotrisone cream has helped with his callus as well as athlete's foot.  I refilled his Lotrisone cream.  No follow-ups on file.

## 2020-10-13 ENCOUNTER — Encounter
Admission: RE | Admit: 2020-10-13 | Discharge: 2020-10-13 | Disposition: A | Discharge status: Home / Self Care | Payer: Self-pay | Source: Ambulatory Visit | Attending: Cardiology | Admitting: Cardiology

## 2020-10-13 DIAGNOSIS — I5020 Unspecified systolic (congestive) heart failure: Secondary | ICD-10-CM | POA: Insufficient documentation

## 2020-10-13 LAB — NM MYOCAR MULTI W/SPECT W/WALL MOTION / EF
Estimated workload: 1
Exercise duration (min): 0 min
Exercise duration (sec): 0 s
LV dias vol: 94 mL (ref 62–150)
LV sys vol: 35 mL
MPHR: 161 {beats}/min
Nuc Stress EF: 63 %
Peak HR: 111 {beats}/min
Percent HR: 68 %
Rest HR: 62 {beats}/min
Rest Nuclear Isotope Dose: 10.6 mCi
Stress Nuclear Isotope Dose: 29.4 mCi
TID: 1

## 2020-10-13 MED ORDER — TECHNETIUM TC 99M TETROFOSMIN IV KIT
10.0000 | PACK | Freq: Once | INTRAVENOUS | Status: AC | PRN
  Administered 2020-10-13: 10.55 via INTRAVENOUS

## 2020-10-13 MED ORDER — REGADENOSON 0.4 MG/5ML IV SOLN
0.4000 mg | Freq: Once | INTRAVENOUS | Status: AC
  Administered 2020-10-13: 0.4 mg via INTRAVENOUS

## 2020-10-13 MED ORDER — TECHNETIUM TC 99M TETROFOSMIN IV KIT
30.0000 | PACK | Freq: Once | INTRAVENOUS | Status: AC | PRN
  Administered 2020-10-13: 29.39 via INTRAVENOUS

## 2020-10-15 ENCOUNTER — Telehealth: Payer: Self-pay

## 2020-10-15 NOTE — Telephone Encounter (Signed)
Called patient and gave him the stress test results as documented below.Marland Kitchen

## 2020-10-15 NOTE — Telephone Encounter (Signed)
Patient calling back.   °

## 2020-10-15 NOTE — Telephone Encounter (Signed)
-----   Message from Kate Sable, MD sent at 10/13/2020  4:51 PM EDT ----- Normal stress test, no evidence for ischemia.  Continue medications as prescribed.

## 2020-10-15 NOTE — Telephone Encounter (Signed)
Called and left a message for patient to call back so I can give him his Myoview results as documented below.

## 2020-11-08 ENCOUNTER — Other Ambulatory Visit: Payer: Self-pay

## 2020-11-08 MED FILL — Carvedilol Tab 12.5 MG: ORAL | 30 days supply | Qty: 60 | Fill #1 | Status: AC

## 2020-11-10 ENCOUNTER — Ambulatory Visit (INDEPENDENT_AMBULATORY_CARE_PROVIDER_SITE_OTHER): Payer: Self-pay

## 2020-11-10 ENCOUNTER — Other Ambulatory Visit: Payer: Self-pay

## 2020-11-10 DIAGNOSIS — I5020 Unspecified systolic (congestive) heart failure: Secondary | ICD-10-CM

## 2020-11-10 LAB — ECHOCARDIOGRAM COMPLETE
AR max vel: 1.87 cm2
AV Area VTI: 1.53 cm2
AV Area mean vel: 1.7 cm2
AV Mean grad: 2 mmHg
AV Peak grad: 4 mmHg
Ao pk vel: 1.01 m/s
Area-P 1/2: 4.19 cm2
S' Lateral: 3.6 cm
Single Plane A4C EF: 53.3 %

## 2020-11-23 ENCOUNTER — Ambulatory Visit: Payer: Self-pay | Admitting: Gerontology

## 2020-11-29 ENCOUNTER — Ambulatory Visit: Payer: Self-pay | Admitting: Gastroenterology

## 2020-12-06 ENCOUNTER — Encounter: Payer: Self-pay | Admitting: Cardiology

## 2020-12-06 ENCOUNTER — Ambulatory Visit (INDEPENDENT_AMBULATORY_CARE_PROVIDER_SITE_OTHER): Payer: Self-pay | Admitting: Cardiology

## 2020-12-06 ENCOUNTER — Other Ambulatory Visit: Payer: Self-pay

## 2020-12-06 VITALS — BP 138/90 | HR 74 | Ht 71.0 in | Wt 148.0 lb

## 2020-12-06 DIAGNOSIS — I10 Essential (primary) hypertension: Secondary | ICD-10-CM

## 2020-12-06 DIAGNOSIS — I5020 Unspecified systolic (congestive) heart failure: Secondary | ICD-10-CM

## 2020-12-06 DIAGNOSIS — F17200 Nicotine dependence, unspecified, uncomplicated: Secondary | ICD-10-CM

## 2020-12-06 NOTE — Patient Instructions (Signed)
Medication Instructions:  Your physician recommends that you continue on your current medications as directed. Please refer to the Current Medication list given to you today.  *If you need a refill on your cardiac medications before your next appointment, please call your pharmacy*   Lab Work: None Ordered If you have labs (blood work) drawn today and your tests are completely normal, you will receive your results only by: [place] (if you have MyChart) OR A paper copy in the mail If you have any lab test that is abnormal or we need to change your treatment, we will call you to review the results.   Testing/Procedures: None Ordered   Follow-Up: At [facility], you and your health needs are our priority.  As part of our continuing mission to provide you with exceptional heart care, we have created designated Provider Care Teams.  These Care Teams include your primary Cardiologist (physician) and Advanced Practice Providers (APPs -  Physician Assistants and Nurse Practitioners) who all work together to provide you with the care you need, when you need it.  We recommend signing up for the patient portal called "MyChart".  Sign up information is provided on this After Visit Summary.  MyChart is used to connect with patients for Virtual Visits (Telemedicine).  Patients are able to view lab/test results, encounter notes, upcoming appointments, etc.  Non-urgent messages can be sent to your provider as well.   To learn more about what you can do with MyChart, go to [email or web address].    Your next appointment:   6 month(s)  The format for your next appointment:   In Person  Provider:   You may see Kate Sable, MD or one of the following Advanced Practice Providers on your designated Care Team:   Murray Hodgkins, NP Christell Faith, PA-C Cadence Kathlen Mody, [place]    Other Instructions

## 2020-12-06 NOTE — Progress Notes (Signed)
Cardiology Office Note:    Date:  12/06/2020   ID:  Bryan Herring, DOB April 10, 1960, MRN [id number]  PCP:  Langston Reusing, NP   [facility] Providers Cardiologist:  Kate Sable, MD     Referring MD: Langston Reusing, NP   Chief Complaint  Patient presents with   Other    2 month follow up -- Meds reviewed verbally with patient.     History of Present Illness:    Bryan Herring is a 60 y.o. male with a hx of left ear deafness, cardiomyopathy, hypertension, prior substance abuse (heroin, coccaine x 30+yrs), current smoker who presents for follow-up.  Patient being seen for cardiomyopathy.  Started on lisinopril after last visit.  Coreg was continued.  Echocardiogram also ordered to evaluate systolic function.  Lexiscan Myoview was ordered to evaluate presence of ischemia.  He denies chest pain, shortness of breath or edema.  Currently in a rehab program.  Has abstained from heroin and cocaine for years.  Occasionally still smokes but is working on quitting. . Prior notes Echo 01/2019 EF 40 to 45%, impaired relaxation, no LVH. Previously stopped chlorthalidone due to dehydration.  Past Medical History:  Diagnosis Date   Deaf    left ear   Essential hypertension 03/01/2018   Substance abuse ([place])     Past Surgical History:  Procedure Laterality Date   ACHILLES TENDON SURGERY      Current Medications: Current Meds  Medication Sig   carvedilol (COREG) 12.5 MG tablet Take 1 tablet (12.5 mg total) by mouth 2 (two) times daily with a meal.   clotrimazole-betamethasone (LOTRISONE) cream Apply 1 application topically once daily.   losartan (COZAAR) 25 MG tablet Take 1 tablet (25 mg total) by mouth daily.   melatonin 5 MG TABS Take 2 tablets (10 mg total) by mouth at bedtime.   nicotine (NICODERM CQ - DOSED IN MG/24 HOURS) 14 mg/24hr patch Place 14 mg onto the skin daily.   Salicylic Acid 0.5 % LOTN Apply to plantars warts twice a day      Allergies:   No known allergies   Social History   Socioeconomic History   Marital status: Single    Spouse name: Not on file   Number of children: Not on file   Years of education: Not on file   Highest education level: Not on file  Occupational History   Not on file  Tobacco Use   Smoking status: Former    Packs/day: 0.25    Types: Cigarettes    Quit date: 04/2020    Years since quitting: 0.6   Smokeless tobacco: Never   Tobacco comments:    Patient wearing nicotine patch  Vaping Use   Vaping Use: Never used  Substance and Sexual Activity   Alcohol use: Not Currently    Comment: last use April 14, 2020 (resident at [facility])   Drug use: Not Currently    Types: Marijuana, Heroin    Comment: last use April 14, 2020 (resident at [facility])   Sexual activity: Not Currently  Other Topics Concern   Not on file  Social History Narrative   Not on file   Social Determinants of Health   Financial Resource Strain: Not on file  Food Insecurity: Food Insecurity Present   Worried About Charity fundraiser in the Last Year: Sometimes true   Ran Out of Food in the Last Year: Sometimes true  Transportation Needs: Unmet Transportation Needs   Lack of Transportation (  Medical): Yes   Lack of Transportation (Non-Medical): Yes  Physical Activity: Not on file  Stress: Not on file  Social Connections: Not on file     Family History: The patient's family history includes Aneurysm in his mother; Hypertension in his brother and maternal aunt; Other in his father, maternal grandfather, maternal grandmother, paternal grandfather, and paternal grandmother.  ROS:   Please see the history of present illness.     All other systems reviewed and are negative.  EKGs/Labs/Other Studies Reviewed:    The following studies were reviewed today:   EKG:  EKG not  ordered today.  Recent Labs: 04/28/2020: ALT 30 07/21/2020: BUN 33; Creatinine, Ser 0.89; Hemoglobin 11.3; Platelets 207; Potassium 3.6;  Sodium 140  Recent Lipid Panel No results found for: CHOL, TRIG, HDL, CHOLHDL, VLDL, LDLCALC, LDLDIRECT   Risk Assessment/Calculations:          Physical Exam:    VS:  BP 138/90 (BP Location: Left Arm, Patient Position: Sitting, Cuff Size: Normal)   Pulse 74   Ht 5\' 11"  (1.803 m)   Wt 148 lb (67.1 kg)   SpO2 98%   BMI 20.64 kg/m     Wt Readings from Last 3 Encounters:  12/06/20 148 lb (67.1 kg)  10/04/20 134 lb (60.8 kg)  09/22/20 134 lb 9.6 oz (61.1 kg)     GEN:  Well nourished, well developed in no acute distress HEENT: Normal NECK: No JVD; No carotid bruits LYMPHATICS: No lymphadenopathy CARDIAC: RRR, no murmurs, rubs, gallops RESPIRATORY:  Clear to auscultation without rales, wheezing or rhonchi  ABDOMEN: Soft, non-tender, non-distended MUSCULOSKELETAL:  No edema; No deformity  SKIN: Warm and dry NEUROLOGIC:  Alert and oriented x 3 PSYCHIATRIC:  Normal affect   ASSESSMENT:    1. HFrEF (heart failure with reduced ejection fraction) ([place])   2. Primary hypertension   3. Smoking     PLAN:    In order of problems listed above:  Cardiomyopathy, prior echo 01/2019 with EF 40 to 45%.  Likely nonischemic secondary to prior polysubstance use/cocaine/heroin use.  Repeat echocardiogram 11/2020 showed improvement in EF 50 to 55%.,  Lexiscan Myoview with no evidence for ischemia. Continue Coreg 12.5 mg twice daily, losartan 25mg  qd. Hypertension, continue Coreg, losartan 25mg  qd. Current smoker, cessation advised.  Follow-up in 6 months.    Medication Adjustments/Labs and Tests Ordered: Current medicines are reviewed at length with the patient today.  Concerns regarding medicines are outlined above.  No orders of the defined types were placed in this encounter.   No orders of the defined types were placed in this encounter.    Patient Instructions  Medication Instructions:  Your physician recommends that you continue on your current medications as directed.  Please refer to the Current Medication list given to you today.  *If you need a refill on your cardiac medications before your next appointment, please call your pharmacy*   Lab Work: None Ordered If you have labs (blood work) drawn today and your tests are completely normal, you will receive your results only by: [place] (if you have MyChart) OR A paper copy in the mail If you have any lab test that is abnormal or we need to change your treatment, we will call you to review the results.   Testing/Procedures: None Ordered   Follow-Up: At [facility], you and your health needs are our priority.  As part of our continuing mission to provide you with exceptional heart care, we have created designated  Provider Care Teams.  These Care Teams include your primary Cardiologist (physician) and Advanced Practice Providers (APPs -  Physician Assistants and Nurse Practitioners) who all work together to provide you with the care you need, when you need it.  We recommend signing up for the patient portal called "MyChart".  Sign up information is provided on this After Visit Summary.  MyChart is used to connect with patients for Virtual Visits (Telemedicine).  Patients are able to view lab/test results, encounter notes, upcoming appointments, etc.  Non-urgent messages can be sent to your provider as well.   To learn more about what you can do with MyChart, go to [email or web address].    Your next appointment:   6 month(s)  The format for your next appointment:   In Person  Provider:   You may see Kate Sable, MD or one of the following Advanced Practice Providers on your designated Care Team:   Murray Hodgkins, NP Christell Faith, PA-C Cadence Kathlen Mody, [place]    Other Instructions    Signed, Kate Sable, MD  12/06/2020 4:35 PM    [place]

## 2020-12-13 ENCOUNTER — Ambulatory Visit (INDEPENDENT_AMBULATORY_CARE_PROVIDER_SITE_OTHER): Payer: Self-pay | Admitting: Gastroenterology

## 2020-12-13 ENCOUNTER — Other Ambulatory Visit: Payer: Self-pay

## 2020-12-13 ENCOUNTER — Encounter: Payer: Self-pay | Admitting: Gastroenterology

## 2020-12-13 ENCOUNTER — Other Ambulatory Visit: Payer: Self-pay | Admitting: Gerontology

## 2020-12-13 VITALS — BP 168/111 | HR 72 | Temp 98.3°F | Ht 71.0 in | Wt 155.4 lb

## 2020-12-13 DIAGNOSIS — I10 Essential (primary) hypertension: Secondary | ICD-10-CM

## 2020-12-13 DIAGNOSIS — Z1211 Encounter for screening for malignant neoplasm of colon: Secondary | ICD-10-CM

## 2020-12-13 MED ORDER — PEG 3350-KCL-NABCB-NACL-NASULF 236 G PO SOLR
ORAL | 0 refills | Status: DC
  Filled 2020-12-13: qty 4000, 1d supply, fill #0

## 2020-12-14 ENCOUNTER — Other Ambulatory Visit: Payer: Self-pay

## 2020-12-14 MED ORDER — CARVEDILOL 12.5 MG PO TABS
12.5000 mg | ORAL_TABLET | Freq: Two times a day (BID) | ORAL | 0 refills | Status: DC
  Filled 2020-12-14: qty 60, 30d supply, fill #0

## 2020-12-14 NOTE — Progress Notes (Signed)
No office visit required.

## 2020-12-16 ENCOUNTER — Ambulatory Visit: Payer: Self-pay | Admitting: Gerontology

## 2020-12-16 ENCOUNTER — Other Ambulatory Visit: Payer: Self-pay

## 2020-12-16 ENCOUNTER — Encounter: Payer: Self-pay | Admitting: Gerontology

## 2020-12-16 VITALS — BP 146/88 | HR 81 | Temp 98.1°F | Resp 16 | Ht 71.0 in | Wt 151.8 lb

## 2020-12-16 DIAGNOSIS — Z0000 Encounter for general adult medical examination without abnormal findings: Secondary | ICD-10-CM

## 2020-12-16 DIAGNOSIS — I10 Essential (primary) hypertension: Secondary | ICD-10-CM

## 2020-12-16 DIAGNOSIS — M20001 Unspecified deformity of right finger(s): Secondary | ICD-10-CM | POA: Insufficient documentation

## 2020-12-16 DIAGNOSIS — M25572 Pain in left ankle and joints of left foot: Secondary | ICD-10-CM | POA: Insufficient documentation

## 2020-12-16 DIAGNOSIS — G8929 Other chronic pain: Secondary | ICD-10-CM

## 2020-12-16 MED ORDER — CARVEDILOL 12.5 MG PO TABS
12.5000 mg | ORAL_TABLET | Freq: Two times a day (BID) | ORAL | 3 refills | Status: DC
Start: 2020-12-16 — End: 2021-02-03
  Filled 2020-12-16: qty 60, 30d supply, fill #0
  Filled 2021-01-10: qty 180, 90d supply, fill #0

## 2020-12-16 MED ORDER — MELOXICAM 7.5 MG PO TABS
7.5000 mg | ORAL_TABLET | Freq: Every day | ORAL | 0 refills | Status: DC
  Filled 2020-12-16: qty 14, 14d supply, fill #0

## 2020-12-16 NOTE — Progress Notes (Signed)
Established Patient Office Visit  Subjective:  Patient ID: Bryan Herring, male    DOB: March 29, 1960  Age: 60 y.o. 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 He states that he's compliant with his medications and continues to make healthy lifestyle changes. He was seen by the Cardiology Dr Garen Lah on 12/06/20, echocardiogram done on 11/22 showed improvement in his Ejection fraction of 50 to 55%, Lexiscan Myoview with no evidence of ischemia, he will continue on Coreg 12.5 mg bid and Losartan 25 mg daily. Currently, he c/o constant  non radiating 8/10 pain to left ankle pain that has being going on for more than 6 months, he states that pain is dull, and walking aggravates symptoms. He states that using heating pad minimally relieves symptoms. He scheduled an appointment with Podiatrist Dr Posey Pronto on 12/23/20. He also c/o disfigured right 5th finger that happened during his childhood playing basket ball. He states that he injured it while playing basket last week and has being experiencing some discomfort. Overall, he states that he's doing well and offers no further complaint.    Past Medical History:  Diagnosis Date   Deaf    left ear   Essential hypertension 03/01/2018   Substance abuse ([place])     Past Surgical History:  Procedure Laterality Date   ACHILLES TENDON SURGERY      Family History  Problem Relation Age of Onset   Aneurysm Mother    Other Father        unknown medical history   Hypertension Brother    Other Maternal Grandmother        unknown medical history   Other Maternal Grandfather        unknown medical history   Other Paternal Grandmother        unknown medical history   Other Paternal Grandfather        unknown medical history   Hypertension  Maternal Aunt     Social History   Socioeconomic History   Marital status: Single    Spouse name: Not on file   Number of children: Not on file   Years of education: Not on file   Highest education level: Not on file  Occupational History   Not on file  Tobacco Use   Smoking status: Former    Packs/day: 0.25    Types: Cigarettes    Quit date: 04/2020    Years since quitting: 0.6   Smokeless tobacco: Never   Tobacco comments:    Patient wearing nicotine patch  Vaping Use   Vaping Use: Never used  Substance and Sexual Activity   Alcohol use: Not Currently    Comment: last use April 14, 2020 (resident at [facility])   Drug use: Not Currently    Types: Marijuana, Heroin    Comment: last use April 14, 2020 (resident at [place])   Sexual activity: Not Currently  Other Topics Concern   Not on file  Social History Narrative   Not on file   Social Determinants of Health   Financial Resource Strain: Not on file  Food Insecurity: Food Insecurity Present   Worried About [place] in the Last Year: Sometimes true   Ran Out of Food in the Last Year: Sometimes true  Transportation Needs: Unmet  Data processing manager (Medical): Yes   Lack of Transportation (Non-Medical): Yes  Physical Activity: Not on file  Stress: Not on file  Social Connections: Not on file  Intimate Partner Violence: Not on file    Outpatient Medications Prior to Visit  Medication Sig Dispense Refill   clotrimazole-betamethasone (LOTRISONE) cream Apply 1 application topically once daily. 30 g 0   losartan (COZAAR) 25 MG tablet Take 1 tablet (25 mg total) by mouth daily. 90 tablet 1   melatonin 5 MG TABS Take 2 tablets (10 mg total) by mouth at bedtime. 60 tablet 1   nicotine (NICODERM CQ - DOSED IN MG/24 HOURS) 14 mg/24hr patch Place 14 mg onto the skin daily.     Salicylic Acid 0.5 % LOTN Apply to plantars warts twice a day 120 mL 0   carvedilol (COREG) 12.5 MG tablet Take 1 tablet  (12.5 mg total) by mouth 2 (two) times daily with a meal. 60 tablet 0   polyethylene glycol (GOLYTELY) 236 g solution Prepare according to package instructions. Starting at 5:00PM: Drink one 8 oz glass of mixture every 15 minutes until you finish half of the jug. 5 hours prior to procedure, drink one 8 oz glass of mixture every 15 minutes until it is all gone. Make sure you do not drink anything 4 hours prior to your procedure. (Patient not taking: Reported on 12/16/2020) 4000 mL 0   No facility-administered medications prior to visit.    Allergies  Allergen Reactions   No Known Allergies     ROS Review of Systems  Constitutional: Negative.   Eyes: Negative.   Respiratory: Negative.    Cardiovascular: Negative.   Musculoskeletal:  Positive for arthralgias (left ankle pain).       Disfigured right 5th finger     Objective:    Physical Exam HENT:     Head: Normocephalic and atraumatic.     Mouth/Throat:     Mouth: Mucous membranes are moist.  Eyes:     Extraocular Movements: Extraocular movements intact.     Conjunctiva/sclera: Conjunctivae normal.     Pupils: Pupils are equal, round, and reactive to light.  Cardiovascular:     Rate and Rhythm: Normal rate and regular rhythm.     Pulses: Normal pulses.     Heart sounds: Normal heart sounds.  Pulmonary:     Effort: Pulmonary effort is normal.     Breath sounds: Normal breath sounds.  Musculoskeletal:        General: Tenderness (with palpation to lateral malleolus of left ankle) and deformity (right 5th finger) present.  Skin:    General: Skin is warm.  Neurological:     General: No focal deficit present.     Mental Status: He is alert and oriented to person, place, and time. Mental status is at baseline.  Psychiatric:        Mood and Affect: Mood normal.        Behavior: Behavior normal.        Thought Content: Thought content normal.        Judgment: Judgment normal.    BP (!) 146/88 (BP Location: Right Arm, Patient  Position: Sitting, Cuff Size: Large)   Pulse 81   Temp 98.1 F (36.7 C) (Oral)   Resp 16   Ht '5\' 11"'  (1.803 m)   Wt 151 lb 12.8 oz (68.9 kg)   SpO2 96%   BMI 21.17 kg/m  Wt Readings from Last 3 Encounters:  12/16/20 151 lb 12.8 oz (68.9 kg)  12/13/20 155 lb 6.4 oz (70.5 kg)  12/06/20 148 lb (67.1 kg)     Health Maintenance Due  Topic Date Due   COVID-19 Vaccine (1) Never done   Pneumococcal Vaccine 34-60 Years old (1 - PCV) Never done   Hepatitis C Screening  Never done   TETANUS/TDAP  Never done   Zoster Vaccines- Shingrix (1 of 2) Never done   COLONOSCOPY (Pts 45-47yr Insurance coverage will need to be confirmed)  Never done    There are no preventive care reminders to display for this patient.  Lab Results  Component Value Date   TSH 1.589 02/03/2019   Lab Results  Component Value Date   WBC 7.9 07/21/2020   HGB 11.3 (L) 07/21/2020   HCT 33.3 (L) 07/21/2020   MCV 91.5 07/21/2020   PLT 207 07/21/2020   Lab Results  Component Value Date   NA 140 07/21/2020   K 3.6 07/21/2020   CO2 29 07/21/2020   GLUCOSE 115 (H) 07/21/2020   BUN 33 (H) 07/21/2020   CREATININE 0.89 07/21/2020   BILITOT 0.3 04/28/2020   ALKPHOS 73 04/28/2020   AST 15 04/28/2020   ALT 30 04/28/2020   PROT 7.4 04/28/2020   ALBUMIN 4.3 04/28/2020   CALCIUM 10.0 07/21/2020   ANIONGAP 10 07/21/2020   EGFR 95 04/28/2020   No results found for: CHOL No results found for: HDL No results found for: LDLCALC No results found for: TRIG No results found for: CHOLHDL Lab Results  Component Value Date   HGBA1C 5.7 (H) 02/04/2019      Assessment & Plan:   1. Essential hypertension -His blood pressure is not under control and his goal should be less than 140/90.  He will continue current medication, DASH diet. - carvedilol (COREG) 12.5 MG tablet; Take 1 tablet (12.5 mg total) by mouth 2 (two) times daily with a meal.  Dispense: 60 tablet; Refill: 3  2. Chronic pain of left ankle -Unknown  etiology of pain, he was started on meloxicam 7.5 mg daily, was educated on medication side effects and was advised to notify clinic.  He was advised to follow-up with podiatrist on 12/23/2020. - meloxicam (MOBIC) 7.5 MG tablet; Take 1 tablet (7.5 mg total) by mouth once daily.  Dispense: 14 tablet; Refill: 0  3. Finger deformity, right -He will follow-up with orthopedic for right fifth finger deformity. - Ambulatory referral to Orthopedic Surgery  4. Health care maintenance  - Flu Vaccine QUAD 6+ mos PF IM (Fluarix Quad PF) was administered-we will recheck lipid panel; Future     Follow-up: Return in about 7 weeks (around 02/03/2021), or if symptoms worsen or fail to improve.    Diksha Tagliaferro EJerold Coombe NP

## 2020-12-16 NOTE — Patient Instructions (Signed)

## 2020-12-17 ENCOUNTER — Other Ambulatory Visit: Payer: Self-pay

## 2020-12-23 ENCOUNTER — Other Ambulatory Visit: Payer: Self-pay

## 2020-12-23 ENCOUNTER — Ambulatory Visit (INDEPENDENT_AMBULATORY_CARE_PROVIDER_SITE_OTHER): Payer: No Typology Code available for payment source | Admitting: Podiatry

## 2020-12-23 DIAGNOSIS — Q828 Other specified congenital malformations of skin: Secondary | ICD-10-CM

## 2020-12-23 NOTE — Progress Notes (Signed)
Subjective:  Patient ID: Socorro Ebron, male    DOB: 03/28/1960,  MRN: [id number]  Chief Complaint  Patient presents with   Callouses    Bilateral     60 y.o. male presents with the above complaint.  Patient presents with follow-up of bilateral hyperkeratotic lesions/porokeratosis to plantar TN joint.  Patient has severe flatfoot with compensated collapse of the midfoot.  Patient states the calluses came back and is hurting him.  He would like to get it approved.  He would like to get it debrided down.  He denies any other acute complaints.  Review of Systems: Negative except as noted in the HPI. Denies N/V/F/Ch.  Past Medical History:  Diagnosis Date   Deaf    left ear   Essential hypertension 03/01/2018   Substance abuse ([place])     Current Outpatient Medications:    carvedilol (COREG) 12.5 MG tablet, Take 1 tablet (12.5 mg total) by mouth 2 (two) times daily with a meal., Disp: 60 tablet, Rfl: 3   clotrimazole-betamethasone (LOTRISONE) cream, Apply 1 application topically once daily., Disp: 30 g, Rfl: 0   losartan (COZAAR) 25 MG tablet, Take 1 tablet (25 mg total) by mouth daily., Disp: 90 tablet, Rfl: 1   melatonin 5 MG TABS, Take 2 tablets (10 mg total) by mouth at bedtime., Disp: 60 tablet, Rfl: 1   meloxicam (MOBIC) 7.5 MG tablet, Take 1 tablet (7.5 mg total) by mouth once daily., Disp: 14 tablet, Rfl: 0   nicotine (NICODERM CQ - DOSED IN MG/24 HOURS) 14 mg/24hr patch, Place 14 mg onto the skin daily., Disp: , Rfl:    polyethylene glycol (GOLYTELY) 236 g solution, Prepare according to package instructions. Starting at 5:00PM: Drink one 8 oz glass of mixture every 15 minutes until you finish half of the jug. 5 hours prior to procedure, drink one 8 oz glass of mixture every 15 minutes until it is all gone. Make sure you do not drink anything 4 hours prior to your procedure. (Patient not taking: Reported on 12/16/2020), Disp: 0093 mL, Rfl: 0   Salicylic Acid 0.5 % LOTN, Apply  to plantars warts twice a day, Disp: 120 mL, Rfl: 0  Social History   Tobacco Use  Smoking Status Former   Packs/day: 0.25   Types: Cigarettes   Quit date: 04/2020   Years since quitting: 0.7  Smokeless Tobacco Never  Tobacco Comments   Patient wearing nicotine patch    Allergies  Allergen Reactions   No Known Allergies    Objective:  There were no vitals filed for this visit. There is no height or weight on file to calculate BMI. Constitutional Well developed. Well nourished.  Vascular Dorsalis pedis pulses palpable bilaterally. Posterior tibial pulses palpable bilaterally. Capillary refill normal to all digits.  No cyanosis or clubbing noted. Pedal hair growth normal.  Neurologic Normal speech. Oriented to person, place, and time. Epicritic sensation to light touch grossly present bilaterally.  Dermatologic Hyperkeratotic lesion with central nucleated core noted to bilateral plantar hindfoot at the TN joint.  Pain on palpation to the lesion.  Upon debridement no pinpoint bleeding noted  Orthopedic: Normal joint ROM without pain or crepitus bilaterally. No visible deformities. No bony tenderness.   Radiographs: None Assessment:   1. Porokeratosis      Plan:  Patient was evaluated and treated and all questions answered.  Bilateral plantar hindfoot porokeratosis -I explained the patient the etiology of porokeratosis and various treatment options was discussed.  Given the amount  of pain that is having I believe he will benefit from debridement of the lesion.  Using chisel blade and handle the lesion was debrided down to healthy striated tissue.  No complication noted no pinpoint bleeding noted. -I discussed with him that shoe gear modification would help he has severe pes planovalgus foot structure leading to compensation and callus formation.  In the past Lotrisone cream has helped with his callus as well as athlete's foot.  I refilled his Lotrisone cream.  No  follow-ups on file.

## 2021-01-11 ENCOUNTER — Encounter: Payer: Self-pay | Admitting: Orthopedic Surgery

## 2021-01-11 ENCOUNTER — Ambulatory Visit (INDEPENDENT_AMBULATORY_CARE_PROVIDER_SITE_OTHER): Payer: Self-pay | Admitting: Orthopedic Surgery

## 2021-01-11 ENCOUNTER — Other Ambulatory Visit: Payer: Self-pay

## 2021-01-11 ENCOUNTER — Ambulatory Visit (INDEPENDENT_AMBULATORY_CARE_PROVIDER_SITE_OTHER): Payer: Self-pay

## 2021-01-11 VITALS — BP 137/90 | HR 79 | Ht 71.0 in | Wt 153.4 lb

## 2021-01-11 DIAGNOSIS — M20001 Unspecified deformity of right finger(s): Secondary | ICD-10-CM

## 2021-01-11 DIAGNOSIS — M79644 Pain in right finger(s): Secondary | ICD-10-CM

## 2021-01-11 NOTE — Progress Notes (Signed)
Office Visit Note   Patient: Bryan Herring           Date of Birth: 1960-08-11           MRN: [id number] Visit Date: 01/11/2021              Requested by: Langston Reusing, NP [place],  [place] [address] PCP: Langston Reusing, NP   Assessment & Plan: Visit Diagnoses:  1. Finger pain, right   2. Finger deformity, right     Plan: Discussed with patient that given the deformity of the finger and the chronicity of the injury, will be extremely difficult to correct the deformity well-maintained acceptable finger function.  We discussed amputation of the small finger through the PIP joint which would improve the cosmetic appearance of the finger and avoid getting caught on his clothing or other objects.  Risks of the surgery were discussed including bleeding, infection, delayed wound healing, incomplete symptom relief, and need for additional surgery.  He likes this idea and wants to discuss it further with his family.  He was given my contact information and he will call me in the near future if he would like to proceed.  Follow-Up Instructions: No follow-ups on file.   Orders:  Orders Placed This Encounter  Procedures   XR Finger Little Right   No orders of the defined types were placed in this encounter.     Procedures: No procedures performed   Clinical Data: No additional findings.   Subjective: Chief Complaint  Patient presents with   Right Hand - New Patient (Initial Visit)    This is a 61 year old right-hand-dominant male who presents with a deformity of the right small finger that occurred as a child.  He notes that he jammed his finger approximately 50 years ago.  The finger is occasionally painful as he scrapes the volar aspect of the PIP joint and the dorsal aspect of the DIP joint when lifting certain objects.  He also does not like the cosmetic appearance of the finger.  He has no active range of motion of the finger.  He is  currently living in a treatment facility for drug abuse but has been clean since approximately April.   Review of Systems   Objective: Vital Signs: BP 137/90 (BP Location: Right Arm, Patient Position: Sitting, Cuff Size: Large)    Pulse 79    Ht 5\' 11"  (1.803 m)    Wt 153 lb 6.4 oz (69.6 kg)    SpO2 97%    BMI 21.39 kg/m   Physical Exam Constitutional:      Appearance: Normal appearance.  Cardiovascular:     Rate and Rhythm: Normal rate.     Pulses: Normal pulses.  Pulmonary:     Effort: Pulmonary effort is normal.  Skin:    General: Skin is warm and dry.     Capillary Refill: Capillary refill takes less than 2 seconds.  Neurological:     Mental Status: He is alert.    Left Hand Exam   Tenderness  The patient is experiencing no tenderness.   Other  Erythema: absent Sensation: normal Pulse: present  Comments:  Deformity of the small finger with 65 deg  hyper-extension but can be passively corrected to near 0 degrees.  Fixed 80 deg flexion deformity at DIP joint.  Skin is warm, dry, and supple.       Specialty Comments:  No specialty comments available.  Imaging: 3  views of the right small finger taken today reviewed interpreted by me.  They demonstrate remodeling of the head of the proximal phalanx and the middle phalanx base secondary to prolonged hyperextension deformity.  The DIP joint is held in a flexed posture.   PMFS History: Patient Active Problem List   Diagnosis Date Noted   Left ankle pain 12/16/2020   Finger deformity, right 12/16/2020   Health care maintenance 09/22/2020   Blurry vision 09/22/2020   Bilateral edema of lower extremity 08/12/2020   Hypotension 07/22/2020   Acute left-sided low back pain 07/22/2020   Callus of foot 07/22/2020   Plantar warts 04/28/2020   AKI (acute kidney injury) ([place]) 02/04/2019   Leukocytosis 02/04/2019   LVH (left ventricular hypertrophy) 03/01/2018   Essential hypertension 03/01/2018   Past Medical History:   Diagnosis Date   Deaf    left ear   Essential hypertension 03/01/2018   Substance abuse ([place])     Family History  Problem Relation Age of Onset   Aneurysm Mother    Other Father        unknown medical history   Hypertension Brother    Other Maternal Grandmother        unknown medical history   Other Maternal Grandfather        unknown medical history   Other Paternal Grandmother        unknown medical history   Other Paternal Grandfather        unknown medical history   Hypertension Maternal Aunt     Past Surgical History:  Procedure Laterality Date   ACHILLES TENDON SURGERY     Social History   Occupational History   Not on file  Tobacco Use   Smoking status: Former    Packs/day: 0.25    Types: Cigarettes    Quit date: 04/2020    Years since quitting: 0.7   Smokeless tobacco: Never   Tobacco comments:    Patient wearing nicotine patch  Vaping Use   Vaping Use: Never used  Substance and Sexual Activity   Alcohol use: Not Currently    Comment: last use April 14, 2020 (resident at [facility])   Drug use: Not Currently    Types: Marijuana, Heroin    Comment: last use April 14, 2020 (resident at [facility])   Sexual activity: Not Currently

## 2021-01-20 ENCOUNTER — Encounter: Payer: Self-pay | Admitting: Gastroenterology

## 2021-01-20 ENCOUNTER — Encounter
Admission: RE | Disposition: A | Discharge status: Home / Self Care | Payer: Self-pay | Source: Home / Self Care | Attending: Gastroenterology

## 2021-01-20 ENCOUNTER — Ambulatory Visit
Admission: RE | Admit: 2021-01-20 | Discharge: 2021-01-20 | Disposition: A | Discharge status: Home / Self Care | Payer: Self-pay | Attending: Gastroenterology | Admitting: Gastroenterology

## 2021-01-20 ENCOUNTER — Ambulatory Visit: Payer: Self-pay | Admitting: Anesthesiology

## 2021-01-20 DIAGNOSIS — I10 Essential (primary) hypertension: Secondary | ICD-10-CM | POA: Insufficient documentation

## 2021-01-20 DIAGNOSIS — Z1211 Encounter for screening for malignant neoplasm of colon: Secondary | ICD-10-CM | POA: Insufficient documentation

## 2021-01-20 DIAGNOSIS — D126 Benign neoplasm of colon, unspecified: Secondary | ICD-10-CM

## 2021-01-20 DIAGNOSIS — Z87891 Personal history of nicotine dependence: Secondary | ICD-10-CM | POA: Insufficient documentation

## 2021-01-20 DIAGNOSIS — D128 Benign neoplasm of rectum: Secondary | ICD-10-CM | POA: Insufficient documentation

## 2021-01-20 HISTORY — PX: COLONOSCOPY WITH PROPOFOL: SHX5780

## 2021-01-20 LAB — URINE DRUG SCREEN, QUALITATIVE (ARMC ONLY)
Amphetamines, Ur Screen: NOT DETECTED
Barbiturates, Ur Screen: NOT DETECTED
Benzodiazepine, Ur Scrn: NOT DETECTED
Cannabinoid 50 Ng, Ur ~~LOC~~: NOT DETECTED
Cocaine Metabolite,Ur ~~LOC~~: NOT DETECTED
MDMA (Ecstasy)Ur Screen: NOT DETECTED
Methadone Scn, Ur: NOT DETECTED
Opiate, Ur Screen: NOT DETECTED
Phencyclidine (PCP) Ur S: NOT DETECTED
Tricyclic, Ur Screen: NOT DETECTED

## 2021-01-20 SURGERY — COLONOSCOPY WITH PROPOFOL
Anesthesia: General

## 2021-01-20 MED ORDER — SODIUM CHLORIDE 0.9 % IV SOLN
INTRAVENOUS | Status: DC
  Administered 2021-01-20 (×2): 20 mL/h via INTRAVENOUS

## 2021-01-20 MED ORDER — PROPOFOL 500 MG/50ML IV EMUL
INTRAVENOUS | Status: DC | PRN
  Administered 2021-01-20: 150 ug/kg/min via INTRAVENOUS

## 2021-01-20 MED ORDER — PROPOFOL 500 MG/50ML IV EMUL
INTRAVENOUS | Status: AC
  Filled 2021-01-20: qty 50

## 2021-01-20 NOTE — H&P (Signed)
Jonathon Bellows, MD [address], [place], [place], [place], [address] [address], [place], [place], [place], [address] Phone: [phone]  Fax: [phone]  Primary Care Physician:  Langston Reusing, NP   Pre-Procedure History & Physical: HPI:  Bryan Herring is a 61 y.o. male is here for an colonoscopy.   Past Medical History:  Diagnosis Date   Deaf    left ear   Essential hypertension 03/01/2018   Substance abuse ([place])     Past Surgical History:  Procedure Laterality Date   ACHILLES TENDON SURGERY      Prior to Admission medications   Medication Sig Start Date End Date Taking? Authorizing Provider  carvedilol (COREG) 12.5 MG tablet Take 1 tablet (12.5 mg total) by mouth 2 (two) times daily with a meal. 12/16/20  Yes Iloabachie, Chioma E, NP  clotrimazole-betamethasone (LOTRISONE) cream Apply 1 application topically once daily. 09/02/20  Yes Felipa Furnace, DPM  losartan (COZAAR) 25 MG tablet Take 1 tablet (25 mg total) by mouth daily. 10/04/20 04/12/21 Yes Agbor-Etang, Aaron Edelman, MD  melatonin 5 MG TABS Take 2 tablets (10 mg total) by mouth at bedtime. 04/28/20  Yes Elsie Stain, MD  meloxicam (MOBIC) 7.5 MG tablet Take 1 tablet (7.5 mg total) by mouth once daily. 12/16/20  Yes Iloabachie, Chioma E, NP  nicotine (NICODERM CQ - DOSED IN MG/24 HOURS) 14 mg/24hr patch Place 14 mg onto the skin daily.   Yes [provider]  Salicylic Acid 0.5 % LOTN Apply to plantars warts twice a day 08/12/20  Yes Iloabachie, Chioma E, NP  polyethylene glycol (GOLYTELY) 236 g solution Prepare according to package instructions. Starting at 5:00PM: Drink one 8 oz glass of mixture every 15 minutes until you finish half of the jug. 5 hours prior to procedure, drink one 8 oz glass of mixture every 15 minutes until it is all gone. Make sure you do not drink anything 4 hours prior to your procedure. 12/13/20   Jonathon Bellows, MD    Allergies as of 12/14/2020 - Review Complete  12/13/2020  Allergen Reaction Noted   No known allergies  08/12/2020    Family History  Problem Relation Age of Onset   Aneurysm Mother    Other Father        unknown medical history   Hypertension Brother    Other Maternal Grandmother        unknown medical history   Other Maternal Grandfather        unknown medical history   Other Paternal Grandmother        unknown medical history   Other Paternal Grandfather        unknown medical history   Hypertension Maternal Aunt     Social History   Socioeconomic History   Marital status: Single    Spouse name: Not on file   Number of children: Not on file   Years of education: Not on file   Highest education level: Not on file  Occupational History   Not on file  Tobacco Use   Smoking status: Former    Packs/day: 0.25    Types: Cigarettes    Quit date: 04/2020    Years since quitting: 0.7   Smokeless tobacco: Never   Tobacco comments:    Patient wearing nicotine patch  Vaping Use   Vaping Use: Never used  Substance and Sexual Activity   Alcohol use: Not Currently    Comment: last use April 14, 2020 (resident at [facility])   Drug use: Not Currently    Types: Marijuana, Heroin    Comment: last use April 14, 2020 (resident at [facility])   Sexual activity: Not Currently  Other Topics Concern   Not on file  Social History Narrative   Not on file   Social Determinants of Health   Financial Resource Strain: Not on file  Food Insecurity: Food Insecurity Present   Worried About Charity fundraiser in the Last Year: Sometimes true   Arboriculturist in the Last Year: Sometimes true  Transportation Needs: Public librarian (Medical): Yes   Lack of Transportation (Non-Medical): Yes  Physical Activity: Not on file  Stress: Not on file  Social Connections: Not on file  Intimate Partner Violence: Not on file    Review of Systems: See HPI, otherwise negative ROS  Physical Exam: BP (!) 143/111     Pulse 87    Temp (!) 97.5 F (36.4 C) (Temporal)    Resp 20    Ht 5\' 11"  (1.803 m)    Wt 69.4 kg    SpO2 99%    BMI 21.34 kg/m  General:   Alert,  pleasant and cooperative in NAD Head:  Normocephalic and atraumatic. Neck:  Supple; no masses or thyromegaly. Lungs:  Clear throughout to auscultation, normal respiratory effort.    Heart:  +S1, +S2, Regular rate and rhythm, No edema. Abdomen:  Soft, nontender and nondistended. Normal bowel sounds, without guarding, and without rebound.   Neurologic:  Alert and  oriented x4;  grossly normal neurologically.  Impression/Plan: Bryan Herring is here for an colonoscopy to be performed for Screening colonoscopy average risk   Risks, benefits, limitations, and alternatives regarding  colonoscopy have been reviewed with the patient.  Questions have been answered.  All parties agreeable.   Jonathon Bellows, MD  01/20/2021, 10:59 AM

## 2021-01-20 NOTE — Anesthesia Procedure Notes (Signed)
Date/Time: 01/20/2021 11:07 AM Performed by: Vaughan Sine Pre-anesthesia Checklist: Patient identified, Emergency Drugs available, Suction available, Patient being monitored and Timeout performed Patient Re-evaluated:Patient Re-evaluated prior to induction Oxygen Delivery Method: Nasal cannula Preoxygenation: Pre-oxygenation with 100% oxygen Induction Type: IV induction Placement Confirmation: positive ETCO2 and CO2 detector

## 2021-01-20 NOTE — Transfer of Care (Signed)
Immediate Anesthesia Transfer of Care Note  Patient: Bryan Herring  Procedure(s) Performed: COLONOSCOPY WITH PROPOFOL  Patient Location: PACU  Anesthesia Type:General  Level of Consciousness: awake and sedated  Airway & Oxygen Therapy: Patient Spontanous Breathing and Patient connected to nasal cannula oxygen  Post-op Assessment: Report given to RN and Post -op Vital signs reviewed and stable  Post vital signs: Reviewed and stable  Last Vitals:  Vitals Value Taken Time  BP    Temp    Pulse    Resp    SpO2      Last Pain:  Vitals:   01/20/21 0817  TempSrc: Temporal  PainSc: 0-No pain         Complications: No notable events documented.

## 2021-01-20 NOTE — Anesthesia Preprocedure Evaluation (Signed)
Anesthesia Evaluation  Patient identified by MRN, date of birth, ID band Patient awake    Reviewed: Allergy & Precautions, H&P , NPO status , Patient's Chart, lab work & pertinent test results, reviewed documented beta blocker date and time   History of Anesthesia Complications Negative for: history of anesthetic complications  Airway Mallampati: II  TM Distance: >3 FB Neck ROM: full    Dental  (+) Dental Advidsory Given, Missing, Chipped, Poor Dentition   Pulmonary neg pulmonary ROS, former smoker,    Pulmonary exam normal breath sounds clear to auscultation       Cardiovascular Exercise Tolerance: Good hypertension, (-) angina(-) Past MI and (-) Cardiac Stents Normal cardiovascular exam(-) dysrhythmias (-) Valvular Problems/Murmurs Rhythm:regular Rate:Normal     Neuro/Psych negative neurological ROS  negative psych ROS   GI/Hepatic negative GI ROS, Neg liver ROS,   Endo/Other  negative endocrine ROS  Renal/GU negative Renal ROS  negative genitourinary   Musculoskeletal   Abdominal   Peds  Hematology negative hematology ROS (+)   Anesthesia Other Findings Past Medical History: No date: Deaf     Comment:  left ear 03/01/2018: Essential hypertension No date: Substance abuse ([place])   Reproductive/Obstetrics negative OB ROS                             Anesthesia Physical Anesthesia Plan  ASA: 2  Anesthesia Plan: General   Post-op Pain Management:    Induction: Intravenous  PONV Risk Score and Plan: 2 and Propofol infusion and TIVA  Airway Management Planned: Natural Airway and Nasal Cannula  Additional Equipment:   Intra-op Plan:   Post-operative Plan:   Informed Consent: I have reviewed the patients History and Physical, chart, labs and discussed the procedure including the risks, benefits and alternatives for the proposed anesthesia with the patient or authorized  representative who has indicated his/her understanding and acceptance.     Dental Advisory Given  Plan Discussed with: Anesthesiologist, CRNA and Surgeon  Anesthesia Plan Comments:         Anesthesia Quick Evaluation

## 2021-01-20 NOTE — Op Note (Signed)
[facility] Gastroenterology Patient Name: Bryan Herring Procedure Date: 01/20/2021 10:58 AM MRN: [id number] Account #: [id number] Date of Birth: 25-Jun-1960 Admit Type: Outpatient Age: 61 Room: [facility] ENDO ROOM 3 Gender: Male Note Status: Finalized Instrument Name: Jasper Riling [id number] Procedure:             Colonoscopy Indications:           Screening for colorectal malignant neoplasm Providers:             Jonathon Bellows MD, MD Referring MD:          No Local Md, MD (Referring MD) Medicines:             Monitored Anesthesia Care Complications:         No immediate complications. Procedure:             Pre-Anesthesia Assessment:                        - Prior to the procedure, a History and Physical was                         performed, and patient medications, allergies and                         sensitivities were reviewed. The patient's tolerance                         of previous anesthesia was reviewed.                        - The risks and benefits of the procedure and the                         sedation options and risks were discussed with the                         patient. All questions were answered and informed                         consent was obtained.                        - ASA Grade Assessment: II - A patient with mild                         systemic disease.                        After obtaining informed consent, the colonoscope was                         passed under direct vision. Throughout the procedure,                         the patient's blood pressure, pulse, and oxygen                         saturations were monitored continuously. The                         Colonoscope was  introduced through the anus and                         advanced to the the cecum, identified by the                         appendiceal orifice. The colonoscopy was performed                         with ease. The patient tolerated the procedure well.                          The quality of the bowel preparation was good. Findings:      The perianal and digital rectal examinations were normal.      A 5 mm polyp was found in the rectum. The polyp was sessile. The polyp       was removed with a cold snare. Resection and retrieval were complete.      The exam was otherwise without abnormality on direct and retroflexion       views. Impression:            - One 5 mm polyp in the rectum, removed with a cold                         snare. Resected and retrieved.                        - The examination was otherwise normal on direct and                         retroflexion views. Recommendation:        - Discharge patient to home (with escort).                        - Resume previous diet.                        - Continue present medications.                        - Await pathology results.                        - Repeat colonoscopy for surveillance based on                         pathology results. Procedure Code(s):     --- Professional ---                        [phone], Colonoscopy, flexible; with removal of                         tumor(s), polyp(s), or other lesion(s) by snare                         technique Diagnosis Code(s):     --- Professional ---                        Z12.11, Encounter for screening for malignant neoplasm  of colon                        K62.1, Rectal polyp CPT copyright 2019 American Medical Association. All rights reserved. The codes documented in this report are preliminary and upon coder review may  be revised to meet current compliance requirements. Jonathon Bellows, MD Jonathon Bellows MD, MD 01/20/2021 11:19:54 AM This report has been signed electronically. Number of Addenda: 0 Note Initiated On: 01/20/2021 10:58 AM Scope Withdrawal Time: 0 hours 9 minutes 47 seconds  Total Procedure Duration: 0 hours 12 minutes 7 seconds  Estimated Blood Loss:  Estimated blood loss: none.       [facility]

## 2021-01-21 ENCOUNTER — Encounter: Payer: Self-pay | Admitting: Gastroenterology

## 2021-01-21 LAB — SURGICAL PATHOLOGY

## 2021-01-22 NOTE — Anesthesia Postprocedure Evaluation (Signed)
Anesthesia Post Note  Patient: Bryan Herring  Procedure(s) Performed: COLONOSCOPY WITH PROPOFOL  Patient location during evaluation: Endoscopy Anesthesia Type: General Level of consciousness: awake and alert Pain management: pain level controlled Vital Signs Assessment: post-procedure vital signs reviewed and stable Respiratory status: spontaneous breathing, nonlabored ventilation, respiratory function stable and patient connected to nasal cannula oxygen Cardiovascular status: blood pressure returned to baseline and stable Postop Assessment: no apparent nausea or vomiting Anesthetic complications: no   No notable events documented.   Last Vitals:  Vitals:   01/20/21 1131 01/20/21 1141  BP: 131/90 (!) 155/114  Pulse: 77 75  Resp: 13 14  Temp:    SpO2: 99% 100%    Last Pain:  Vitals:   01/20/21 1141  TempSrc:   PainSc: 0-No pain                 Martha Clan

## 2021-01-31 ENCOUNTER — Encounter: Payer: Self-pay | Admitting: Gastroenterology

## 2021-02-03 ENCOUNTER — Encounter: Payer: Self-pay | Admitting: Gerontology

## 2021-02-03 ENCOUNTER — Ambulatory Visit: Payer: Self-pay | Admitting: Gerontology

## 2021-02-03 ENCOUNTER — Other Ambulatory Visit: Payer: Self-pay

## 2021-02-03 VITALS — BP 104/73 | HR 76 | Temp 97.8°F | Resp 16 | Ht 71.0 in | Wt 153.3 lb

## 2021-02-03 DIAGNOSIS — G8929 Other chronic pain: Secondary | ICD-10-CM

## 2021-02-03 DIAGNOSIS — M25572 Pain in left ankle and joints of left foot: Secondary | ICD-10-CM

## 2021-02-03 DIAGNOSIS — I10 Essential (primary) hypertension: Secondary | ICD-10-CM

## 2021-02-03 DIAGNOSIS — M79674 Pain in right toe(s): Secondary | ICD-10-CM | POA: Insufficient documentation

## 2021-02-03 MED ORDER — CARVEDILOL 12.5 MG PO TABS
12.5000 mg | ORAL_TABLET | Freq: Two times a day (BID) | ORAL | 3 refills | Status: DC
  Filled 2021-02-03: qty 60, 30d supply, fill #0
  Filled 2021-04-07: qty 120, 60d supply, fill #0
  Filled 2021-06-10: qty 120, 60d supply, fill #1
  Filled 2021-06-13: qty 60, 30d supply, fill #0
  Filled 2021-07-15: qty 60, 30d supply, fill #1

## 2021-02-03 MED ORDER — MELOXICAM 7.5 MG PO TABS
7.5000 mg | ORAL_TABLET | Freq: Every day | ORAL | 0 refills | Status: DC
  Filled 2021-02-03 (×2): qty 14, 14d supply, fill #0

## 2021-02-03 MED ORDER — LOSARTAN POTASSIUM 25 MG PO TABS
25.0000 mg | ORAL_TABLET | Freq: Every day | ORAL | 1 refills | Status: DC
Start: 2021-02-03 — End: 2021-07-19
  Filled 2021-02-03: qty 90, 90d supply, fill #0
  Filled 2021-04-07: qty 60, 60d supply, fill #0
  Filled 2021-04-25: qty 60, 60d supply, fill #1
  Filled 2021-04-26: qty 30, 30d supply, fill #1
  Filled 2021-06-20: qty 30, 30d supply, fill #2
  Filled 2021-06-20: qty 30, 30d supply, fill #0

## 2021-02-03 NOTE — Patient Instructions (Signed)

## 2021-02-03 NOTE — Progress Notes (Signed)
Established Patient Office Visit  Subjective:  Patient ID: Bryan Herring, male    DOB: [birth year]  Age: 61 y.o. MRN: [id number]  CC:  Chief Complaint  Patient presents with   Follow-up    HPI Bryan Herring   is a 61 year old male who has history of hypertension, HFrEF, substance use disorder, deaf to left ear  presents for routine follow-up visit presents for routine follow up visit and medication refill. Currently, he c/o pain to 2nd toe of right foot, shaped like a hammer. He states that it's painful with wearing a shoe and walking and painful when touched. He states that it has been going on for 3 months, denies any relieving factor.He had Colonoscopy screening done on 01/20/21 by Dr Jonathon Bellows and One 5 mm polyp in the rectum, removed with a cold snare. Resected and retrieved. - The examination was otherwise normal on direct and retroflexion views. He was also seen at the Orthopedic clinic on 01/11/21 by Dr Tempie Donning C with regards to right 5th finger deformity, and he clines amputation of the finger. Overall, he states that he's doing well and offers no further complaint.     Past Medical History:  Diagnosis Date   Deaf    left ear   Essential hypertension 03/01/2018   Substance abuse ([place])     Past Surgical History:  Procedure Laterality Date   ACHILLES TENDON SURGERY     COLONOSCOPY WITH PROPOFOL N/A 01/20/2021   Procedure: COLONOSCOPY WITH PROPOFOL;  Surgeon: Jonathon Bellows, MD;  Location: [facility] ENDOSCOPY;  Service: Gastroenterology;  Laterality: N/A;    Family History  Problem Relation Age of Onset   Aneurysm Mother    Other Father        unknown medical history   Hypertension Brother    Other Maternal Grandmother        unknown medical history   Other Maternal Grandfather        unknown medical history   Other Paternal Grandmother        unknown medical history   Other Paternal Grandfather        unknown medical history   Hypertension Maternal Aunt      Social History   Socioeconomic History   Marital status: Single    Spouse name: Not on file   Number of children: Not on file   Years of education: Not on file   Highest education level: Not on file  Occupational History   Not on file  Tobacco Use   Smoking status: Former    Packs/day: 0.25    Types: Cigarettes    Quit date: 04/2020    Years since quitting: 0.8   Smokeless tobacco: Never   Tobacco comments:    Patient wearing nicotine patch  Vaping Use   Vaping Use: Never used  Substance and Sexual Activity   Alcohol use: Not Currently    Comment: last use April 14, 2020 (resident at [facility])   Drug use: Not Currently    Types: Marijuana, Heroin    Comment: last use April 14, 2020 (resident at [facility])   Sexual activity: Not Currently  Other Topics Concern   Not on file  Social History Narrative   Not on file   Social Determinants of Health   Financial Resource Strain: Not on file  Food Insecurity: Food Insecurity Present   Worried About [place] in the Last Year: Sometimes true   [place] in the Last Year:  Sometimes true  Transportation Needs: Unmet Transportation Needs   Lack of Transportation (Medical): Yes   Lack of Transportation (Non-Medical): Yes  Physical Activity: Not on file  Stress: Not on file  Social Connections: Not on file  Intimate Partner Violence: Not on file    Outpatient Medications Prior to Visit  Medication Sig Dispense Refill   clotrimazole-betamethasone (LOTRISONE) cream Apply 1 application topically once daily. 30 g 0   melatonin 5 MG TABS Take 2 tablets (10 mg total) by mouth at bedtime. 60 tablet 1   nicotine (NICODERM CQ - DOSED IN MG/24 HOURS) 14 mg/24hr patch Place 14 mg onto the skin daily.     Salicylic Acid 0.5 % LOTN Apply to plantars warts twice a day 120 mL 0   carvedilol (COREG) 12.5 MG tablet Take 1 tablet (12.5 mg total) by mouth 2 (two) times daily with a meal. 60 tablet 3   losartan (COZAAR) 25 MG tablet  Take 1 tablet (25 mg total) by mouth daily. 90 tablet 1   meloxicam (MOBIC) 7.5 MG tablet Take 1 tablet (7.5 mg total) by mouth once daily. (Patient not taking: Reported on 02/03/2021) 14 tablet 0   polyethylene glycol (GOLYTELY) 236 g solution Prepare according to package instructions. Starting at 5:00PM: Drink one 8 oz glass of mixture every 15 minutes until you finish half of the jug. 5 hours prior to procedure, drink one 8 oz glass of mixture every 15 minutes until it is all gone. Make sure you do not drink anything 4 hours prior to your procedure. 4000 mL 0   No facility-administered medications prior to visit.    Allergies  Allergen Reactions   No Known Allergies     ROS Review of Systems  Constitutional: Negative.   Eyes: Negative.   Respiratory: Negative.    Cardiovascular: Negative.   Musculoskeletal:  Positive for arthralgias (pain to 2nd toe on right foot).  Neurological: Negative.   Psychiatric/Behavioral: Negative.       Objective:    Physical Exam HENT:     Head: Normocephalic and atraumatic.     Mouth/Throat:     Mouth: Mucous membranes are moist.  Eyes:     Extraocular Movements: Extraocular movements intact.     Conjunctiva/sclera: Conjunctivae normal.     Pupils: Pupils are equal, round, and reactive to light.  Cardiovascular:     Rate and Rhythm: Normal rate and regular rhythm.     Pulses: Normal pulses.     Heart sounds: Normal heart sounds.  Pulmonary:     Effort: Pulmonary effort is normal.     Breath sounds: Normal breath sounds.  Musculoskeletal:        General: Deformity (hammer shaped 2nd toe to right foot. Tenderness with palpation.) present.  Neurological:     General: No focal deficit present.     Mental Status: He is alert and oriented to person, place, and time. Mental status is at baseline.  Psychiatric:        Mood and Affect: Mood normal.        Behavior: Behavior normal.        Thought Content: Thought content normal.         Judgment: Judgment normal.    BP 104/73 (BP Location: Right Arm, Patient Position: Sitting, Cuff Size: Large)    Pulse 76    Temp 97.8 F (36.6 C) (Oral)    Resp 16    Ht '5\' 11"'  (1.803 m)    Wt 153  lb 4.8 oz (69.5 kg)    SpO2 97%    BMI 21.38 kg/m  Wt Readings from Last 3 Encounters:  02/03/21 153 lb 4.8 oz (69.5 kg)  01/20/21 153 lb (69.4 kg)  01/11/21 153 lb 6.4 oz (69.6 kg)     Health Maintenance Due  Topic Date Due   COVID-19 Vaccine (1) Never done   Hepatitis C Screening  Never done   TETANUS/TDAP  Never done   Zoster Vaccines- Shingrix (1 of 2) Never done    There are no preventive care reminders to display for this patient.  Lab Results  Component Value Date   TSH 1.589 02/03/2019   Lab Results  Component Value Date   WBC 7.9 07/21/2020   HGB 11.3 (L) 07/21/2020   HCT 33.3 (L) 07/21/2020   MCV 91.5 07/21/2020   PLT 207 07/21/2020   Lab Results  Component Value Date   NA 140 07/21/2020   K 3.6 07/21/2020   CO2 29 07/21/2020   GLUCOSE 115 (H) 07/21/2020   BUN 33 (H) 07/21/2020   CREATININE 0.89 07/21/2020   BILITOT 0.3 04/28/2020   ALKPHOS 73 04/28/2020   AST 15 04/28/2020   ALT 30 04/28/2020   PROT 7.4 04/28/2020   ALBUMIN 4.3 04/28/2020   CALCIUM 10.0 07/21/2020   ANIONGAP 10 07/21/2020   EGFR 95 04/28/2020   Lab Results  Component Value Date   CHOL 191 02/03/2021   Lab Results  Component Value Date   HDL 55 02/03/2021   Lab Results  Component Value Date   LDLCALC 115 (H) 02/03/2021   Lab Results  Component Value Date   TRIG 116 02/03/2021   Lab Results  Component Value Date   CHOLHDL 3.5 02/03/2021   Lab Results  Component Value Date   HGBA1C 5.8 (H) 02/03/2021      Assessment & Plan:    1. Essential hypertension - He will continue current medication, DASH diet and labs will be checked. - carvedilol (COREG) 12.5 MG tablet; Take 1 tablet (12.5 mg total) by mouth 2 (two) times daily with a meal.  Dispense: 60 tablet; Refill:  3 - losartan (COZAAR) 25 MG tablet; Take 1 tablet (25 mg total) by mouth once daily.  Dispense: 90 tablet; Refill: 1 - Lipid panel; Future - HgB A1c; Future - HgB A1c - Lipid panel  2. Pain in toe of right foot - He will continue on Meloxicam, educated on medication side effects and advised to notify clinic. He will follow up at the Orthopedic. - meloxicam (MOBIC) 7.5 MG tablet; Take 1 tablet (7.5 mg total) by mouth once daily.  Dispense: 14 tablet; Refill: 0 - Ambulatory referral to Podiatry      Follow-up: Return in about 3 months (around 05/04/2021), or if symptoms worsen or fail to improve.    Adira Limburg Jerold Coombe, NP

## 2021-02-04 ENCOUNTER — Other Ambulatory Visit: Payer: Self-pay

## 2021-02-04 LAB — LIPID PANEL
Chol/HDL Ratio: 3.5 ratio (ref 0.0–5.0)
Cholesterol, Total: 191 mg/dL (ref 100–199)
HDL: 55 mg/dL (ref >39)
LDL Chol Calc (NIH): 115 mg/dL — ABNORMAL HIGH (ref 0–99)
Triglycerides: 116 mg/dL (ref 0–149)
VLDL Cholesterol Cal: 21 mg/dL (ref 5–40)

## 2021-02-04 LAB — HEMOGLOBIN A1C
Est. average glucose Bld gHb Est-mCnc: 120 mg/dL
Hgb A1c MFr Bld: 5.8 % — ABNORMAL HIGH (ref 4.8–5.6)

## 2021-02-28 ENCOUNTER — Other Ambulatory Visit: Payer: Self-pay

## 2021-03-03 ENCOUNTER — Ambulatory Visit: Payer: Self-pay | Admitting: Podiatry

## 2021-04-07 ENCOUNTER — Other Ambulatory Visit: Payer: Self-pay

## 2021-04-25 ENCOUNTER — Other Ambulatory Visit: Payer: Self-pay

## 2021-04-26 ENCOUNTER — Other Ambulatory Visit: Payer: Self-pay

## 2021-04-28 ENCOUNTER — Other Ambulatory Visit: Payer: Self-pay

## 2021-04-28 ENCOUNTER — Ambulatory Visit (INDEPENDENT_AMBULATORY_CARE_PROVIDER_SITE_OTHER): Payer: No Typology Code available for payment source | Admitting: Podiatry

## 2021-04-28 DIAGNOSIS — Q828 Other specified congenital malformations of skin: Secondary | ICD-10-CM

## 2021-04-28 DIAGNOSIS — R200 Anesthesia of skin: Secondary | ICD-10-CM

## 2021-04-28 DIAGNOSIS — R202 Paresthesia of skin: Secondary | ICD-10-CM

## 2021-04-28 MED ORDER — LIDOCAINE 5 % EX OINT
1.0000 "application " | TOPICAL_OINTMENT | CUTANEOUS | 0 refills | Status: DC | PRN
  Filled 2021-04-28: qty 35.44, 30d supply, fill #0

## 2021-04-28 NOTE — Progress Notes (Signed)
?Subjective:  ?Patient ID: Bryan Herring, male    DOB: [birth year],  MRN: [id number] ? ?Chief Complaint  ?Patient presents with  ? Callouses  ? ? ?61 y.o. male presents with the above complaint.  Patient presents with follow-up of bilateral hyperkeratotic lesions/porokeratosis to plantar TN joint.  He states it always hurts and is progressive gotten worse he cannot stand for long period time is painful to touch.  He has not tried lidocaine gel ?Review of Systems: Negative except as noted in the HPI. Denies N/V/F/Ch. ? ?Past Medical History:  ?Diagnosis Date  ? Deaf   ? left ear  ? Essential hypertension 03/01/2018  ? Substance abuse ([place])   ? ? ?Current Outpatient Medications:  ?  lidocaine (XYLOCAINE) 5 % ointment, Apply 1 application topically as needed., Disp: 35.44 g, Rfl: 0 ?  carvedilol (COREG) 12.5 MG tablet, Take 1 tablet (12.5 mg total) by mouth 2 (two) times daily with a meal., Disp: 60 tablet, Rfl: 3 ?  clotrimazole-betamethasone (LOTRISONE) cream, Apply 1 application topically once daily., Disp: 30 g, Rfl: 0 ?  losartan (COZAAR) 25 MG tablet, Take 1 tablet (25 mg total) by mouth once daily., Disp: 90 tablet, Rfl: 1 ?  melatonin 5 MG TABS, Take 2 tablets (10 mg total) by mouth at bedtime., Disp: 60 tablet, Rfl: 1 ?  meloxicam (MOBIC) 7.5 MG tablet, Take 1 tablet (7.5 mg total) by mouth once daily., Disp: 14 tablet, Rfl: 0 ?  nicotine (NICODERM CQ - DOSED IN MG/24 HOURS) 14 mg/24hr patch, Place 14 mg onto the skin daily., Disp: , Rfl:  ?  Salicylic Acid 0.5 % LOTN, Apply to plantars warts twice a day, Disp: 120 mL, Rfl: 0 ? ?Social History  ? ?Tobacco Use  ?Smoking Status Former  ? Packs/day: 0.25  ? Types: Cigarettes  ? Quit date: 04/2020  ? Years since quitting: 1.0  ?Smokeless Tobacco Never  ?Tobacco Comments  ? Patient wearing nicotine patch  ? ? ?Allergies  ?Allergen Reactions  ? No Known Allergies   ? ?Objective:  ?There were no vitals filed for this visit. ?There is no height or weight on  file to calculate BMI. ?Constitutional Well developed. ?Well nourished.  ?Vascular Dorsalis pedis pulses palpable bilaterally. ?Posterior tibial pulses palpable bilaterally. ?Capillary refill normal to all digits.  ?No cyanosis or clubbing noted. ?Pedal hair growth normal.  ?Neurologic Normal speech. ?Oriented to person, place, and time. ?Epicritic sensation to light touch grossly present bilaterally.  ?Dermatologic Hyperkeratotic lesion with central nucleated core noted to bilateral plantar hindfoot at the TN joint.  Pain on palpation to the lesion.  Upon debridement no pinpoint bleeding noted  ?Orthopedic: Normal joint ROM without pain or crepitus bilaterally. ?No visible deformities. ?No bony tenderness.  ? ?Radiographs: None ?Assessment:  ? ?1. Porokeratosis   ?2. Numbness and tingling   ? ? ? ? ?Plan:  ?Patient was evaluated and treated and all questions answered. ? ?Bilateral plantar hindfoot porokeratosis ?-I explained the patient the etiology of porokeratosis and various treatment options was discussed.  Given the amount of pain that is having I believe he will benefit from debridement of the lesion.  Using chisel blade and handle the lesion was debrided down to healthy striated tissue.  No complication noted no pinpoint bleeding noted. ?-I discussed with him that shoe gear modification would help he has severe pes planovalgus foot structure leading to compensation and callus formation.   ?\-Lidocaine cream was dispensed for numbing in the callus area to  give him some temporary relief ?-Patient constantly works 8 to 10 hours on his feet with boots on given the severe nature of the pes planovalgus deformity patient is unable to do so and will benefit from benefit from disability. ? ?No follow-ups on file. ? ?

## 2021-04-29 ENCOUNTER — Other Ambulatory Visit: Payer: Self-pay

## 2021-05-02 ENCOUNTER — Telehealth: Payer: Self-pay | Admitting: Podiatry

## 2021-05-02 NOTE — Telephone Encounter (Signed)
Pt has concerns with the power steps orthotics that he purchased in office on last Thursday. He states that he had blisters on the bottom of his right foot and as soon as he put on the orthotics in his shoes, it was very painful and cannot wear them. He is wanting to return them for a refund. I advised that these are non refundable. He is asking to speak to the Dr. Posey Pronto. ? ?Please advise.  ?

## 2021-05-05 ENCOUNTER — Other Ambulatory Visit: Payer: Self-pay

## 2021-05-05 ENCOUNTER — Ambulatory Visit: Payer: Self-pay | Admitting: Gerontology

## 2021-05-05 ENCOUNTER — Encounter: Payer: Self-pay | Admitting: Gerontology

## 2021-05-05 VITALS — BP 135/96 | HR 78 | Temp 98.0°F | Ht 71.0 in | Wt 159.2 lb

## 2021-05-05 DIAGNOSIS — L84 Corns and callosities: Secondary | ICD-10-CM

## 2021-05-05 DIAGNOSIS — R059 Cough, unspecified: Secondary | ICD-10-CM

## 2021-05-05 MED ORDER — BENZONATATE 100 MG PO CAPS
100.0000 mg | ORAL_CAPSULE | Freq: Three times a day (TID) | ORAL | 0 refills | Status: DC | PRN
  Filled 2021-05-05: qty 20, 7d supply, fill #0

## 2021-05-05 MED ORDER — MELOXICAM 7.5 MG PO TABS
7.5000 mg | ORAL_TABLET | Freq: Every day | ORAL | 0 refills | Status: DC
  Filled 2021-05-05: qty 30, 30d supply, fill #0

## 2021-05-05 NOTE — Progress Notes (Signed)
? ?New Patient Office Visit ? ?Subjective   ? ?Patient ID: Bryan Herring, male    DOB: [birth year]  Age: 61 y.o. MRN: [id number] ? ?CC:  ?Chief Complaint  ?Patient presents with  ? Follow-up  ?  Right foot pain on the top of his foot/ankle/shin. Pain started yesterday after working 10hr shift.  ? Cough  ?  Cough runny nose since Friday. [facility] checked for COVID - negative. "Stayed in bed and did not work" Sat-Monday  ? ? ?HPI ? 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 He continues to reside at [facility] for rehabilitation. He states that he coughs up yellowish colored phlegm. He states that he had a temperature of 99+ on Saturday when checked at [facility]. Covid test done was negative, denies sick contact. He reports taking Mucinex which relieved the cough minimally. He smokes about 3-5 sticks of cigarette daily and admits the desire to quit. He denies any shortness of breath, chills and fatigue. He was seen by the podiatrist , Dr. Posey Pronto on 04/28/21 at [facility]  for debridement of the callouses on bilateral posterior feet.  He states that it's painful with wearing shoe and walking and painful when touched. He states that he worked 10 hours yesterday and he experienced so much pain standing on his feet and the pain was 9-10/10 on a pain scale.  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  Overall, he states that he's doing well and offers no further complaint. ?  ? ?Outpatient Encounter Medications as of 05/05/2021  ?Medication Sig  ? carvedilol (COREG) 12.5 MG tablet Take 1 tablet (12.5 mg total) by mouth 2 (two) times daily with a meal.  ? losartan (COZAAR) 25 MG tablet Take 1 tablet (25 mg total) by mouth once daily.  ? melatonin 5 MG TABS Take 2 tablets (10 mg total) by mouth at bedtime.   ? clotrimazole-betamethasone (LOTRISONE) cream Apply 1 application topically once daily. (Patient not taking: Reported on 05/05/2021)  ? lidocaine (XYLOCAINE) 5 % ointment Apply 1 application topically daily as needed. (Patient not taking: Reported on 05/05/2021)  ? meloxicam (MOBIC) 7.5 MG tablet Take 1 tablet (7.5 mg total) by mouth once daily. (Patient not taking: Reported on 05/05/2021)  ? nicotine (NICODERM CQ - DOSED IN MG/24 HOURS) 14 mg/24hr patch Place 14 mg onto the skin daily. (Patient not taking: Reported on 05/05/2021)  ? Salicylic Acid 0.5 % LOTN Apply to plantars warts twice a day (Patient not taking: Reported on 05/05/2021)  ? ?No facility-administered encounter medications on file as of 05/05/2021.  ? ? ?Past Medical History:  ?Diagnosis Date  ? Deaf   ? left ear  ? Essential hypertension 03/01/2018  ? Substance abuse ([place])   ? ? ?Past Surgical History:  ?Procedure Laterality Date  ? ACHILLES TENDON SURGERY    ? COLONOSCOPY WITH PROPOFOL N/A 01/20/2021  ? Procedure: COLONOSCOPY WITH PROPOFOL;  Surgeon: Jonathon Bellows, MD;  Location: [facility] ENDOSCOPY;  Service: Gastroenterology;  Laterality: N/A;  ? ? ?Family History  ?Problem Relation Age of Onset  ? Aneurysm Mother   ? Other Father   ?     unknown medical history  ? Hypertension Brother   ? Other Maternal Grandmother   ?  unknown medical history  ? Other Maternal Grandfather   ?     unknown medical history  ? Other Paternal Grandmother   ?     unknown medical history  ? Other Paternal Grandfather   ?     unknown medical history  ? Hypertension Maternal Aunt   ? ? ?Social History  ? ?Socioeconomic History  ? Marital status: Single  ?  Spouse name: Not on file  ? Number of children: Not on file  ? Years of education: Not on file  ? Highest education level: Not on file  ?Occupational History  ? Not on file  ?Tobacco Use  ? Smoking status: Former  ?  Packs/day: 0.25  ?  Types: Cigarettes  ?  Quit date: 04/2020  ?  Years since quitting: 1.0  ? Smokeless  tobacco: Never  ? Tobacco comments:  ?  Patient wearing nicotine patch  ?Vaping Use  ? Vaping Use: Never used  ?Substance and Sexual Activity  ? Alcohol use: Not Currently  ?  Comment: last use April 14, 2020 (resident at [facility])  ? Drug use: Not Currently  ?  Types: Marijuana, Heroin  ?  Comment: last use April 14, 2020 (resident at [place])  ? Sexual activity: Not Currently  ?Other Topics Concern  ? Not on file  ?Social History Narrative  ? Not on file  ? ?Social Determinants of Health  ? ?Financial Resource Strain: Not on file  ?Food Insecurity: Food Insecurity Present  ? Worried About [employer] fund[place] in the Last Year: Sometimes true  ? Ran Out of Food in the Last Year: Sometimes true  ?Transportation Needs: Unmet Transportation Needs  ? Lack of Transportation (Medical): Yes  ? Lack of Transportation (Non-Medical): Yes  ?Physical Activity: Not on file  ?Stress: Not on file  ?Social Connections: Not on file  ?Intimate Partner Violence: Not on file  ? ? ?Review of Systems  ?Constitutional: Negative.   ?HENT: Negative.    ?Respiratory:  Positive for cough. Negative for shortness of breath.   ?Cardiovascular: Negative.   ?Musculoskeletal: Negative.   ?Skin:   ?     Callous to feet  ?Neurological: Negative.   ?Psychiatric/Behavioral: Negative.    ? ?  ? ? ?Objective   ? ?BP (!) 135/96 (BP Location: Left Arm, Patient Position: Sitting, Cuff Size: Large)   Pulse 78   Temp 98 ?F (36.7 ?C) (Oral)   Ht '5\' 11"'$  (1.803 m)   Wt 159 lb 3.2 oz (72.2 kg)   SpO2 94%   BMI 22.20 kg/m?  ? ?Physical Exam ?HENT:  ?   Head: Normocephalic and atraumatic.  ?Cardiovascular:  ?   Rate and Rhythm: Normal rate and regular rhythm.  ?   Pulses: Normal pulses.  ?   Heart sounds: Normal heart sounds.  ?Pulmonary:  ?   Effort: Pulmonary effort is normal.  ?   Breath sounds: Normal breath sounds.  ?Musculoskeletal:  ?   Cervical back: Normal range of motion.  ?Feet:  ?   Right foot:  ?   Skin integrity: Callus (on bilateral feet) and dry skin  (around the callous area) present.  ?Neurological:  ?   General: No focal deficit present.  ?   Mental Status: He is alert and oriented to person, place, and time. Mental status is at baseline.  ?Psychiatric:     ?   Mood and Affect: Mood normal.     ?   Behavior: Behavior normal.     ?  Thought Content: Thought content normal.     ?   Judgment: Judgment normal.  ? ? ? ?  ? ?Assessment & Plan:  ? ?1. Callus of foot ?He was seen by Dr. Posey Pronto on 04/28/21 for callous debridement. He was given insoles for his shoes but he said the insoles are not relieving the pressure on the callous.He will be started on Meloxicam. He encouraged to go the the ED for increased symptoms and F/U with the Dr. Posey Pronto. ?Meloxicam 7.5 mg 1 PO daily for pain ?  ?2. Cough in adult ? He will  continue on mucinex and will  be started on Benzonatate. He was educated on medication side effects and advised to notify clinic. He  was encouraged to report if the cough persist. Take lots of fluid  and take warm shower to help loosen the mucus. ?He was strongly advised on smoking cessation. ?Benzonatate 100 mg 1 PO  3 times daily for cough. ? ?Follow up in 5 weeks or  if symptoms worsen or fail to improve ? ?Lonia Skinner, RN ? ? ?

## 2021-05-12 ENCOUNTER — Other Ambulatory Visit: Payer: Self-pay

## 2021-06-09 ENCOUNTER — Ambulatory Visit: Payer: Self-pay | Admitting: Gerontology

## 2021-06-10 ENCOUNTER — Ambulatory Visit (INDEPENDENT_AMBULATORY_CARE_PROVIDER_SITE_OTHER): Payer: No Typology Code available for payment source | Admitting: Cardiology

## 2021-06-10 ENCOUNTER — Encounter: Payer: Self-pay | Admitting: Cardiology

## 2021-06-10 VITALS — BP 138/90 | HR 71 | Ht 71.0 in | Wt 162.0 lb

## 2021-06-10 DIAGNOSIS — I10 Essential (primary) hypertension: Secondary | ICD-10-CM

## 2021-06-10 DIAGNOSIS — F17200 Nicotine dependence, unspecified, uncomplicated: Secondary | ICD-10-CM

## 2021-06-10 DIAGNOSIS — I5020 Unspecified systolic (congestive) heart failure: Secondary | ICD-10-CM

## 2021-06-10 NOTE — Patient Instructions (Signed)
Medication Instructions:   Your physician recommends that you continue on your current medications as directed. Please refer to the Current Medication list given to you today.    Follow-Up: At [facility], you and your health needs are our priority.  As part of our continuing mission to provide you with exceptional heart care, we have created designated Provider Care Teams.  These Care Teams include your primary Cardiologist (physician) and Advanced Practice Providers (APPs -  Physician Assistants and Nurse Practitioners) who all work together to provide you with the care you need, when you need it.  We recommend signing up for the patient portal called "MyChart".  Sign up information is provided on this After Visit Summary.  MyChart is used to connect with patients for Virtual Visits (Telemedicine).  Patients are able to view lab/test results, encounter notes, upcoming appointments, etc.  Non-urgent messages can be sent to your provider as well.   To learn more about what you can do with MyChart, go to [email or web address].    Your next appointment:   1 year(s)  The format for your next appointment:   In Person  Provider:   You may see Kate Sable, MD or one of the following Advanced Practice Providers on your designated Care Team:   Murray Hodgkins, NP Christell Faith, PA-C Cadence Kathlen Mody, [place]    Other Instructions   Important Information About Sugar

## 2021-06-10 NOTE — Progress Notes (Signed)
Cardiology Office Note:    Date:  06/10/2021   ID:  Bryan Herring, DOB 07-31-1960, MRN [id number]  PCP:  Langston Reusing, NP   [facility] Providers Cardiologist:  Kate Sable, MD     Referring MD: Langston Reusing, NP   No chief complaint on file.   History of Present Illness:    Bryan Herring is a 61 y.o. male with a hx of left ear deafness, cardiomyopathy ( initial EF 40 to 45%, normalized to 55%), hypertension, prior substance abuse (heroin, coccaine x 30+yrs), current smoker who presents for follow-up.  Being seen for cardiomyopathy, likely nonischemic due to substance abuse.  Started on Coreg and losartan, EF normalized.  Denies chest pain or shortness of breath.  Has a flat foot, sees podiatry for this.  He is working on obtaining disability.  Denies edema, chest pain.  Still smokes, is working on quitting.  . Prior notes Echo 11/2020 EF 50 to 55% Echo 01/2019 EF 40 to 45%, impaired relaxation, no LVH. Previously stopped chlorthalidone due to dehydration.  Past Medical History:  Diagnosis Date   Deaf    left ear   Essential hypertension 03/01/2018   Substance abuse ([place])     Past Surgical History:  Procedure Laterality Date   ACHILLES TENDON SURGERY     COLONOSCOPY WITH PROPOFOL N/A 01/20/2021   Procedure: COLONOSCOPY WITH PROPOFOL;  Surgeon: Jonathon Bellows, MD;  Location: [facility] ENDOSCOPY;  Service: Gastroenterology;  Laterality: N/A;    Current Medications: Current Meds  Medication Sig   benzonatate (TESSALON PERLES) 100 MG capsule Take 1 capsule (100 mg total) by mouth 3 (three) times daily as needed for cough.   carvedilol (COREG) 12.5 MG tablet Take 1 tablet (12.5 mg total) by mouth 2 (two) times daily with a meal.   losartan (COZAAR) 25 MG tablet Take 1 tablet (25 mg total) by mouth once daily.   melatonin 5 MG TABS Take 2 tablets (10 mg total) by mouth at bedtime.   meloxicam (MOBIC) 7.5 MG tablet Take 1 tablet (7.5 mg  total) by mouth once daily.     Allergies:   No known allergies   Social History   Socioeconomic History   Marital status: Single    Spouse name: Not on file   Number of children: Not on file   Years of education: Not on file   Highest education level: Not on file  Occupational History   Not on file  Tobacco Use   Smoking status: Former    Packs/day: 0.25    Types: Cigarettes    Quit date: 04/2020    Years since quitting: 1.1   Smokeless tobacco: Never   Tobacco comments:    Patient wearing nicotine patch  Vaping Use   Vaping Use: Never used  Substance and Sexual Activity   Alcohol use: Not Currently    Comment: last use April 14, 2020 (resident at [facility])   Drug use: Not Currently    Types: Marijuana, Heroin    Comment: last use April 14, 2020 (resident at [facility])   Sexual activity: Not Currently  Other Topics Concern   Not on file  Social History Narrative   Not on file   Social Determinants of Health   Financial Resource Strain: Not on file  Food Insecurity: Food Insecurity Present   Worried About [place] in the Last Year: Sometimes true   Ran Out of Food in the Last Year: Sometimes true  Transportation Needs:  Unmet Transportation Needs   Lack of Transportation (Medical): Yes   Lack of Transportation (Non-Medical): Yes  Physical Activity: Not on file  Stress: Not on file  Social Connections: Not on file     Family History: The patient's family history includes Aneurysm in his mother; Hypertension in his brother and maternal aunt; Other in his father, maternal grandfather, maternal grandmother, paternal grandfather, and paternal grandmother.  ROS:   Please see the history of present illness.     All other systems reviewed and are negative.  EKGs/Labs/Other Studies Reviewed:    The following studies were reviewed today:   EKG:  EKG is ordered today.  EKG shows normal sinus rhythm, PACs.  Recent Labs: 07/21/2020: BUN 33; Creatinine, Ser 0.89;  Hemoglobin 11.3; Platelets 207; Potassium 3.6; Sodium 140  Recent Lipid Panel    Component Value Date/Time   CHOL 191 02/03/2021 1335   TRIG 116 02/03/2021 1335   HDL 55 02/03/2021 1335   CHOLHDL 3.5 02/03/2021 1335   LDLCALC 115 (H) 02/03/2021 1335     Risk Assessment/Calculations:          Physical Exam:    VS:  BP 138/90   Pulse 71   Ht '5\' 11"'$  (1.803 m)   Wt 162 lb (73.5 kg)   SpO2 97%   BMI 22.59 kg/m     Wt Readings from Last 3 Encounters:  06/10/21 162 lb (73.5 kg)  05/05/21 159 lb 3.2 oz (72.2 kg)  02/03/21 153 lb 4.8 oz (69.5 kg)     GEN:  Well nourished, well developed in no acute distress HEENT: Normal NECK: No JVD; No carotid bruits CARDIAC: RRR, no murmurs, rubs, gallops RESPIRATORY:  Clear to auscultation without rales, wheezing or rhonchi  ABDOMEN: Soft, non-tender, non-distended MUSCULOSKELETAL:  No edema; No deformity  SKIN: Warm and dry NEUROLOGIC:  Alert and oriented x 3 PSYCHIATRIC:  Normal affect   ASSESSMENT:    1. HFrEF (heart failure with reduced ejection fraction) ([place])   2. Primary hypertension   3. Smoking    PLAN:    In order of problems listed above:  Cardiomyopathy, prior echo 01/2019 with EF 40 to 45%.  Likely nonischemic secondary to prior polysubstance use/cocaine/heroin use.  Repeat echocardiogram 11/2020 showed improvement in EF 50 to 55%.,  Prior [employer] with no  ischemia. Continue Coreg 12.5 mg twice daily, losartan '25mg'$  qd. Hypertension, continue Coreg, losartan '25mg'$  qd. Current smoker, cessation advised.  Follow-up in 12 months.    Medication Adjustments/Labs and Tests Ordered: Current medicines are reviewed at length with the patient today.  Concerns regarding medicines are outlined above.  Orders Placed This Encounter  Procedures   EKG 12-Lead     No orders of the defined types were placed in this encounter.     Patient Instructions  Medication Instructions:   Your physician recommends that  you continue on your current medications as directed. Please refer to the Current Medication list given to you today.    Follow-Up: At [facility], you and your health needs are our priority.  As part of our continuing mission to provide you with exceptional heart care, we have created designated Provider Care Teams.  These Care Teams include your primary Cardiologist (physician) and Advanced Practice Providers (APPs -  Physician Assistants and Nurse Practitioners) who all work together to provide you with the care you need, when you need it.  We recommend signing up for the patient portal called "MyChart".  Sign up information is  provided on this After Visit Summary.  MyChart is used to connect with patients for Virtual Visits (Telemedicine).  Patients are able to view lab/test results, encounter notes, upcoming appointments, etc.  Non-urgent messages can be sent to your provider as well.   To learn more about what you can do with MyChart, go to [email or web address].    Your next appointment:   1 year(s)  The format for your next appointment:   In Person  Provider:   You may see Kate Sable, MD or one of the following Advanced Practice Providers on your designated Care Team:   Murray Hodgkins, NP Christell Faith, PA-C Cadence Kathlen Mody, [place]    Other Instructions   Important Information About Sugar         Signed, Kate Sable, MD  06/10/2021 3:50 PM    [place]

## 2021-06-13 ENCOUNTER — Other Ambulatory Visit: Payer: Self-pay

## 2021-06-14 ENCOUNTER — Other Ambulatory Visit: Payer: Self-pay

## 2021-06-15 ENCOUNTER — Other Ambulatory Visit: Payer: Self-pay | Admitting: Pharmacy Technician

## 2021-06-15 NOTE — Patient Outreach (Signed)
Patient has prescription drug coverage with [employer].  No longer qualifies for free medications.  Jacquelynn Cree Patient Advocate Specialist [place]

## 2021-06-20 ENCOUNTER — Other Ambulatory Visit: Payer: Self-pay

## 2021-06-27 ENCOUNTER — Other Ambulatory Visit: Payer: Self-pay | Admitting: Gerontology

## 2021-06-27 ENCOUNTER — Other Ambulatory Visit: Payer: Self-pay

## 2021-06-27 DIAGNOSIS — L84 Corns and callosities: Secondary | ICD-10-CM

## 2021-06-28 MED ORDER — MELOXICAM 7.5 MG PO TABS
7.5000 mg | ORAL_TABLET | Freq: Every day | ORAL | 0 refills | Status: DC
  Filled 2021-06-28 – 2021-06-29 (×3): qty 30, 30d supply, fill #0

## 2021-06-29 ENCOUNTER — Other Ambulatory Visit: Payer: Self-pay

## 2021-07-15 ENCOUNTER — Ambulatory Visit: Payer: No Typology Code available for payment source | Admitting: Podiatry

## 2021-07-15 ENCOUNTER — Ambulatory Visit: Payer: No Typology Code available for payment source

## 2021-07-15 ENCOUNTER — Encounter: Payer: Self-pay | Admitting: Podiatry

## 2021-07-15 ENCOUNTER — Other Ambulatory Visit: Payer: Self-pay

## 2021-07-15 DIAGNOSIS — M79674 Pain in right toe(s): Secondary | ICD-10-CM

## 2021-07-15 DIAGNOSIS — R609 Edema, unspecified: Secondary | ICD-10-CM

## 2021-07-15 DIAGNOSIS — M2142 Flat foot [pes planus] (acquired), left foot: Secondary | ICD-10-CM

## 2021-07-15 DIAGNOSIS — M79675 Pain in left toe(s): Secondary | ICD-10-CM

## 2021-07-15 DIAGNOSIS — B351 Tinea unguium: Secondary | ICD-10-CM

## 2021-07-15 DIAGNOSIS — M2141 Flat foot [pes planus] (acquired), right foot: Secondary | ICD-10-CM

## 2021-07-15 NOTE — Progress Notes (Signed)
   Subjective:  61 y.o. male presenting today for follow-up evaluation of pain and tenderness is associated to flatfoot deformity.  Patient states that he has fallen arches.  They are very painful.  Last visit here in the office on 04/28/2021 OTC power step insoles were dispensed which only aggravated the patient's feet more.  He presents for further treatment and evaluation.  He is also requesting a nail trim.  Patient states that his nails are very painful and tender and he is unable to trim his own nails.   Past Medical History:  Diagnosis Date   Deaf    left ear   Essential hypertension 03/01/2018   Substance abuse ([place])        Objective/Physical Exam General: The patient is alert and oriented x3 in no acute distress.  Dermatology: Skin is warm, dry and supple bilateral lower extremities. Negative for open lesions or macerations.  Hyperkeratotic discolored elongated dystrophic nails noted 1-5 bilateral  Vascular: Palpable pedal pulses bilaterally. No edema or erythema noted. Capillary refill within normal limits.  Neurological: Epicritic and protective threshold grossly intact bilaterally.   Musculoskeletal Exam: Range of motion within normal limits to all pedal and ankle joints bilateral. Muscle strength 5/5 in all groups bilateral.  Upon weightbearing there is a medial longitudinal arch collapse bilaterally. Remove foot valgus noted to the bilateral lower extremities with excessive pronation upon mid stance.  Radiographic Exam:  Normal osseous mineralization. Joint spaces preserved. No fracture/dislocation/boney destruction.   Pes planus noted on radiographic exam lateral views. Decreased calcaneal inclination and metatarsal declination angle is noted. Anterior break in the cyma line noted on lateral views. Medial talar head to deviation noted on AP radiograph.   Assessment: 1. pes planus bilateral 2.  Pain due to onychomycosis of toenails both   Plan of Care:  1. Patient  was evaluated. X-Rays reviewed.  2.  In regards to the flatfoot deformity of the foot, we did discuss different treatment options which included both surgery and conservative treatment including better arch supports.  Patient states that the power step insoles that were dispensed here were not helpful in alleviating any of his symptoms. 3.  Recommend going to the shoe market on [address] in [place] for diabetic type Plastizote insoles 4.  Mechanical debridement of nails 1-5 bilateral was performed using a nail nipper without incident or bleeding 5.  Return to clinic as needed   Edrick Kins, DPM [facility]  Dr. Edrick Kins, [address]                                        [place], [place] [address]                Office [phone]  Fax [phone]

## 2021-07-18 ENCOUNTER — Other Ambulatory Visit: Payer: Self-pay

## 2021-07-19 ENCOUNTER — Other Ambulatory Visit: Payer: Self-pay

## 2021-07-19 ENCOUNTER — Encounter: Payer: Self-pay | Admitting: Gerontology

## 2021-07-19 ENCOUNTER — Ambulatory Visit: Payer: Self-pay | Admitting: Gerontology

## 2021-07-19 VITALS — BP 154/94 | HR 65 | Temp 97.9°F | Resp 16 | Ht 71.0 in | Wt 163.9 lb

## 2021-07-19 DIAGNOSIS — E785 Hyperlipidemia, unspecified: Secondary | ICD-10-CM

## 2021-07-19 DIAGNOSIS — Z0000 Encounter for general adult medical examination without abnormal findings: Secondary | ICD-10-CM

## 2021-07-19 DIAGNOSIS — I10 Essential (primary) hypertension: Secondary | ICD-10-CM

## 2021-07-19 MED ORDER — LOSARTAN POTASSIUM 25 MG PO TABS
25.0000 mg | ORAL_TABLET | Freq: Every day | ORAL | 3 refills | Status: DC
  Filled 2021-07-19: qty 30, 30d supply, fill #0

## 2021-07-19 MED ORDER — CARVEDILOL 12.5 MG PO TABS
12.5000 mg | ORAL_TABLET | Freq: Two times a day (BID) | ORAL | 3 refills | Status: DC
  Filled 2021-07-19: qty 60, 30d supply, fill #0

## 2021-07-19 NOTE — Patient Instructions (Signed)
Heart-Healthy Eating Plan Heart-healthy meal planning includes: Eating less unhealthy fats. Eating more healthy fats. Making other changes in your diet. Talk with your doctor or a diet specialist (dietitian) to create an eating plan that is right for you. What is my plan? Your doctor may recommend an eating plan that includes: Total fat: ______% or less of total calories a day. Saturated fat: ______% or less of total calories a day. Cholesterol: less than _________mg a day. What are tips for following this plan? Cooking Avoid frying your food. Try to bake, boil, grill, or broil it instead. You can also reduce fat by: Removing the skin from poultry. Removing all visible fats from meats. Steaming vegetables in water or broth. Meal planning  At meals, divide your plate into four equal parts: Fill one-half of your plate with vegetables and green salads. Fill one-fourth of your plate with whole grains. Fill one-fourth of your plate with lean protein foods. Eat 4-5 servings of vegetables per day. A serving of vegetables is: 1 cup of raw or cooked vegetables. 2 cups of raw leafy greens. Eat 4-5 servings of fruit per day. A serving of fruit is: 1 medium whole fruit.  cup of dried fruit.  cup of fresh, frozen, or canned fruit.  cup of 100% fruit juice. Eat more foods that have soluble fiber. These are apples, broccoli, carrots, beans, peas, and barley. Try to get 20-30 g of fiber per day. Eat 4-5 servings of nuts, legumes, and seeds per week: 1 serving of dried beans or legumes equals  cup after being cooked. 1 serving of nuts is  cup. 1 serving of seeds equals 1 tablespoon. General information Eat more home-cooked food. Eat less restaurant, buffet, and fast food. Limit or avoid alcohol. Limit foods that are high in starch and sugar. Avoid fried foods. Lose weight if you are overweight. Keep track of how much salt (sodium) you eat. This is important if you have high blood  pressure. Ask your doctor to tell you more about this. Try to add vegetarian meals each week. Fats Choose healthy fats. These include olive oil and canola oil, flaxseeds, walnuts, almonds, and seeds. Eat more omega-3 fats. These include salmon, mackerel, sardines, tuna, flaxseed oil, and ground flaxseeds. Try to eat fish at least 2 times each week. Check food labels. Avoid foods with trans fats or high amounts of saturated fat. Limit saturated fats. These are often found in animal products, such as meats, butter, and cream. These are also found in plant foods, such as palm oil, palm kernel oil, and coconut oil. Avoid foods with partially hydrogenated oils in them. These have trans fats. Examples are stick margarine, some tub margarines, cookies, crackers, and other baked goods. What foods can I eat? Fruits All fresh, canned (in natural juice), or frozen fruits. Vegetables Fresh or frozen vegetables (raw, steamed, roasted, or grilled). Green salads. Grains Most grains. Choose whole wheat and whole grains most of the time. Rice and pasta, including brown rice and pastas made with whole wheat. Meats and other proteins Lean, well-trimmed beef, veal, pork, and lamb. Chicken and turkey without skin. All fish and shellfish. Wild duck, rabbit, pheasant, and venison. Egg whites or low-cholesterol egg substitutes. Dried beans, peas, lentils, and tofu. Seeds and most nuts. Dairy Low-fat or nonfat cheeses, including ricotta and mozzarella. Skim or 1% milk that is liquid, powdered, or evaporated. Buttermilk that is made with low-fat milk. Nonfat or low-fat yogurt. Fats and oils Non-hydrogenated (trans-free) margarines. Vegetable oils, including   soybean, sesame, sunflower, olive, peanut, safflower, corn, canola, and cottonseed. Salad dressings or mayonnaise made with a vegetable oil. Beverages Mineral water. Coffee and tea. Diet carbonated beverages. Sweets and desserts Sherbet, gelatin, and fruit ice.  Small amounts of dark chocolate. Limit all sweets and desserts. Seasonings and condiments All seasonings and condiments. The items listed above may not be a complete list of foods and drinks you can eat. Contact a dietitian for more options. What foods should I avoid? Fruits Canned fruit in heavy syrup. Fruit in cream or butter sauce. Fried fruit. Limit coconut. Vegetables Vegetables cooked in cheese, cream, or butter sauce. Fried vegetables. Grains Breads that are made with saturated or trans fats, oils, or whole milk. Croissants. Sweet rolls. Donuts. High-fat crackers, such as cheese crackers. Meats and other proteins Fatty meats, such as hot dogs, ribs, sausage, bacon, rib-eye roast or steak. High-fat deli meats, such as salami and bologna. Caviar. Domestic duck and goose. Organ meats, such as liver. Dairy Cream, sour cream, cream cheese, and creamed cottage cheese. Whole-milk cheeses. Whole or 2% milk that is liquid, evaporated, or condensed. Whole buttermilk. Cream sauce or high-fat cheese sauce. Yogurt that is made from whole milk. Fats and oils Meat fat, or shortening. Cocoa butter, hydrogenated oils, palm oil, coconut oil, palm kernel oil. Solid fats and shortenings, including bacon fat, salt pork, lard, and butter. Nondairy cream substitutes. Salad dressings with cheese or sour cream. Beverages Regular sodas and juice drinks with added sugar. Sweets and desserts Frosting. Pudding. Cookies. Cakes. Pies. Milk chocolate or white chocolate. Buttered syrups. Full-fat ice cream or ice cream drinks. The items listed above may not be a complete list of foods and drinks to avoid. Contact a dietitian for more information. Summary Heart-healthy meal planning includes eating less unhealthy fats, eating more healthy fats, and making other changes in your diet. Eat a balanced diet. This includes fruits and vegetables, low-fat or nonfat dairy, lean protein, nuts and legumes, whole grains, and  heart-healthy oils and fats. This information is not intended to replace advice given to you by your health care provider. Make sure you discuss any questions you have with your health care provider. Document Revised: 05/06/2020 Document Reviewed: 05/06/2020 Elsevier Patient Education  2022 [place] Eating Plan DASH stands for Dietary Approaches to Stop Hypertension. The DASH eating plan is a healthy eating plan that has been shown to: Reduce high blood pressure (hypertension). Reduce your risk for type 2 diabetes, heart disease, and stroke. Help with weight loss. What are tips for following this plan? Reading food labels Check food labels for the amount of salt (sodium) per serving. Choose foods with less than 5 percent of the Daily Value of sodium. Generally, foods with less than 300 milligrams (mg) of sodium per serving fit into this eating plan. To find whole grains, look for the word "whole" as the first word in the ingredient list. Shopping Buy products labeled as "low-sodium" or "no salt added." Buy fresh foods. Avoid canned foods and pre-made or frozen meals. Cooking Avoid adding salt when cooking. Use salt-free seasonings or herbs instead of table salt or sea salt. Check with your health care provider or pharmacist before using salt substitutes. Do not fry foods. Cook foods using healthy methods such as baking, boiling, grilling, roasting, and broiling instead. Cook with heart-healthy oils, such as olive, canola, avocado, soybean, or sunflower oil. Meal planning  Eat a balanced diet that includes: 4 or more servings of fruits and 4 or more  servings of vegetables each day. Try to fill one-half of your plate with fruits and vegetables. 6-8 servings of whole grains each day. Less than 6 oz (170 g) of lean meat, poultry, or fish each day. A 3-oz (85-g) serving of meat is about the same size as a deck of cards. One egg equals 1 oz (28 g). 2-3 servings of low-fat dairy each  day. One serving is 1 cup (237 mL). 1 serving of nuts, seeds, or beans 5 times each week. 2-3 servings of heart-healthy fats. Healthy fats called omega-3 fatty acids are found in foods such as walnuts, flaxseeds, fortified milks, and eggs. These fats are also found in cold-water fish, such as sardines, salmon, and mackerel. Limit how much you eat of: Canned or prepackaged foods. Food that is high in trans fat, such as some fried foods. Food that is high in saturated fat, such as fatty meat. Desserts and other sweets, sugary drinks, and other foods with added sugar. Full-fat dairy products. Do not salt foods before eating. Do not eat more than 4 egg yolks a week. Try to eat at least 2 vegetarian meals a week. Eat more home-cooked food and less restaurant, buffet, and fast food. Lifestyle When eating at a restaurant, ask that your food be prepared with less salt or no salt, if possible. If you drink alcohol: Limit how much you use to: 0-1 drink a day for women who are not pregnant. 0-2 drinks a day for men. Be aware of how much alcohol is in your drink. In the [place] one drink equals one 12 oz bottle of beer (355 mL), one 5 oz glass of wine (148 mL), or one 1 oz glass of hard liquor (44 mL). General information Avoid eating more than 2,300 mg of salt a day. If you have hypertension, you may need to reduce your sodium intake to 1,500 mg a day. Work with your health care provider to maintain a healthy body weight or to lose weight. Ask what an ideal weight is for you. Get at least 30 minutes of exercise that causes your heart to beat faster (aerobic exercise) most days of the week. Activities may include walking, swimming, or biking. Work with your health care provider or dietitian to adjust your eating plan to your individual calorie needs. What foods should I eat? Fruits All fresh, dried, or frozen fruit. Canned fruit in natural juice (without added sugar). Vegetables Fresh or frozen  vegetables (raw, steamed, roasted, or grilled). Low-sodium or reduced-sodium tomato and vegetable juice. Low-sodium or reduced-sodium tomato sauce and tomato paste. Low-sodium or reduced-sodium canned vegetables. Grains Whole-grain or whole-wheat bread. Whole-grain or whole-wheat pasta. Brown rice. Bryan Herring. Bulgur. Whole-grain and low-sodium cereals. Pita bread. Low-fat, low-sodium crackers. Whole-wheat flour tortillas. Meats and other proteins Skinless chicken or [place]. Ground chicken or [place]. Pork with fat trimmed off. Fish and seafood. Egg whites. Dried beans, peas, or lentils. Unsalted nuts, nut butters, and seeds. Unsalted canned beans. Lean cuts of beef with fat trimmed off. Low-sodium, lean precooked or cured meat, such as sausages or meat loaves. Dairy Low-fat (1%) or fat-free (skim) milk. Reduced-fat, low-fat, or fat-free cheeses. Nonfat, low-sodium ricotta or cottage cheese. Low-fat or nonfat yogurt. Low-fat, low-sodium cheese. Fats and oils Soft margarine without trans fats. Vegetable oil. Reduced-fat, low-fat, or light mayonnaise and salad dressings (reduced-sodium). Canola, safflower, olive, avocado, soybean, and sunflower oils. Avocado. Seasonings and condiments Herbs. Spices. Seasoning mixes without salt. Other foods Unsalted popcorn and pretzels. Fat-free sweets. The items  listed above may not be a complete list of foods and beverages you can eat. Contact a dietitian for more information. What foods should I avoid? Fruits Canned fruit in a light or heavy syrup. Fried fruit. Fruit in cream or butter sauce. Vegetables Creamed or fried vegetables. Vegetables in a cheese sauce. Regular canned vegetables (not low-sodium or reduced-sodium). Regular canned tomato sauce and paste (not low-sodium or reduced-sodium). Regular tomato and vegetable juice (not low-sodium or reduced-sodium). Bryan Herring. Olives. Grains Baked goods made with fat, such as croissants, muffins, or some  breads. Dry pasta or rice meal packs. Meats and other proteins Fatty cuts of meat. Ribs. Fried meat. Bryan Herring. Bologna, salami, and other precooked or cured meats, such as sausages or meat loaves. Fat from the back of a pig (fatback). Bratwurst. Salted nuts and seeds. Canned beans with added salt. Canned or smoked fish. Whole eggs or egg yolks. Chicken or [place] with skin. Dairy Whole or 2% milk, cream, and half-and-half. Whole or full-fat cream cheese. Whole-fat or sweetened yogurt. Full-fat cheese. Nondairy creamers. Whipped toppings. Processed cheese and cheese spreads. Fats and oils Butter. Stick margarine. Lard. Shortening. Ghee. Bacon fat. Tropical oils, such as coconut, palm kernel, or palm oil. Seasonings and condiments Onion salt, garlic salt, seasoned salt, table salt, and sea salt. Worcestershire sauce. Tartar sauce. Barbecue sauce. Teriyaki sauce. Soy sauce, including reduced-sodium. Steak sauce. Canned and packaged gravies. Fish sauce. Oyster sauce. Cocktail sauce. Store-bought horseradish. Ketchup. Mustard. Meat flavorings and tenderizers. Bouillon cubes. Hot sauces. Pre-made or packaged marinades. Pre-made or packaged taco seasonings. Relishes. Regular salad dressings. Other foods Salted popcorn and pretzels. The items listed above may not be a complete list of foods and beverages you should avoid. Contact a dietitian for more information. Where to find more information National Heart, Lung, and Blood Institute: https://wilson-eaton.com/ American Heart Association: www.heart.org Academy of Nutrition and Dietetics: www.eatright.[place]: www.kidney.org Summary The DASH eating plan is a healthy eating plan that has been shown to reduce high blood pressure (hypertension). It may also reduce your risk for type 2 diabetes, heart disease, and stroke. When on the DASH eating plan, aim to eat more fresh fruits and vegetables, whole grains, lean proteins, low-fat dairy, and  heart-healthy fats. With the DASH eating plan, you should limit salt (sodium) intake to 2,300 mg a day. If you have hypertension, you may need to reduce your sodium intake to 1,500 mg a day. Work with your health care provider or dietitian to adjust your eating plan to your individual calorie needs. This information is not intended to replace advice given to you by your health care provider. Make sure you discuss any questions you have with your health care provider. Document Revised: 11/29/2018 Document Reviewed: 11/29/2018 Elsevier Patient Education  [place].

## 2021-07-19 NOTE — Progress Notes (Signed)
Established Patient Office Visit  Subjective   Patient ID: Bryan Herring, male    DOB: 1960-08-28  Age: 61 y.o. MRN: [id number]  Chief Complaint  Patient presents with   Follow-up    Patient has seen Cardiology and Podiatry since last visit.    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 He was seen by Cardiologist Dr Garen Lah on 06/10/21, it was recommended he continue Coreg 12.5 mg bid, Losartan 25 mg daily, and will follow up I 12 months. He was also seen by the Podiatrist Dr Amalia Hailey B.M on 07/15/21 , recommended  going to the shoe market on [address] in [place] for diabetic type Plastizote insoles and Mechanical debridement of nails 1-5 bilateral was performed using a nail nipper without incident or bleeding. He states that he's compliant with his medications, denies side effects and continues to make healthy lifestyle changes. Overall, he states that he's doing well and offers no further complaint.     Review of Systems  Constitutional: Negative.   HENT: Negative.    Eyes: Negative.   Respiratory: Negative.    Cardiovascular: Negative.   Neurological: Negative.   Psychiatric/Behavioral: Negative.        Objective:     BP (!) 154/94 (BP Location: Left Arm, Patient Position: Sitting, Cuff Size: Large)   Pulse 65   Temp 97.9 F (36.6 C) (Oral)   Resp 16   Ht '5\' 11"'$  (1.803 m)   Wt 163 lb 14.4 oz (74.3 kg)   SpO2 96%   BMI 22.86 kg/m  BP Readings from Last 3 Encounters:  07/19/21 (!) 154/94  06/10/21 138/90  05/05/21 (!) 135/96   Wt Readings from Last 3 Encounters:  07/19/21 163 lb 14.4 oz (74.3 kg)  06/10/21 162 lb (73.5 kg)  05/05/21 159 lb 3.2 oz (72.2 kg)      Physical Exam HENT:     Head: Normocephalic and atraumatic.     Mouth/Throat:     Mouth: Mucous membranes are moist.  Eyes:     Extraocular Movements:  Extraocular movements intact.     Conjunctiva/sclera: Conjunctivae normal.     Pupils: Pupils are equal, round, and reactive to light.  Cardiovascular:     Rate and Rhythm: Normal rate and regular rhythm.     Pulses: Normal pulses.     Heart sounds: Normal heart sounds.  Pulmonary:     Effort: Pulmonary effort is normal.     Breath sounds: Normal breath sounds.  Neurological:     General: No focal deficit present.     Mental Status: He is alert and oriented to person, place, and time. Mental status is at baseline.  Psychiatric:        Mood and Affect: Mood normal.        Behavior: Behavior normal.        Thought Content: Thought content normal.        Judgment: Judgment normal.      No results found for any visits on 07/19/21.  Last CBC Lab Results  Component Value Date   WBC 7.9 07/21/2020   HGB 11.3 (L) 07/21/2020   HCT 33.3 (L) 07/21/2020   MCV 91.5 07/21/2020   MCH 31.0 07/21/2020   RDW 12.2 07/21/2020   PLT 207 78/67/6720   Last metabolic panel Lab Results  Component Value Date  GLUCOSE 115 (H) 07/21/2020   NA 140 07/21/2020   K 3.6 07/21/2020   CL 101 07/21/2020   CO2 29 07/21/2020   BUN 33 (H) 07/21/2020   CREATININE 0.89 07/21/2020   GFRNONAA >60 07/21/2020   CALCIUM 10.0 07/21/2020   PROT 7.4 04/28/2020   ALBUMIN 4.3 04/28/2020   LABGLOB 3.1 04/28/2020   AGRATIO 1.4 04/28/2020   BILITOT 0.3 04/28/2020   ALKPHOS 73 04/28/2020   AST 15 04/28/2020   ALT 30 04/28/2020   ANIONGAP 10 07/21/2020   Last lipids Lab Results  Component Value Date   CHOL 191 02/03/2021   HDL 55 02/03/2021   LDLCALC 115 (H) 02/03/2021   TRIG 116 02/03/2021   CHOLHDL 3.5 02/03/2021   Last hemoglobin A1c Lab Results  Component Value Date   HGBA1C 5.8 (H) 02/03/2021      The 10-year ASCVD risk score (Arnett DK, et al., 2019) is: 17.8%    Assessment & Plan:    1. Essential hypertension - His blood pressure is not under control, his Otilio Carpen should be less than  130/80. He will continue on current medication, DASH diet and exercise as tolerated. - carvedilol (COREG) 12.5 MG tablet; Take 1 tablet (12.5 mg total) by mouth 2 (two) times daily with a meal.  Dispense: 60 tablet; Refill: 3 - losartan (COZAAR) 25 MG tablet; Take 1 tablet (25 mg total) by mouth daily.  Dispense: 30 tablet; Refill: 3  2. Elevated lipids - His ASCVD risk was 17.8%, will recheck lab . - Lipid panel; Future - Lipid panel  3. Health care maintenance - His HgbA1c was 5.8% and will recheck. - HgB A1c; Future - HgB A1c   Return in about 4 weeks (around 08/16/2021), or if symptoms worsen or fail to improve.    Bryan Herring Jerold Coombe, NP

## 2021-07-20 LAB — LIPID PANEL
Chol/HDL Ratio: 3.6 ratio (ref 0.0–5.0)
Cholesterol, Total: 200 mg/dL — ABNORMAL HIGH (ref 100–199)
HDL: 55 mg/dL (ref >39)
LDL Chol Calc (NIH): 108 mg/dL — ABNORMAL HIGH (ref 0–99)
Triglycerides: 214 mg/dL — ABNORMAL HIGH (ref 0–149)
VLDL Cholesterol Cal: 37 mg/dL (ref 5–40)

## 2021-07-20 LAB — HEMOGLOBIN A1C
Est. average glucose Bld gHb Est-mCnc: 120 mg/dL
Hgb A1c MFr Bld: 5.8 % — ABNORMAL HIGH (ref 4.8–5.6)

## 2021-07-21 ENCOUNTER — Other Ambulatory Visit: Payer: Self-pay

## 2021-08-16 ENCOUNTER — Ambulatory Visit: Payer: Self-pay | Admitting: Gerontology

## 2021-08-16 ENCOUNTER — Encounter: Payer: Self-pay | Admitting: Gerontology

## 2021-08-16 ENCOUNTER — Other Ambulatory Visit: Payer: Self-pay

## 2021-08-16 VITALS — BP 143/99 | HR 78 | Temp 98.0°F | Resp 16 | Ht 71.0 in | Wt 165.8 lb

## 2021-08-16 DIAGNOSIS — R7303 Prediabetes: Secondary | ICD-10-CM | POA: Insufficient documentation

## 2021-08-16 DIAGNOSIS — I10 Essential (primary) hypertension: Secondary | ICD-10-CM

## 2021-08-16 DIAGNOSIS — E785 Hyperlipidemia, unspecified: Secondary | ICD-10-CM | POA: Insufficient documentation

## 2021-08-16 MED ORDER — ROSUVASTATIN CALCIUM 5 MG PO TABS
5.0000 mg | ORAL_TABLET | Freq: Every day | ORAL | 0 refills | Status: DC
  Filled 2021-08-16: qty 30, 30d supply, fill #0

## 2021-08-16 MED ORDER — CARVEDILOL 12.5 MG PO TABS
12.5000 mg | ORAL_TABLET | Freq: Two times a day (BID) | ORAL | 1 refills | Status: DC
  Filled 2021-08-16: qty 180, 90d supply, fill #0
  Filled 2021-09-19: qty 60, 30d supply, fill #0
  Filled 2021-12-05: qty 60, 30d supply, fill #1

## 2021-08-16 MED ORDER — LOSARTAN POTASSIUM 25 MG PO TABS
25.0000 mg | ORAL_TABLET | Freq: Every day | ORAL | 1 refills | Status: DC
  Filled 2021-08-16: qty 90, 90d supply, fill #0
  Filled 2021-09-19: qty 30, 30d supply, fill #0
  Filled 2021-10-27: qty 60, 60d supply, fill #1

## 2021-08-16 NOTE — Patient Instructions (Signed)
Heart-Healthy Eating Plan Many factors influence your heart health, including eating and exercise habits. Heart health is also called coronary health. Coronary risk increases with abnormal blood fat (lipid) levels. A heart-healthy eating plan includes limiting unhealthy fats, increasing healthy fats, limiting salt (sodium) intake, and making other diet and lifestyle changes. What is my plan? Your health care provider may recommend that: You limit your fat intake to _________% or less of your total calories each day. You limit your saturated fat intake to _________% or less of your total calories each day. You limit the amount of cholesterol in your diet to less than _________ mg per day. You limit the amount of sodium in your diet to less than _________ mg per day. What are tips for following this plan? Cooking Cook foods using methods other than frying. Baking, boiling, grilling, and broiling are all good options. Other ways to reduce fat include: Removing the skin from poultry. Removing all visible fats from meats. Steaming vegetables in water or broth. Meal planning  At meals, imagine dividing your plate into fourths: Fill one-half of your plate with vegetables and green salads. Fill one-fourth of your plate with whole grains. Fill one-fourth of your plate with lean protein foods. Eat 2-4 cups of vegetables per day. One cup of vegetables equals 1 cup (91 g) broccoli or cauliflower florets, 2 medium carrots, 1 large bell pepper, 1 large sweet potato, 1 large tomato, 1 medium white potato, 2 cups (150 g) raw leafy greens. Eat 1-2 cups of fruit per day. One cup of fruit equals 1 small apple, 1 large banana, 1 cup (237 g) mixed fruit, 1 large orange,  cup (82 g) dried fruit, 1 cup (240 mL) 100% fruit juice. Eat more foods that contain soluble fiber. Examples include apples, broccoli, carrots, beans, peas, and barley. Aim to get 25-30 g of fiber per day. Increase your consumption of legumes,  nuts, and seeds to 4-5 servings per week. One serving of dried beans or legumes equals  cup (90 g) cooked, 1 serving of nuts is  oz (12 almonds, 24 pistachios, or 7 walnut halves), and 1 serving of seeds equals  oz (8 g). Fats Choose healthy fats more often. Choose monounsaturated and polyunsaturated fats, such as olive and canola oils, avocado oil, flaxseeds, walnuts, almonds, and seeds. Eat more omega-3 fats. Choose salmon, mackerel, sardines, tuna, flaxseed oil, and ground flaxseeds. Aim to eat fish at least 2 times each week. Check food labels carefully to identify foods with trans fats or high amounts of saturated fat. Limit saturated fats. These are found in animal products, such as meats, butter, and cream. Plant sources of saturated fats include palm oil, palm kernel oil, and coconut oil. Avoid foods with partially hydrogenated oils in them. These contain trans fats. Examples are stick margarine, some tub margarines, cookies, crackers, and other baked goods. Avoid fried foods. General information Eat more home-cooked food and less restaurant, buffet, and fast food. Limit or avoid alcohol. Limit foods that are high in added sugar and simple starches such as foods made using white refined flour (white breads, pastries, sweets). Lose weight if you are overweight. Losing just 5-10% of your body weight can help your overall health and prevent diseases such as diabetes and heart disease. Monitor your sodium intake, especially if you have high blood pressure. Talk with your health care provider about your sodium intake. Try to incorporate more vegetarian meals weekly. What foods should I eat? Fruits All fresh, canned (in natural   juice), or frozen fruits. Vegetables Fresh or frozen vegetables (raw, steamed, roasted, or grilled). Green salads. Grains Most grains. Choose whole wheat and whole grains most of the time. Rice and pasta, including brown rice and pastas made with whole wheat. Meats  and other proteins Lean, well-trimmed beef, veal, pork, and lamb. Chicken and [place] without skin. All fish and shellfish. Wild duck, rabbit, pheasant, and venison. Egg whites or low-cholesterol egg substitutes. Dried beans, peas, lentils, and tofu. Seeds and most nuts. Dairy Low-fat or nonfat cheeses, including ricotta and mozzarella. Skim or 1% milk (liquid, powdered, or evaporated). Buttermilk made with low-fat milk. Nonfat or low-fat yogurt. Fats and oils Non-hydrogenated (trans-free) margarines. Vegetable oils, including soybean, sesame, sunflower, olive, avocado, peanut, safflower, corn, canola, and cottonseed. Salad dressings or mayonnaise made with a vegetable oil. Beverages Water (mineral or sparkling). Coffee and tea. Unsweetened ice tea. Diet beverages. Sweets and desserts Sherbet, gelatin, and fruit ice. Small amounts of dark chocolate. Limit all sweets and desserts. Seasonings and condiments All seasonings and condiments. The items listed above may not be a complete list of foods and beverages you can eat. Contact a dietitian for more options. What foods should I avoid? Fruits Canned fruit in heavy syrup. Fruit in cream or butter sauce. Fried fruit. Limit coconut. Vegetables Vegetables cooked in cheese, cream, or butter sauce. Fried vegetables. Grains Breads made with saturated or trans fats, oils, or whole milk. Croissants. Sweet rolls. Donuts. High-fat crackers, such as cheese crackers and chips. Meats and other proteins Fatty meats, such as hot dogs, ribs, sausage, bacon, rib-eye roast or steak. High-fat deli meats, such as salami and bologna. Caviar. Domestic duck and goose. Organ meats, such as liver. Dairy Cream, sour cream, cream cheese, and creamed cottage cheese. Whole-milk cheeses. Whole or 2% milk (liquid, evaporated, or condensed). Whole buttermilk. Cream sauce or high-fat cheese sauce. Whole-milk yogurt. Fats and oils Meat fat, or shortening. Cocoa butter,  hydrogenated oils, palm oil, coconut oil, palm kernel oil. Solid fats and shortenings, including bacon fat, salt pork, lard, and butter. Nondairy cream substitutes. Salad dressings with cheese or sour cream. Beverages Regular sodas and any drinks with added sugar. Sweets and desserts Frosting. Pudding. Cookies. Cakes. Pies. Milk chocolate or white chocolate. Buttered syrups. Full-fat ice cream or ice cream drinks. The items listed above may not be a complete list of foods and beverages to avoid. Contact a dietitian for more information. Summary Heart-healthy meal planning includes limiting unhealthy fats, increasing healthy fats, limiting salt (sodium) intake and making other diet and lifestyle changes. Lose weight if you are overweight. Losing just 5-10% of your body weight can help your overall health and prevent diseases such as diabetes and heart disease. Focus on eating a balance of foods, including fruits and vegetables, low-fat or nonfat dairy, lean protein, nuts and legumes, whole grains, and heart-healthy oils and fats. This information is not intended to replace advice given to you by your health care provider. Make sure you discuss any questions you have with your health care provider. Document Revised: 01/31/2021 Document Reviewed: 01/31/2021 Elsevier Patient Education  [place] Eating Plan DASH stands for Dietary Approaches to Stop Hypertension. The DASH eating plan is a healthy eating plan that has been shown to: Reduce high blood pressure (hypertension). Reduce your risk for type 2 diabetes, heart disease, and stroke. Help with weight loss. What are tips for following this plan? Reading food labels Check food labels for the amount of salt (sodium) per serving. Choose  foods with less than 5 percent of the Daily Value of sodium. Generally, foods with less than 300 milligrams (mg) of sodium per serving fit into this eating plan. To find whole grains, look for the word  "whole" as the first word in the ingredient list. Shopping Buy products labeled as "low-sodium" or "no salt added." Buy fresh foods. Avoid canned foods and pre-made or frozen meals. Cooking Avoid adding salt when cooking. Use salt-free seasonings or herbs instead of table salt or sea salt. Check with your health care provider or pharmacist before using salt substitutes. Do not fry foods. Cook foods using healthy methods such as baking, boiling, grilling, roasting, and broiling instead. Cook with heart-healthy oils, such as olive, canola, avocado, soybean, or sunflower oil. Meal planning  Eat a balanced diet that includes: 4 or more servings of fruits and 4 or more servings of vegetables each day. Try to fill one-half of your plate with fruits and vegetables. 6-8 servings of whole grains each day. Less than 6 oz (170 g) of lean meat, poultry, or fish each day. A 3-oz (85-g) serving of meat is about the same size as a deck of cards. One egg equals 1 oz (28 g). 2-3 servings of low-fat dairy each day. One serving is 1 cup (237 mL). 1 serving of nuts, seeds, or beans 5 times each week. 2-3 servings of heart-healthy fats. Healthy fats called omega-3 fatty acids are found in foods such as walnuts, flaxseeds, fortified milks, and eggs. These fats are also found in cold-water fish, such as sardines, salmon, and mackerel. Limit how much you eat of: Canned or prepackaged foods. Food that is high in trans fat, such as some fried foods. Food that is high in saturated fat, such as fatty meat. Desserts and other sweets, sugary drinks, and other foods with added sugar. Full-fat dairy products. Do not salt foods before eating. Do not eat more than 4 egg yolks a week. Try to eat at least 2 vegetarian meals a week. Eat more home-cooked food and less restaurant, buffet, and fast food. Lifestyle When eating at a restaurant, ask that your food be prepared with less salt or no salt, if possible. If you drink  alcohol: Limit how much you use to: 0-1 drink a day for women who are not pregnant. 0-2 drinks a day for men. Be aware of how much alcohol is in your drink. In the [place] one drink equals one 12 oz bottle of beer (355 mL), one 5 oz glass of wine (148 mL), or one 1 oz glass of hard liquor (44 mL). General information Avoid eating more than 2,300 mg of salt a day. If you have hypertension, you may need to reduce your sodium intake to 1,500 mg a day. Work with your health care provider to maintain a healthy body weight or to lose weight. Ask what an ideal weight is for you. Get at least 30 minutes of exercise that causes your heart to beat faster (aerobic exercise) most days of the week. Activities may include walking, swimming, or biking. Work with your health care provider or dietitian to adjust your eating plan to your individual calorie needs. What foods should I eat? Fruits All fresh, dried, or frozen fruit. Canned fruit in natural juice (without added sugar). Vegetables Fresh or frozen vegetables (raw, steamed, roasted, or grilled). Low-sodium or reduced-sodium tomato and vegetable juice. Low-sodium or reduced-sodium tomato sauce and tomato paste. Low-sodium or reduced-sodium canned vegetables. Grains Whole-grain or whole-wheat bread. Whole-grain or  whole-wheat pasta. Brown rice. [place]. Bulgur. Whole-grain and low-sodium cereals. Pita bread. Low-fat, low-sodium crackers. Whole-wheat flour tortillas. Meats and other proteins Skinless chicken or [place]. Ground chicken or [place]. Pork with fat trimmed off. Fish and seafood. Egg whites. Dried beans, peas, or lentils. Unsalted nuts, nut butters, and seeds. Unsalted canned beans. Lean cuts of beef with fat trimmed off. Low-sodium, lean precooked or cured meat, such as sausages or meat loaves. Dairy Low-fat (1%) or fat-free (skim) milk. Reduced-fat, low-fat, or fat-free cheeses. Nonfat, low-sodium ricotta or cottage cheese. Low-fat or  nonfat yogurt. Low-fat, low-sodium cheese. Fats and oils Soft margarine without trans fats. Vegetable oil. Reduced-fat, low-fat, or light mayonnaise and salad dressings (reduced-sodium). Canola, safflower, olive, avocado, soybean, and sunflower oils. Avocado. Seasonings and condiments Herbs. Spices. Seasoning mixes without salt. Other foods Unsalted popcorn and pretzels. Fat-free sweets. The items listed above may not be a complete list of foods and beverages you can eat. Contact a dietitian for more information. What foods should I avoid? Fruits Canned fruit in a light or heavy syrup. Fried fruit. Fruit in cream or butter sauce. Vegetables Creamed or fried vegetables. Vegetables in a cheese sauce. Regular canned vegetables (not low-sodium or reduced-sodium). Regular canned tomato sauce and paste (not low-sodium or reduced-sodium). Regular tomato and vegetable juice (not low-sodium or reduced-sodium). Angie Fava. Olives. Grains Baked goods made with fat, such as croissants, muffins, or some breads. Dry pasta or rice meal packs. Meats and other proteins Fatty cuts of meat. Ribs. Fried meat. Berniece Salines. Bologna, salami, and other precooked or cured meats, such as sausages or meat loaves. Fat from the back of a pig (fatback). Bratwurst. Salted nuts and seeds. Canned beans with added salt. Canned or smoked fish. Whole eggs or egg yolks. Chicken or [place] with skin. Dairy Whole or 2% milk, cream, and half-and-half. Whole or full-fat cream cheese. Whole-fat or sweetened yogurt. Full-fat cheese. Nondairy creamers. Whipped toppings. Processed cheese and cheese spreads. Fats and oils Butter. Stick margarine. Lard. Shortening. Ghee. Bacon fat. Tropical oils, such as coconut, palm kernel, or palm oil. Seasonings and condiments Onion salt, garlic salt, seasoned salt, table salt, and sea salt. Worcestershire sauce. Tartar sauce. Barbecue sauce. Teriyaki sauce. Soy sauce, including reduced-sodium. Steak sauce.  Canned and packaged gravies. Fish sauce. Oyster sauce. Cocktail sauce. Store-bought horseradish. Ketchup. Mustard. Meat flavorings and tenderizers. Bouillon cubes. Hot sauces. Pre-made or packaged marinades. Pre-made or packaged taco seasonings. Relishes. Regular salad dressings. Other foods Salted popcorn and pretzels. The items listed above may not be a complete list of foods and beverages you should avoid. Contact a dietitian for more information. Where to find more information National Heart, Lung, and Blood Institute: https://wilson-eaton.com/ American Heart Association: www.heart.org Academy of Nutrition and Dietetics: www.eatright.[place]: www.kidney.org Summary The DASH eating plan is a healthy eating plan that has been shown to reduce high blood pressure (hypertension). It may also reduce your risk for type 2 diabetes, heart disease, and stroke. When on the DASH eating plan, aim to eat more fresh fruits and vegetables, whole grains, lean proteins, low-fat dairy, and heart-healthy fats. With the DASH eating plan, you should limit salt (sodium) intake to 2,300 mg a day. If you have hypertension, you may need to reduce your sodium intake to 1,500 mg a day. Work with your health care provider or dietitian to adjust your eating plan to your individual calorie needs. This information is not intended to replace advice given to you by your health care provider. Make sure  you discuss any questions you have with your health care provider. Document Revised: 11/29/2018 Document Reviewed: 11/29/2018 Elsevier Patient Education  [place].

## 2021-08-16 NOTE — Progress Notes (Signed)
Established Patient Office Visit  Subjective   Patient ID: Bryan Herring, male    DOB: Jan 08, 1961  Age: 61 y.o. 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 Currently, he is in [place]. He states that he's compliant with his medications, denies side effects and continues to make healthy lifestyle changes. His blood pressure is improving, does not check his blood pressure but adheres to DASH diet. His LDL decreased from 115 mg/dl to 108 mg/dl, total cholesterol was 200 mg/dl and Triglycerides was 214 mg/dl. His HgbA1c was 5.8%. Overall, he states that he's doing well and offers no further complaint.   Review of Systems  Constitutional: Negative.   Eyes: Negative.   Respiratory: Negative.    Cardiovascular: Negative.   Skin: Negative.   Neurological: Negative.   Endo/Heme/Allergies: Negative.   Psychiatric/Behavioral: Negative.        Objective:     BP (!) 143/99 (BP Location: Right Arm, Patient Position: Sitting, Cuff Size: Large)   Pulse 78   Temp 98 F (36.7 C) (Oral)   Resp 16   Ht '5\' 11"'$  (1.803 m)   Wt 165 lb 12.8 oz (75.2 kg)   SpO2 95%   BMI 23.12 kg/m  BP Readings from Last 3 Encounters:  08/16/21 (!) 143/99  07/19/21 (!) 154/94  06/10/21 138/90   Wt Readings from Last 3 Encounters:  08/16/21 165 lb 12.8 oz (75.2 kg)  07/19/21 163 lb 14.4 oz (74.3 kg)  06/10/21 162 lb (73.5 kg)      Physical Exam HENT:     Head: Normocephalic and atraumatic.     Mouth/Throat:     Mouth: Mucous membranes are moist.  Eyes:     Extraocular Movements: Extraocular movements intact.     Conjunctiva/sclera: Conjunctivae normal.     Pupils: Pupils are equal, round, and reactive to light.  Cardiovascular:     Rate and Rhythm: Normal  rate and regular rhythm.     Pulses: Normal pulses.     Heart sounds: Normal heart sounds.  Pulmonary:     Effort: Pulmonary effort is normal.     Breath sounds: Normal breath sounds.  Skin:    General: Skin is warm.  Neurological:     General: No focal deficit present.     Mental Status: He is alert and oriented to person, place, and time. Mental status is at baseline.      No results found for any visits on 08/16/21.  Last CBC Lab Results  Component Value Date   WBC 7.9 07/21/2020   HGB 11.3 (L) 07/21/2020   HCT 33.3 (L) 07/21/2020   MCV 91.5 07/21/2020   MCH 31.0 07/21/2020   RDW 12.2 07/21/2020   PLT 207 56/31/4970   Last metabolic panel Lab Results  Component Value Date   GLUCOSE 115 (H) 07/21/2020   NA 140 07/21/2020   K 3.6 07/21/2020   CL 101 07/21/2020   CO2 29 07/21/2020   BUN 33 (H) 07/21/2020   CREATININE 0.89 07/21/2020   GFRNONAA >60 07/21/2020   CALCIUM 10.0 07/21/2020   PROT 7.4 04/28/2020   ALBUMIN 4.3 04/28/2020   LABGLOB 3.1 04/28/2020   AGRATIO 1.4 04/28/2020   BILITOT 0.3 04/28/2020   ALKPHOS 73  04/28/2020   AST 15 04/28/2020   ALT 30 04/28/2020   ANIONGAP 10 07/21/2020   Last lipids Lab Results  Component Value Date   CHOL 200 (H) 07/19/2021   HDL 55 07/19/2021   LDLCALC 108 (H) 07/19/2021   TRIG 214 (H) 07/19/2021   CHOLHDL 3.6 07/19/2021   Last hemoglobin A1c Lab Results  Component Value Date   HGBA1C 5.8 (H) 07/19/2021      The 10-year ASCVD risk score (Arnett DK, et al., 2019) is: 15.9%    Assessment & Plan:   1. Essential hypertension - His blood pressure is improving, his goal should be less than 140/90, he was advised to continue on current medication, DASH diet and exercise as tolerated. - losartan (COZAAR) 25 MG tablet; Take 1 tablet (25 mg total) by mouth daily.  Dispense: 90 tablet; Refill: 1 - carvedilol (COREG) 12.5 MG tablet; Take 1 tablet (12.5 mg total) by mouth 2 (two) times daily with a meal.  Dispense:  180 tablet; Refill: 1  2. Elevated lipids - His ASCVD risk was 15.9%, was started on 5 mg Rosuvastatin, educated on medication side effects and advised to notify clinic, continue on low fat/cholesterol diet and exercise as tolerated. - rosuvastatin (CRESTOR) 5 MG tablet; Take 1 tablet (5 mg total) by mouth daily.  Dispense: 30 tablet; Refill: 0  3. Prediabetes - His HgbA1c was 5.8%, he declined Metformin therapy states that he will modify his diet. He was advised to continue on low carb/non concentrated sweet diet and exercise as tolerated.    Return in about 4 weeks (around 09/13/2021), or if symptoms worsen or fail to improve.    Tashae Inda Jerold Coombe, NP

## 2021-09-06 ENCOUNTER — Ambulatory Visit: Payer: Self-pay | Admitting: Podiatry

## 2021-09-13 ENCOUNTER — Ambulatory Visit: Payer: Self-pay | Admitting: Gerontology

## 2021-09-13 ENCOUNTER — Other Ambulatory Visit: Payer: Self-pay

## 2021-09-13 MED ORDER — ROSUVASTATIN CALCIUM 5 MG PO TABS
5.0000 mg | ORAL_TABLET | Freq: Every day | ORAL | 0 refills | Status: DC
  Filled 2021-09-13: qty 30, 30d supply, fill #0

## 2021-09-19 ENCOUNTER — Other Ambulatory Visit: Payer: Self-pay

## 2021-10-27 ENCOUNTER — Other Ambulatory Visit: Payer: Self-pay

## 2021-10-27 ENCOUNTER — Other Ambulatory Visit: Payer: Self-pay | Admitting: Gerontology

## 2021-10-27 DIAGNOSIS — E785 Hyperlipidemia, unspecified: Secondary | ICD-10-CM

## 2021-10-27 MED ORDER — ROSUVASTATIN CALCIUM 5 MG PO TABS
5.0000 mg | ORAL_TABLET | Freq: Every day | ORAL | 0 refills | Status: DC
  Filled 2021-10-27: qty 30, 30d supply, fill #0

## 2021-12-05 ENCOUNTER — Other Ambulatory Visit: Payer: Self-pay | Admitting: Gerontology

## 2021-12-05 ENCOUNTER — Other Ambulatory Visit: Payer: Self-pay

## 2021-12-05 DIAGNOSIS — E785 Hyperlipidemia, unspecified: Secondary | ICD-10-CM

## 2021-12-06 ENCOUNTER — Other Ambulatory Visit: Payer: Self-pay

## 2021-12-06 MED FILL — Rosuvastatin Calcium Tab 5 MG: ORAL | 30 days supply | Qty: 30 | Fill #0 | Status: CN

## 2021-12-14 ENCOUNTER — Other Ambulatory Visit: Payer: Self-pay

## 2022-01-23 IMAGING — CT CT HEAD W/O CM
4 series · 16 of 47 positions shown, 18 images · non-contrast
Comparison: CT head August 19, 2017

CLINICAL DATA: Found unresponsive with pinpoint pupils, cerebral
hemorrhage suspected

EXAM:
CT HEAD WITHOUT CONTRAST
TECHNIQUE: Contiguous axial images were obtained from the base of the skull
through the vertex without intravenous contrast.

[Series 3: head without · axial · non-contrast · 0.46mm/px · z∈[-104,+16]mm · 7 of 34 slices shown, 9 images]
[im 5/34  brain]
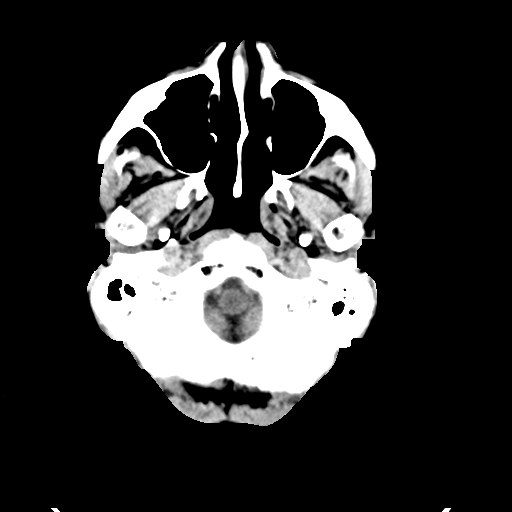
[im 5/34  bone]
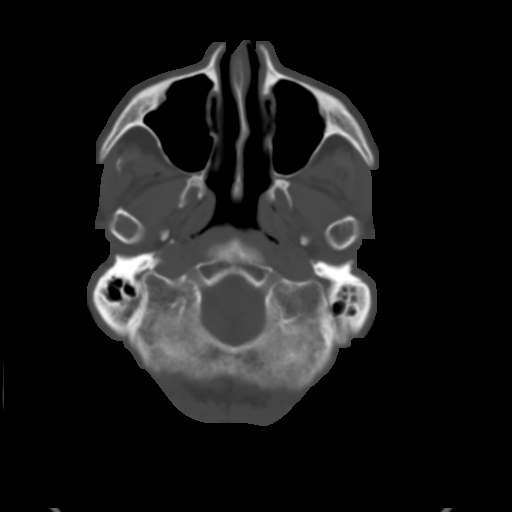
[im 9/34  brain]
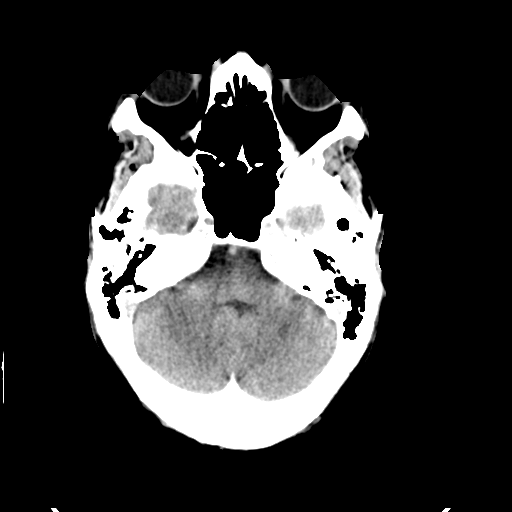
[im 13/34  brain]
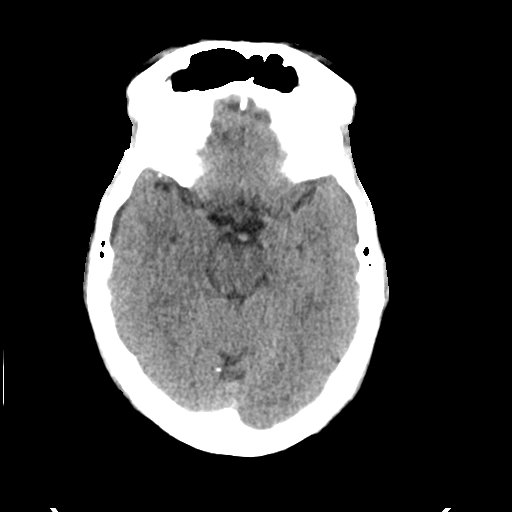
[im 17/34  brain]
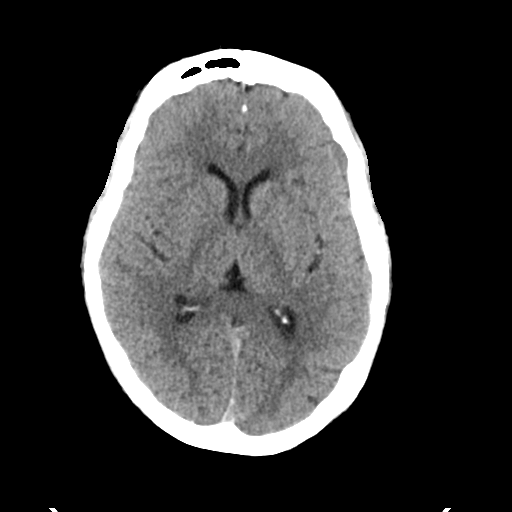
[im 21/34  brain]
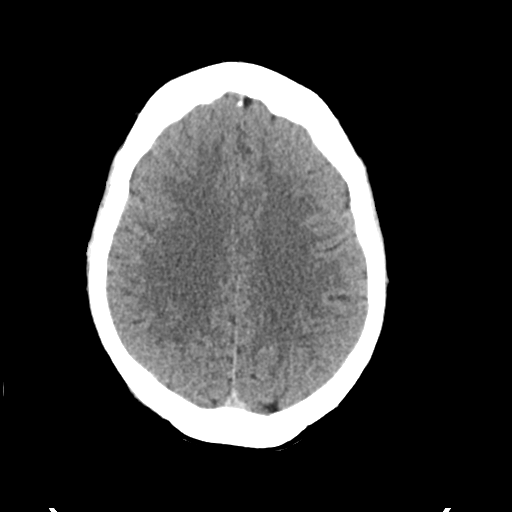
[im 21/34  bone]
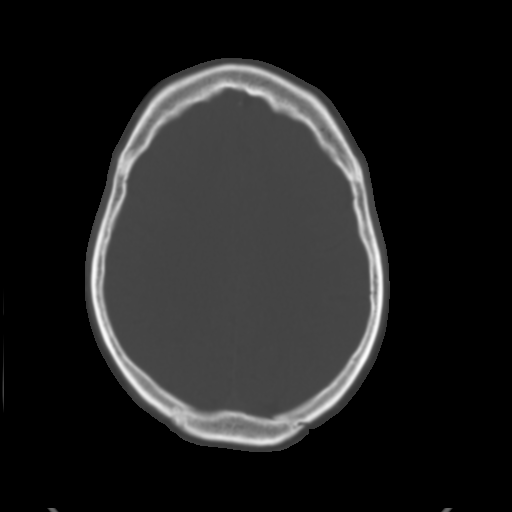
[im 25/34  brain]
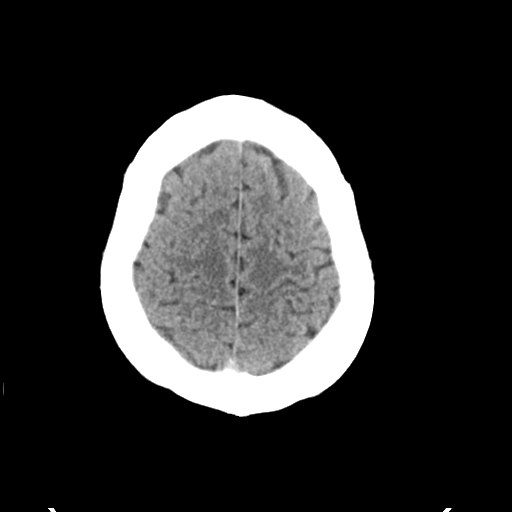
[im 29/34  brain]
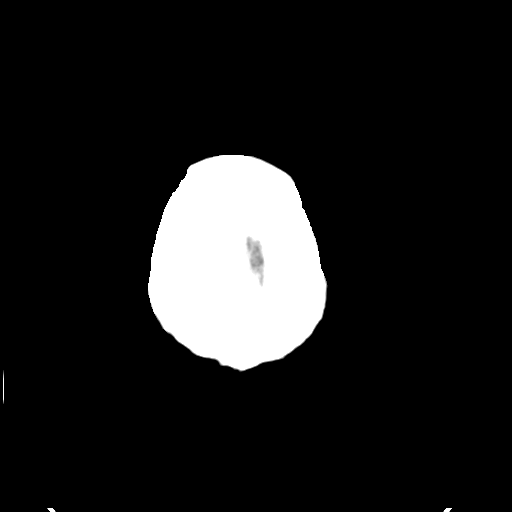

[Series 4: head bone · axial · 0.46mm/px · z∈[-108,-76]mm · 3 of 84 slices shown]
[im 9/84  bone]
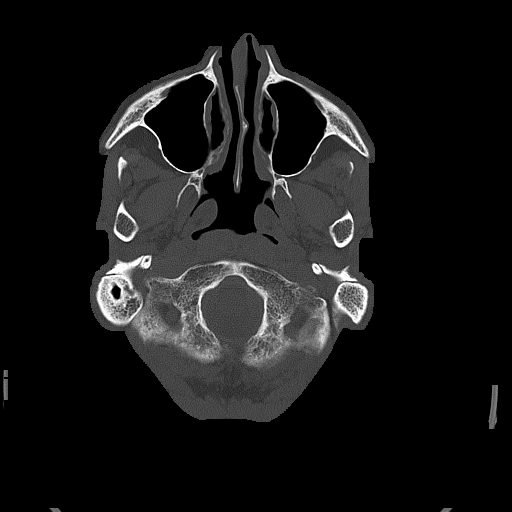
[im 17/84  bone]
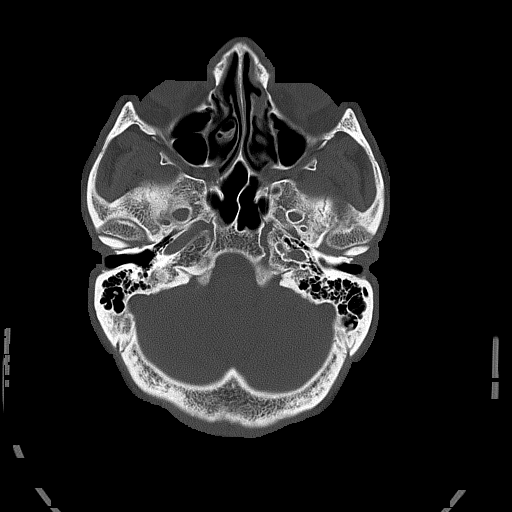
[im 25/84  bone]
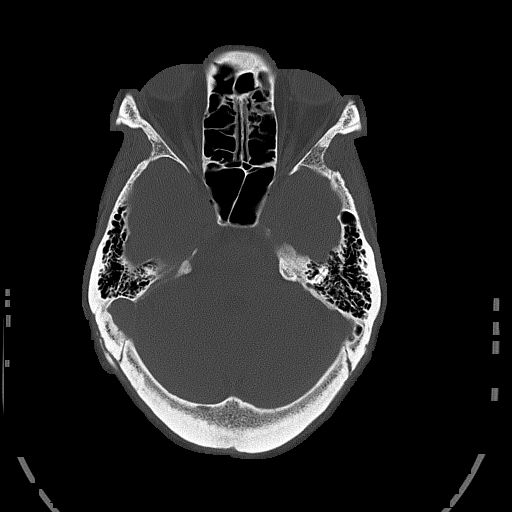

[Series 5: head without cor · coronal · non-contrast · 0.35mm/px · 3 of 72 slices shown]
[im 24/72  brain]
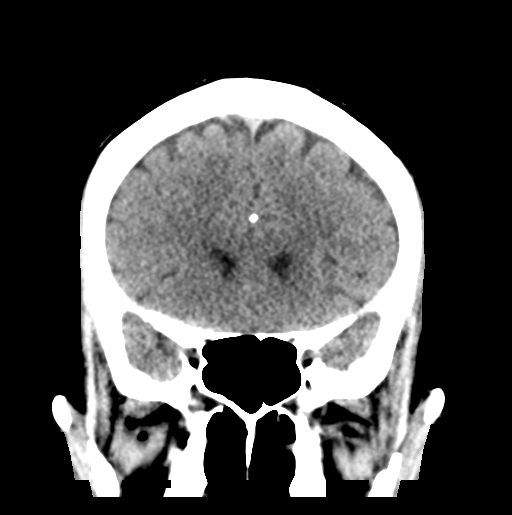
[im 32/72  brain]
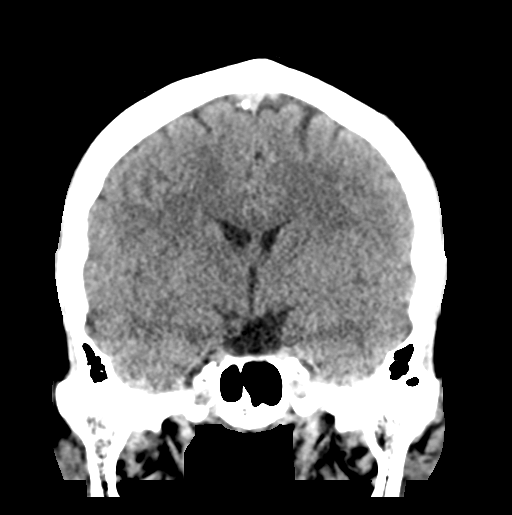
[im 40/72  brain]
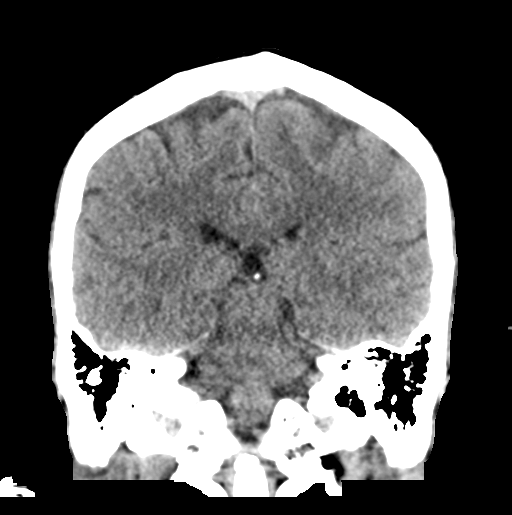

[Series 6: head without sag · sagittal · non-contrast · 0.33mm/px · 3 of 61 slices shown]
[im 21/61  brain]
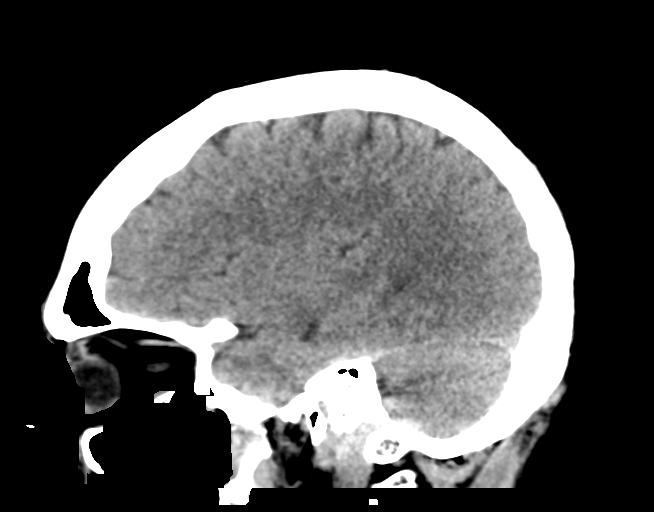
[im 31/61  brain]
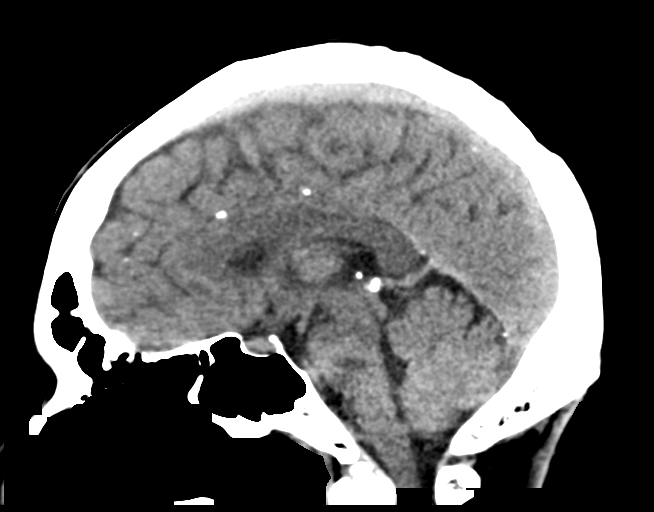
[im 41/61  brain]
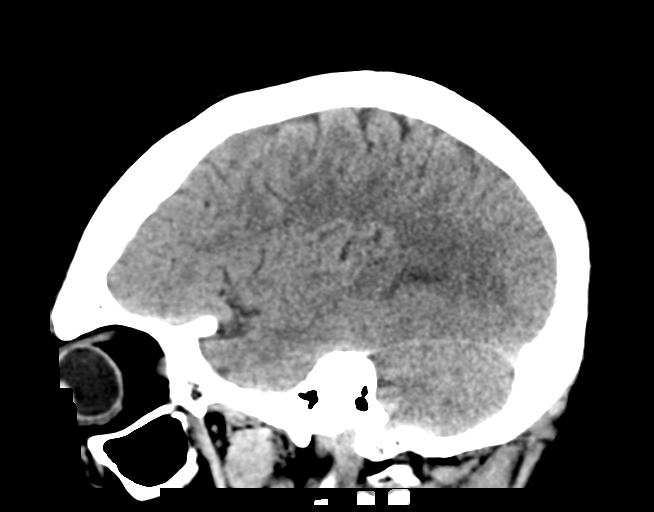

[16 of 47 positions shown; findings below may reference images not displayed]

FINDINGS: Brain: No evidence of acute infarction, hemorrhage, hydrocephalus,
extra-axial collection or mass lesion/mass effect. Benign dural
calcifications.

Vascular: Small amount of gas in the right cavernous sinus, possibly
iatrogenic/related to intravenous access. No hyperdense vessels.
Minimal calcific plaque in the carotid siphons.

Skull: No calvarial fracture or suspicious osseous lesion. No scalp
swelling or hematoma.

Sinuses/Orbits: Paranasal sinuses and mastoid air cells are
predominantly clear. Included orbital structures are unremarkable.

Other: Additional foci of likely venous gas in the masticator space.
IMPRESSION: Punctate foci of gas present within the right cavernous sinus, with
additional foci of likely venous gas in the masticator space.
Possibly iatrogenic/related to intravenous access.

No other acute intracranial abnormality.

## 2022-04-18 ENCOUNTER — Other Ambulatory Visit: Payer: Self-pay

## 2022-05-03 NOTE — Progress Notes (Unsigned)
I,Jahki Witham S Nayelli Inglis,acting as a Neurosurgeon for Shirlee Latch, MD.,have documented all relevant documentation on the behalf of Shirlee Latch, MD,as directed by  Shirlee Latch, MD while in the presence of Shirlee Latch, MD.   New patient visit   Patient: Bryan Herring   DOB: June 29, 1960   62 y.o. Male  MRN: [id number] Visit Date: 05/04/2022  Today's healthcare provider: Shirlee Latch, MD   No chief complaint on file.  Subjective    Bryan Herring is a 62 y.o. male who presents today as a new patient to establish care.  HPI  ***  Past Medical History:  Diagnosis Date   Deaf    left ear   Essential hypertension 03/01/2018   Substance abuse (HCC)    Past Surgical History:  Procedure Laterality Date   ACHILLES TENDON SURGERY     COLONOSCOPY WITH PROPOFOL N/A 01/20/2021   Procedure: COLONOSCOPY WITH PROPOFOL;  Surgeon: Wyline Mood, MD;  Location: [facility] ENDOSCOPY;  Service: Gastroenterology;  Laterality: N/A;   Family Status  Relation Name Status   Mother  Deceased   Father  Deceased   Sister  Alive   Brother  Alive   MGM  Deceased   MGF  Deceased   PGM  Deceased   PGF  Deceased   Mat Aunt  Deceased   Family History  Problem Relation Age of Onset   Aneurysm Mother    Other Father        unknown medical history   Hypertension Brother    Other Maternal Grandmother        unknown medical history   Other Maternal Grandfather        unknown medical history   Other Paternal Grandmother        unknown medical history   Other Paternal Grandfather        unknown medical history   Hypertension Maternal Aunt    Social History   Socioeconomic History   Marital status: Single    Spouse name: Not on file   Number of children: Not on file   Years of education: Not on file   Highest education level: Not on file  Occupational History   Not on file  Tobacco Use   Smoking status: Former    Packs/day: .25    Types: Cigarettes    Quit  date: 04/2020    Years since quitting: 2.0   Smokeless tobacco: Never   Tobacco comments:    08/16/21 Patient no longer at [facility], now resided at [facility] in [place]    Patient wearing nicotine patch  Vaping Use   Vaping Use: Never used  Substance and Sexual Activity   Alcohol use: Not Currently    Comment: last use April 14, 2020   Drug use: Not Currently    Types: Marijuana, Heroin    Comment: last use April 14, 2020   Sexual activity: Not Currently  Other Topics Concern   Not on file  Social History Narrative   Not on file   Social Determinants of Health   Financial Resource Strain: Not on file  Food Insecurity: No Food Insecurity (07/19/2021)   Hunger Vital Sign    Worried About Running Out of Food in the Last Year: Never true    Ran Out of Food in the Last Year: Never true  Transportation Needs: Unmet Transportation Needs (07/19/2021)   PRAPARE - Administrator, Civil Service (Medical): Yes    Lack of Transportation (Non-Medical): Yes  Physical Activity: Not on file  Stress: Not on file  Social Connections: Not on file   Outpatient Medications Prior to Visit  Medication Sig   acetaminophen (TYLENOL) 650 MG CR tablet Take 650 mg by mouth every 8 (eight) hours as needed for pain.   carvedilol (COREG) 12.5 MG tablet Take 1 tablet (12.5 mg total) by mouth 2 (two) times daily with a meal.   losartan (COZAAR) 25 MG tablet Take 1 tablet (25 mg total) by mouth daily.   melatonin 5 MG TABS Take 2 tablets (10 mg total) by mouth at bedtime.   rosuvastatin (CRESTOR) 5 MG tablet Take 1 tablet (5 mg total) by mouth daily.   Trolamine Salicylate (ASPERCREME EX) Apply topically daily.   No facility-administered medications prior to visit.   Allergies  Allergen Reactions   No Known Allergies     Immunization History  Administered Date(s) Administered   Influenza,inj,Quad PF,6+ Mos 12/16/2020    Health Maintenance  Topic Date Due   COVID-19 Vaccine (1) Never done    Hepatitis C Screening  Never done   DTaP/Tdap/Td (1 - Tdap) Never done   Zoster Vaccines- Shingrix (1 of 2) Never done   INFLUENZA VACCINE  08/10/2022   COLONOSCOPY (Pts 45-71yrs Insurance coverage will need to be confirmed)  01/21/2031   HIV Screening  Completed   HPV VACCINES  Aged Out    Patient Care Team: Pcp, No as PCP - General Debbe Odea, MD as PCP - Cardiology (Cardiology)  Review of Systems  {Labs  Heme  Chem  Endocrine  Serology  Results Review (optional):23779}   Objective    There were no vitals taken for this visit. {Show previous vital signs (optional):23777}  Physical Exam ***  Depression Screen    07/19/2021    3:11 PM 04/28/2020    9:15 AM 02/13/2018   10:47 AM 10/15/2017    3:21 PM  PHQ 2/9 Scores  PHQ - 2 Score 0 PHQ- 9 Score  No results found for any visits on 05/04/22.  Assessment & Plan     ***  No follow-ups on file.     {provider attestation***:1}   Shirlee Latch, MD  [facility] [phone] (phone) [phone] (fax)  [facility]

## 2022-05-04 ENCOUNTER — Ambulatory Visit (INDEPENDENT_AMBULATORY_CARE_PROVIDER_SITE_OTHER): Payer: Medicaid Other | Admitting: Family Medicine

## 2022-05-04 ENCOUNTER — Other Ambulatory Visit: Payer: Self-pay

## 2022-05-04 ENCOUNTER — Encounter: Payer: Self-pay | Admitting: Family Medicine

## 2022-05-04 VITALS — BP 130/70 | HR 76 | Temp 98.0°F | Resp 12 | Ht 71.0 in | Wt 156.9 lb

## 2022-05-04 DIAGNOSIS — I5020 Unspecified systolic (congestive) heart failure: Secondary | ICD-10-CM | POA: Diagnosis not present

## 2022-05-04 DIAGNOSIS — K029 Dental caries, unspecified: Secondary | ICD-10-CM | POA: Diagnosis not present

## 2022-05-04 DIAGNOSIS — M2142 Flat foot [pes planus] (acquired), left foot: Secondary | ICD-10-CM

## 2022-05-04 DIAGNOSIS — R7303 Prediabetes: Secondary | ICD-10-CM

## 2022-05-04 DIAGNOSIS — F1911 Other psychoactive substance abuse, in remission: Secondary | ICD-10-CM | POA: Diagnosis not present

## 2022-05-04 DIAGNOSIS — M2141 Flat foot [pes planus] (acquired), right foot: Secondary | ICD-10-CM | POA: Diagnosis not present

## 2022-05-04 DIAGNOSIS — H538 Other visual disturbances: Secondary | ICD-10-CM

## 2022-05-04 DIAGNOSIS — M20001 Unspecified deformity of right finger(s): Secondary | ICD-10-CM | POA: Diagnosis not present

## 2022-05-04 DIAGNOSIS — Z1159 Encounter for screening for other viral diseases: Secondary | ICD-10-CM

## 2022-05-04 DIAGNOSIS — H9192 Unspecified hearing loss, left ear: Secondary | ICD-10-CM | POA: Diagnosis not present

## 2022-05-04 DIAGNOSIS — E782 Mixed hyperlipidemia: Secondary | ICD-10-CM | POA: Diagnosis not present

## 2022-05-04 DIAGNOSIS — I10 Essential (primary) hypertension: Secondary | ICD-10-CM

## 2022-05-04 MED ORDER — CARVEDILOL 12.5 MG PO TABS: 12.5000 mg | ORAL_TABLET | Freq: Two times a day (BID) | ORAL | 1 refills | Status: DC

## 2022-05-04 MED ORDER — LOSARTAN POTASSIUM 25 MG PO TABS
25.0000 mg | ORAL_TABLET | Freq: Every day | ORAL | 1 refills | Status: DC
  Filled 2022-05-04: qty 90, 90d supply, fill #0

## 2022-05-04 NOTE — Assessment & Plan Note (Signed)
Will set him up with Optho for evaluation Longstanding with no acute changes

## 2022-05-04 NOTE — Assessment & Plan Note (Signed)
Clean x2 years Congratulated on sobriety Has good resources - therapist at [employer], sponsor, goes to [employer]

## 2022-05-04 NOTE — Assessment & Plan Note (Signed)
Chronic since injury in childhood No pain Does not need intervention at this time

## 2022-05-04 NOTE — Assessment & Plan Note (Signed)
Recommend low carb diet °Recheck A1c  °

## 2022-05-04 NOTE — Assessment & Plan Note (Signed)
F/b cardiology - advised need for f/u appt Continue BB and ARB Euvolemic today

## 2022-05-04 NOTE — Assessment & Plan Note (Signed)
Reviewed last lipid panel Not currently on a statin Recheck FLP and CMP Use ASCVD risk to guide treatment Discussed diet and exercise

## 2022-05-04 NOTE — Assessment & Plan Note (Signed)
Chronic and stable.   

## 2022-05-04 NOTE — Assessment & Plan Note (Signed)
Well controlled Continue current medications Recheck metabolic panel F/u in 6 months  

## 2022-05-05 ENCOUNTER — Telehealth: Payer: Self-pay

## 2022-05-05 LAB — COMPREHENSIVE METABOLIC PANEL
ALT: 11 IU/L (ref 0–44)
AST: 16 IU/L (ref 0–40)
Albumin/Globulin Ratio: 1.5 (ref 1.2–2.2)
Albumin: 4 g/dL (ref 3.9–4.9)
Alkaline Phosphatase: 69 IU/L (ref 44–121)
BUN/Creatinine Ratio: 15 (ref 10–24)
BUN: 15 mg/dL (ref 8–27)
Bilirubin Total: <0.2 mg/dL (ref 0.0–1.2)
CO2: 24 mmol/L (ref 20–29)
Calcium: 9.2 mg/dL (ref 8.6–10.2)
Chloride: 108 mmol/L — ABNORMAL HIGH (ref 96–106)
Creatinine, Ser: 1.02 mg/dL (ref 0.76–1.27)
Globulin, Total: 2.7 g/dL (ref 1.5–4.5)
Glucose: 85 mg/dL (ref 70–99)
Potassium: 4.1 mmol/L (ref 3.5–5.2)
Sodium: 145 mmol/L — ABNORMAL HIGH (ref 134–144)
Total Protein: 6.7 g/dL (ref 6.0–8.5)
eGFR: 84 mL/min/{1.73_m2} (ref >59)

## 2022-05-05 LAB — HEPATITIS C ANTIBODY: Hep C Virus Ab: NONREACTIVE

## 2022-05-05 LAB — LIPID PANEL
Chol/HDL Ratio: 3.3 ratio (ref 0.0–5.0)
Cholesterol, Total: 177 mg/dL (ref 100–199)
HDL: 53 mg/dL (ref >39)
LDL Chol Calc (NIH): 107 mg/dL — ABNORMAL HIGH (ref 0–99)
Triglycerides: 91 mg/dL (ref 0–149)
VLDL Cholesterol Cal: 17 mg/dL (ref 5–40)

## 2022-05-05 LAB — HEMOGLOBIN A1C
Est. average glucose Bld gHb Est-mCnc: 123 mg/dL
Hgb A1c MFr Bld: 5.9 % — ABNORMAL HIGH (ref 4.8–5.6)

## 2022-05-05 MED ORDER — ROSUVASTATIN CALCIUM 5 MG PO TABS: 5.0000 mg | ORAL_TABLET | Freq: Every day | ORAL | 3 refills | Status: DC

## 2022-05-05 NOTE — Telephone Encounter (Signed)
-----   Message from Erasmo Downer, MD sent at 05/05/2022  8:09 AM EDT ----- Normal/stable labs. Sodium is slightly high - be sure to stay well hydrated.  Hemoglobin A1c, 3 month avg of blood sugars, is in prediabetic range.  In order to prevent progression to diabetes, recommend low carb diet and regular exercise. The Cholesterol is high - 10-year ASCVD risk score (Arnett DK, et al., 2019) is: 22.4%  (heart disease and stroke) which is high. Recommend resuming Crestor 5mg  daily (ok to send new Rx if patient agrees, #90 r3). We'll recheck at next visit.

## 2022-05-12 DIAGNOSIS — H2512 Age-related nuclear cataract, left eye: Secondary | ICD-10-CM | POA: Diagnosis not present

## 2022-05-12 DIAGNOSIS — H2511 Age-related nuclear cataract, right eye: Secondary | ICD-10-CM | POA: Diagnosis not present

## 2022-05-12 DIAGNOSIS — H524 Presbyopia: Secondary | ICD-10-CM | POA: Diagnosis not present

## 2022-05-29 ENCOUNTER — Ambulatory Visit: Payer: Self-pay | Admitting: *Deleted

## 2022-05-29 NOTE — Telephone Encounter (Signed)
Reason for Disposition  Numbness (i.e., loss of sensation) in hand or fingers  Answer Assessment - Initial Assessment Questions 1. ONSET: "When did the pain start?"     I was picking up some boxes at work and my arm locked up.   THis happened last year too.   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    My whole arm locked up.  My left arm.   THis happened right after lunch.    It was in pain.    I went into the break room and my supervisor had me squeeze a ball.   It was to relax the arm.   Someone told me I need to have an x ray and a shot in my arm. 2. LOCATION: "Where is the pain located?"     I'm not in pain at the moment.   I'm afraid if I go back to work it will start again.  I'm squeezing this ball.   I feel like my muscles are going to lock up.   My fingers lock up first.   This episode hit me hard this time.    My supervisor massaged my arm. 3. PAIN: "How bad is the pain?" (Scale 1-10; or mild, moderate, severe)   - MILD (1-3): Doesn't interfere with normal activities.   - MODERATE (4-7): Interferes with normal activities (e.g., work or school) or awakens from sleep.   - SEVERE (8-10): Excruciating pain, unable to do any normal activities, unable to hold a cup of water.     No pain at the moment.   Right now I can wiggle my arm.   I took a Tylenol due to arthritis in my foot.    I took a TYlenol when I felt this coming on.     4. WORK OR EXERCISE: "Has there been any recent work or exercise that involved this part of the body?"     See above 5. CAUSE: "What do you think is causing the arm pain?"     I don't know 6. OTHER SYMPTOMS: "Do you have any other symptoms?" (e.g., neck pain, swelling, rash, fever, numbness, weakness)     No numbness right now.    When it happened my whole arm locked up.   I could not stretch it out or bend my arm.   I can bend my arm now. 7. PREGNANCY: "Is there any chance you are pregnant?" "When was your last menstrual period?"     N/A  Protocols used: Arm  Pain-A-AH

## 2022-05-29 NOTE — Telephone Encounter (Signed)
  Chief Complaint: Left arm locks up and hurts real bad and has a numb feeling to it while at work lifting and putting things into boxes. Symptoms: Left arm pain and locks up. Frequency: Today it happened worse than it ever has.   Happened once last year. Pertinent Negatives: Patient denies injuries.  It just locks up and I can't move it. Disposition: [] ED /[] Urgent Care (no appt availability in office) / [x] Appointment(In office/virtual)/ []  [facility]/ [] Home Care/ [] Refused Recommended Disposition /[] [facility] Mobile [facility]/ []  Follow-up with PCP Additional Notes: Appt. Made with Dr. Beryle Flock for 05/30/2022 at 10:00.    Instructed to go to the ED if it becomes locked and he can't get it to unlock or the numbness doesn't resolve after a few minutes like it usually does.    He was agreeable to this plan.

## 2022-05-30 ENCOUNTER — Encounter: Payer: Self-pay | Admitting: Family Medicine

## 2022-05-30 ENCOUNTER — Ambulatory Visit (INDEPENDENT_AMBULATORY_CARE_PROVIDER_SITE_OTHER): Payer: Medicaid Other | Admitting: Family Medicine

## 2022-05-30 VITALS — BP 130/80 | HR 72 | Temp 97.8°F | Resp 12 | Wt 162.2 lb

## 2022-05-30 DIAGNOSIS — M62838 Other muscle spasm: Secondary | ICD-10-CM

## 2022-05-30 MED ORDER — METHOCARBAMOL 750 MG PO TABS: 750.0000 mg | ORAL_TABLET | Freq: Four times a day (QID) | ORAL | 1 refills | Status: AC | PRN

## 2022-05-30 NOTE — Progress Notes (Signed)
I,Sulibeya S Dimas,acting as a scribe for Shirlee Latch, MD.,have documented all relevant documentation on the behalf of Shirlee Latch, MD,as directed by  Shirlee Latch, MD while in the presence of Shirlee Latch, MD.     Established patient visit   Patient: Bryan Herring   DOB: Dec 29, 1960   62 y.o. Male  MRN: [id number] Visit Date: 05/30/2022  Today's healthcare provider: Shirlee Latch, MD   Chief Complaint  Patient presents with   Arm Pain   Subjective    Arm Pain  The injury mechanism was repetitive motion. The pain is present in the left forearm. The quality of the pain is described as aching. The pain radiates to the left arm. The pain is severe. Associated symptoms include muscle weakness, numbness and tingling. The symptoms are aggravated by movement. He has tried acetaminophen for the symptoms.  patient reports pain lasted about 30 minutes yesterday. He took Tylenol and massage helped with pain.   Medications: Outpatient Medications Prior to Visit  Medication Sig   acetaminophen (TYLENOL) 650 MG CR tablet Take 650 mg by mouth every 8 (eight) hours as needed for pain.   carvedilol (COREG) 12.5 MG tablet Take 1 tablet (12.5 mg total) by mouth 2 (two) times daily with a meal.   losartan (COZAAR) 25 MG tablet Take 1 tablet (25 mg total) by mouth daily.   rosuvastatin (CRESTOR) 5 MG tablet Take 1 tablet (5 mg total) by mouth daily.   No facility-administered medications prior to visit.    Review of Systems  Neurological:  Positive for tingling and numbness.       Objective    BP 130/80 (BP Location: Left Arm, Patient Position: Sitting, Cuff Size: Normal)   Pulse 72   Temp 97.8 F (36.6 C) (Temporal)   Resp 12   Wt 162 lb 3.2 oz (73.6 kg)   BMI 22.62 kg/m    Physical Exam Constitutional:      General: He is not in acute distress.    Appearance: Normal appearance. He is not diaphoretic.  HENT:     Head: Normocephalic.  Eyes:      Conjunctiva/sclera: Conjunctivae normal.  Cardiovascular:     Rate and Rhythm: Normal rate and regular rhythm.     Pulses: Normal pulses.  Pulmonary:     Effort: Pulmonary effort is normal. No respiratory distress.  Musculoskeletal:     Comments: L arm with normal ROM, no muscle tightness or TTP, no joint swelling  Neurological:     Mental Status: He is alert and oriented to person, place, and time. Mental status is at baseline.       No results found for any visits on 05/30/22.  Assessment & Plan     1. Muscle spasm - new problem - has had similar issue in legs previously - repetitive motions at work seems to have exacerbated it - can use muscle relaxer prn - advised stretching in between tasks at work as able - check TSH, CK , and CMP to ensure no underlying other etiologies - CK (Creatine Kinase) - TSH - Renal Function Panel  Meds ordered this encounter  Medications   methocarbamol (ROBAXIN-750) 750 MG tablet    Sig: Take 1 tablet (750 mg total) by mouth every 6 (six) hours as needed for muscle spasms.    Dispense:  60 tablet    Refill:  1     Return in about 2 months (around 07/30/2022) for as scheduled.  I, Shirlee Latch, MD, have reviewed all documentation for this visit. The documentation on 05/30/22 for the exam, diagnosis, procedures, and orders are all accurate and complete.   Gatlyn Lipari, Marzella Schlein, MD, MPH [facility]

## 2022-05-31 LAB — TSH: TSH: 1.16 u[IU]/mL (ref 0.450–4.500)

## 2022-05-31 LAB — RENAL FUNCTION PANEL
Albumin: 4.4 g/dL (ref 3.9–4.9)
BUN/Creatinine Ratio: 17 (ref 10–24)
BUN: 16 mg/dL (ref 8–27)
CO2: 24 mmol/L (ref 20–29)
Calcium: 9.6 mg/dL (ref 8.6–10.2)
Chloride: 103 mmol/L (ref 96–106)
Creatinine, Ser: 0.94 mg/dL (ref 0.76–1.27)
Glucose: 91 mg/dL (ref 70–99)
Phosphorus: 4.2 mg/dL — ABNORMAL HIGH (ref 2.8–4.1)
Potassium: 4.5 mmol/L (ref 3.5–5.2)
Sodium: 140 mmol/L (ref 134–144)
eGFR: 92 mL/min/{1.73_m2} (ref >59)

## 2022-05-31 LAB — CK: Total CK: 204 U/L (ref 41–331)

## 2022-06-12 ENCOUNTER — Other Ambulatory Visit: Payer: Self-pay

## 2022-06-15 ENCOUNTER — Ambulatory Visit: Payer: Self-pay | Admitting: *Deleted

## 2022-06-15 ENCOUNTER — Encounter: Payer: Self-pay | Admitting: Physician Assistant

## 2022-06-15 ENCOUNTER — Ambulatory Visit (INDEPENDENT_AMBULATORY_CARE_PROVIDER_SITE_OTHER): Payer: Medicaid Other | Admitting: Physician Assistant

## 2022-06-15 VITALS — BP 123/83 | HR 85 | Temp 98.4°F | Wt 166.0 lb

## 2022-06-15 DIAGNOSIS — L309 Dermatitis, unspecified: Secondary | ICD-10-CM

## 2022-06-15 DIAGNOSIS — M1990 Unspecified osteoarthritis, unspecified site: Secondary | ICD-10-CM | POA: Diagnosis not present

## 2022-06-15 NOTE — Telephone Encounter (Signed)
  Chief Complaint: itching- hands- patient is not sure of cause Symptoms: itching/rash on thumb of left hand, R hand has lump in center-itches also- patient thinks it may be medication related( recently stared antiinflammatory)- but has no other symptoms Frequency: started yesterday Pertinent Negatives: Patient denies trouble swallowing/breathing Disposition: [] ED /[] Urgent Care (no appt availability in office) / [x] Appointment(In office/virtual)/ []  [facility]/ [] Home Care/ [] Refused Recommended Disposition /[] [place] [facility]/ []  Follow-up with PCP Additional Notes: Patient is concerned he may be having an allergic reaction to something - he is suffering from severe itching on both hands. Appointment scheduled  Reason for Disposition  [1] Severe localized itching AND [2] after 2 days of steroid cream  Answer Assessment - Initial Assessment Questions 1. APPEARANCE of RASH: "Describe the rash."      Hand started itching yesterday- painful- lump on hand 2. LOCATION: "Where is the rash located?"      Both hands- left thumb, right hand lump 3. NUMBER: "How many spots are there?"      Left thumb rash- burning sensation 4. SIZE: "How big are the spots?" (Inches, centimeters or compare to size of a coin)      localized 5. ONSET: "When did the rash start?"      yesterday 6. ITCHING: "Does the rash itch?" If Yes, ask: "How bad is the itch?"  (Scale 0-10; or none, mild, moderate, severe)     Yes- severe 7. PAIN: "Does the rash hurt?" If Yes, ask: "How bad is the pain?"  (Scale 0-10; or none, mild, moderate, severe)    - NONE (0): no pain    - MILD (1-3): doesn't interfere with normal activities     - MODERATE (4-7): interferes with normal activities or awakens from sleep     - SEVERE (8-10): excruciating pain, unable to do any normal activities     R hand- pain 8. OTHER SYMPTOMS: "Do you have any other symptoms?" (e.g., fever)     No other symptoms  Protocols used: Rash or  Redness - Localized-A-AH

## 2022-06-15 NOTE — Progress Notes (Signed)
Established patient visit   Patient: Bryan Herring   DOB: 27-Feb-1960   62 y.o. Male  MRN: [id number] Visit Date: 06/15/2022  Today's healthcare provider: Debera Lat, PA-C   No chief complaint on file.  Subjective    HPI  Patient complains of right hand pain and left thumb itches.  The patient, with a history of arthritis, presents with complaints of pain and swelling in the arm. 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 The patient has been taking muscle relaxants and Tylenol to manage the pain, but reports that the symptoms persist. He also reports an itching sensation in the same area. The patient mentions a past trauma to the hand that was never treated, and he believes this may be contributing to his current symptoms. 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 He believes this may have contributed to the dryness and itching he is currently experiencing. Medications: Outpatient Medications Prior to Visit  Medication Sig   acetaminophen (TYLENOL) 650 MG CR tablet Take 650 mg by mouth every 8 (eight) hours as needed for pain.   carvedilol (COREG) 12.5 MG tablet Take 1 tablet (12.5 mg total) by mouth 2 (two) times daily with a meal.   losartan (COZAAR) 25 MG tablet Take 1 tablet (25 mg total) by mouth daily.   methocarbamol (ROBAXIN-750) 750 MG tablet Take 1 tablet (750 mg total) by mouth every 6 (six) hours as needed for muscle spasms.   rosuvastatin (CRESTOR) 5 MG tablet Take 1 tablet (5 mg total) by mouth daily.   No facility-administered medications prior to visit.    Review of Systems  All other systems reviewed and are negative. Except see HPI      Objective    There were no vitals taken for this visit.   Physical  Exam Constitutional:      General: He is not in acute distress.    Appearance: Normal appearance. He is not diaphoretic.  HENT:     Head: Normocephalic.  Eyes:     Conjunctiva/sclera: Conjunctivae normal.  Pulmonary:     Effort: Pulmonary effort is normal. No respiratory distress.  Musculoskeletal:        General: Tenderness (both hands) and deformity (both hands) present.  Skin:    General: Skin is dry.     Findings: Erythema (mild) and rash present. No bruising.  Neurological:     Mental Status: He is alert and oriented to person, place, and time. Mental status is at baseline.     No results found for any visits on 06/15/22.  Assessment & Plan     1. Arthritis Of both hands Pain and swelling in the hands, possibly related to overuse at work. Patient has been taking Tylenol 650mg  for pain relief. -Continue Tylenol 650mg  as needed for pain. -Consider OTC Voltaren gel for topical pain relief. -Perform daily hand exercises and contrast baths for pain management. Work-related musculoskeletal strain: Patient reports heavy lifting at work, which may contribute to arthritis symptoms. -Continue hand exercises and contrast baths. -Ensure proper lifting techniques and use of gloves at work.  Dermatitis: Itching and dryness on the hands, possibly related to work exposure -Apply Hydrocortisone cream for itching. -Consider Benadryl at bedtime for persistent itching. -Maintain hand hygiene and  moisturize regularly.   No follow-ups on file.      The patient was advised to call back or seek an in-person evaluation if the symptoms worsen or if the condition fails to improve as anticipated.  I discussed the assessment and treatment plan with the patient. The patient was provided an opportunity to ask questions and all were answered. The patient agreed with the plan and demonstrated an understanding of the instructions.  I, Debera Lat, PA-C have reviewed all documentation for this  visit. The documentation on  06/15/22 for the exam, diagnosis, procedures, and orders are all accurate and complete.  Debera Lat, [facility], MMS [facility] [phone] (phone) [phone] (fax)  [facility]

## 2022-08-03 ENCOUNTER — Ambulatory Visit (INDEPENDENT_AMBULATORY_CARE_PROVIDER_SITE_OTHER): Payer: MEDICAID | Admitting: Family Medicine

## 2022-08-03 ENCOUNTER — Encounter: Payer: Self-pay | Admitting: Family Medicine

## 2022-08-03 VITALS — BP 130/85 | HR 76 | Temp 98.5°F | Ht 70.98 in | Wt 169.5 lb

## 2022-08-03 DIAGNOSIS — Z23 Encounter for immunization: Secondary | ICD-10-CM

## 2022-08-03 DIAGNOSIS — Z0000 Encounter for general adult medical examination without abnormal findings: Secondary | ICD-10-CM

## 2022-08-03 DIAGNOSIS — R252 Cramp and spasm: Secondary | ICD-10-CM | POA: Diagnosis not present

## 2022-08-03 DIAGNOSIS — I10 Essential (primary) hypertension: Secondary | ICD-10-CM | POA: Diagnosis not present

## 2022-08-03 DIAGNOSIS — E782 Mixed hyperlipidemia: Secondary | ICD-10-CM

## 2022-08-03 DIAGNOSIS — R7303 Prediabetes: Secondary | ICD-10-CM | POA: Diagnosis not present

## 2022-08-03 MED ORDER — ROSUVASTATIN CALCIUM 5 MG PO TABS: 5.0000 mg | ORAL_TABLET | Freq: Every day | ORAL | 3 refills | Status: DC

## 2022-08-03 MED ORDER — LOSARTAN POTASSIUM 25 MG PO TABS: 25.0000 mg | ORAL_TABLET | Freq: Every day | ORAL | 1 refills | Status: DC

## 2022-08-03 MED ORDER — CARVEDILOL 12.5 MG PO TABS: 12.5000 mg | ORAL_TABLET | Freq: Two times a day (BID) | ORAL | 1 refills | Status: DC

## 2022-08-03 NOTE — Assessment & Plan Note (Signed)
-   Recheck at next visit

## 2022-08-03 NOTE — Assessment & Plan Note (Signed)
Well controlled Continue current medications Reviewed metabolic panel 

## 2022-08-03 NOTE — Assessment & Plan Note (Signed)
Previously well controlled Continue statin Repeat FLP and CMP at next visit 

## 2022-08-03 NOTE — Progress Notes (Signed)
Complete physical exam  Patient: Bryan Herring   DOB: 1960-11-27   62 y.o. Male  MRN: [id number]  Subjective:    Chief Complaint  Patient presents with   Annual Exam    Bryan Herring is a 62 y.o. male who presents today for a complete physical exam. He reports consuming a general diet.  He generally feels well. He reports sleeping fairly well. He does not have additional problems to discuss today.   Discussed the use of AI scribe software for clinical note transcription with the patient, who gave verbal consent to proceed.  History of Present Illness   The patient, with a history of prediabetes and elevated cholesterol, presents for a routine checkup. They report recent eye issues, including squinting and a diagnosis of a cataract in the left eye. The right eye is reportedly in good condition.  The patient also describes experiencing muscle cramps, particularly in the abdominal region when bending down. The cramps are severe enough to interrupt activities and require rest and massage to alleviate. The patient also reports occasional night cramps in the leg.  The patient recently returned from a trip to [place] and is due to return to work following a period of voluntary unpaid leave. They express relief at retaining their job following layoffs at their workplace.       Most recent fall risk assessment:    08/03/2022    1:25 PM  Fall Risk   Falls in the past year? 0  Number falls in past yr: 0  Injury with Fall? 0  Risk for fall due to : No Fall Risks  Follow up Falls evaluation completed     Most recent depression screenings:    08/03/2022    1:26 PM 05/30/2022   10:08 AM  PHQ 2/9 Scores  PHQ - 2 Score 0 0  PHQ- 9 Score 0 2        Patient Care Team: Erasmo Downer, MD as PCP - General (Family Medicine) Debbe Odea, MD as PCP - Cardiology (Cardiology)   Outpatient Medications Prior to Visit  Medication Sig   acetaminophen  (TYLENOL) 650 MG CR tablet Take 650 mg by mouth every 8 (eight) hours as needed for pain.   methocarbamol (ROBAXIN-750) 750 MG tablet Take 1 tablet (750 mg total) by mouth every 6 (six) hours as needed for muscle spasms.   [DISCONTINUED] carvedilol (COREG) 12.5 MG tablet Take 1 tablet (12.5 mg total) by mouth 2 (two) times daily with a meal.   [DISCONTINUED] losartan (COZAAR) 25 MG tablet Take 1 tablet (25 mg total) by mouth daily.   [DISCONTINUED] rosuvastatin (CRESTOR) 5 MG tablet Take 1 tablet (5 mg total) by mouth daily.   No facility-administered medications prior to visit.    ROS per HPI     Objective:     BP 130/85 (BP Location: Left Arm, Patient Position: Sitting, Cuff Size: Normal)   Pulse 76   Temp 98.5 F (36.9 C) (Oral)   Ht 5' 10.98" (1.803 m)   Wt 169 lb 8 oz (76.9 kg)   SpO2 100%   BMI 23.65 kg/m    Physical Exam Vitals reviewed.  Constitutional:      General: He is not in acute distress.    Appearance: Normal appearance. He is well-developed. He is not diaphoretic.  HENT:     Head: Normocephalic and atraumatic.     Right Ear: Tympanic membrane, ear canal and external ear normal.  Left Ear: Tympanic membrane, ear canal and external ear normal.     Nose: Nose normal.     Mouth/Throat:     Mouth: Mucous membranes are moist.     Pharynx: Oropharynx is clear. No oropharyngeal exudate.  Eyes:     General: No scleral icterus.    Conjunctiva/sclera: Conjunctivae normal.     Pupils: Pupils are equal, round, and reactive to light.  Neck:     Thyroid: No thyromegaly.  Cardiovascular:     Rate and Rhythm: Normal rate and regular rhythm.     Heart sounds: Normal heart sounds. No murmur heard. Pulmonary:     Effort: Pulmonary effort is normal. No respiratory distress.     Breath sounds: Normal breath sounds. No wheezing or rales.  Abdominal:     General: There is no distension.     Palpations: Abdomen is soft.     Tenderness: There is no abdominal  tenderness.  Musculoskeletal:        General: No deformity.     Cervical back: Neck supple.     Right lower leg: No edema.     Left lower leg: No edema.  Lymphadenopathy:     Cervical: No cervical adenopathy.  Skin:    General: Skin is warm and dry.     Findings: No rash.  Neurological:     Mental Status: He is alert and oriented to person, place, and time. Mental status is at baseline.     Gait: Gait normal.  Psychiatric:        Mood and Affect: Mood normal.        Behavior: Behavior normal.        Thought Content: Thought content normal.      No results found for any visits on 08/03/22.     Assessment & Plan:    Routine Health Maintenance and Physical Exam  Immunization History  Administered Date(s) Administered   Influenza,inj,Quad PF,6+ Mos 12/16/2020   Zoster Recombinant(Shingrix) 08/03/2022    Health Maintenance  Topic Date Due   COVID-19 Vaccine (1) Never done   DTaP/Tdap/Td (1 - Tdap) Never done   INFLUENZA VACCINE  08/10/2022   Zoster Vaccines- Shingrix (2 of 2) 09/28/2022   Colonoscopy  01/21/2031   Hepatitis C Screening  Completed   HIV Screening  Completed   HPV VACCINES  Aged Out    Discussed health benefits of physical activity, and encouraged him to engage in regular exercise appropriate for his age and condition.  Problem List Items Addressed This Visit       Cardiovascular and Mediastinum   Essential hypertension    Well controlled Continue current medications Reviewed metabolic panel      Relevant Medications   carvedilol (COREG) 12.5 MG tablet   rosuvastatin (CRESTOR) 5 MG tablet   losartan (COZAAR) 25 MG tablet     Other   Hyperlipidemia    Previously well controlled Continue statin Repeat FLP and CMP at next visit      Relevant Medications   carvedilol (COREG) 12.5 MG tablet   rosuvastatin (CRESTOR) 5 MG tablet   losartan (COZAAR) 25 MG tablet   Prediabetes    Recheck at next visit      Other Visit Diagnoses      Encounter for annual physical exam    -  Primary   Muscle cramps       Need for shingles vaccine       Relevant Orders   Zoster Recombinant (Shingrix ) (  Completed)          Visual Impairment: Patient reports squinting and has been evaluated by ophthalmology. Cataract noted in left eye. Patient needs to follow up with ophthalmology for results and potential need for glasses. -Provided patient with phone number for ophthalmology office to follow up on results and potential need for glasses.  Prediabetes and Hyperlipidemia: Stable, no changes in management needed at this time. -Continue current management. -Refill rosuvastatin and losartan to [facility]. -Refill carvedilol as well.  Abdominal Muscle Cramps: Patient reports cramping in abdominal muscles when bending over. No signs of hernia on examination. -Advised patient on home remedies for muscle cramps, including yellow mustard.  Vaccinations: Patient is due for shingles vaccine and tetanus shot. -Administer first dose of shingles vaccine today. -Plan to administer second dose of shingles vaccine at next visit in six months. -Plan to administer tetanus shot at a future visit.  - advised on need for flu and covid vaccine in the fall  Hypertension: Initial blood pressure reading was high. Plan to recheck before end of visit. -Recheck blood pressure before end of visit.  Follow-up: Plan to see patient again in six months for labs and second dose of shingles vaccine. -Schedule follow-up visit in six months.        Return in about 6 months (around 02/03/2023) for chronic disease f/u, shingrix #2.     Shirlee Latch, MD

## 2022-08-15 ENCOUNTER — Encounter: Payer: Self-pay | Admitting: Family Medicine

## 2022-08-15 ENCOUNTER — Ambulatory Visit (INDEPENDENT_AMBULATORY_CARE_PROVIDER_SITE_OTHER): Payer: MEDICAID | Admitting: Family Medicine

## 2022-08-15 VITALS — BP 127/97 | HR 78 | Temp 97.6°F | Resp 12 | Ht 71.0 in | Wt 164.4 lb

## 2022-08-15 DIAGNOSIS — G8929 Other chronic pain: Secondary | ICD-10-CM

## 2022-08-15 DIAGNOSIS — M545 Low back pain, unspecified: Secondary | ICD-10-CM

## 2022-08-15 DIAGNOSIS — U071 COVID-19: Secondary | ICD-10-CM

## 2022-08-15 LAB — POC COVID19 BINAXNOW: SARS Coronavirus 2 Ag: POSITIVE — AB

## 2022-08-15 NOTE — Assessment & Plan Note (Signed)
Symptoms started Saturday Encourage symptom management with OTC methods  Can return to work on Thursday

## 2022-08-15 NOTE — Progress Notes (Signed)
   Established Patient Office Visit  Subjective   Patient ID: Bryan Herring, male    DOB: April 28, 1960  Age: 62 y.o. MRN: [id number]  Chief Complaint  Patient presents with   Back Pain    Bryan Herring is here for a couple month history of back pain. He had a back injury while he was young and has had chronic back pain for months. He recently slept on the floor which made the pain worse. He also has arthritis. 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 Has has a couple of days of congestion and his throat feeling weird. Does not have any problems breathing. Happened to align with his visit. Wants to know how long he should be out of work for [employer].   Review of Systems  Constitutional:  Negative for fever.  HENT:  Positive for congestion. Negative for sore throat.   Respiratory:  Positive for cough and sputum production. Negative for shortness of breath.      Objective:     BP (!) 127/97 (BP Location: Right Arm, Patient Position: Sitting, Cuff Size: Normal)   Pulse 78   Temp 97.6 F (36.4 C) (Temporal)   Resp 12   Ht 5\' 11"  (1.803 m)   Wt 164 lb 6.4 oz (74.6 kg)   SpO2 99%   BMI 22.93 kg/m    Physical Exam Constitutional:      Appearance: Normal appearance.  HENT:     Nose: Congestion present.  Cardiovascular:     Rate and Rhythm: Normal rate and regular rhythm.     Heart sounds: Normal heart sounds.  Pulmonary:     Effort: Pulmonary effort is normal.     Breath sounds: Normal breath sounds.  Neurological:     General: No focal deficit present.     Mental Status: He is alert and oriented to person, place, and time.    No results found for any visits on 08/15/22.  The 10-year ASCVD risk score (Arnett DK, et al., 2019) is: 21.5%    Assessment & Plan:   Problem List Items Addressed This Visit       Other   Chronic bilateral low back pain without sciatica    Worsening over the past couple of months  Encourage use of arthritis  tylenol prn  Note for work was completed        COVID-19 - Primary    Symptoms started Saturday Encourage symptom management with OTC methods  Can return to work on Thursday       Relevant Orders   POC COVID-19 (Completed)    Return if symptoms worsen or fail to improve.    Rometta Emery, Medical Student  Total time spent on today's visit was greater than 30 minutes, including both face-to-face time and nonface-to-face time personally spent on review of chart (labs and imaging), discussing labs and goals, discussing treatment options, answering patient's questions, and coordinating care.   Patient seen along with MS3 student Jodi Marble. I personally evaluated this patient along with the student, and verified all aspects of the history, physical exam, and medical decision making as documented by the student. I agree with the student's documentation and have made all necessary edits.  , Marzella Schlein, MD, MPH [facility]

## 2022-08-15 NOTE — Assessment & Plan Note (Signed)
Worsening over the past couple of months  Encourage use of arthritis tylenol prn  Note for work was completed

## 2022-10-02 NOTE — Progress Notes (Unsigned)
Cardiology Office Note    Date:  10/03/2022   ID:  Bryan Herring, DOB 1960-10-30, MRN [id number]  PCP:  Erasmo Downer, MD  Cardiologist:  Debbe Odea, MD  Electrophysiologist:  None   Chief Complaint: Follow up  History of Present Illness:   Bryan Herring is a 62 y.o. male with history of polysubstance abuse including heroin and cocaine for 30+ years, HFimpEF, HTN, HLD, and left ear deafness who presents for follow-up of NICM.  He was previously evaluated in 2020 for follow-up of syncopal event with recommendation for echo and a cardiac monitor (which he declined).  Echo was subsequently obtained in 2021 during admission for heroin overdose which showed an EF of 40 to 45%, global hypokinesis, grade 1 diastolic dysfunction, normal RV systolic function and ventricular cavity size, and mild tricuspid regurgitation.  Discharge summary indicates diagnosis of A-fib with RVR, EKG is unable to be interpreted as such given significant underlying motion artifact.  He reestablished care with cardiology in 09/2020, at which time he reported he stopped using illicit substances in 04/2020, after checking into a rehab program.  In the setting of his prior cardiomyopathy, he underwent Lexiscan MPI in 10/2020 that showed no evidence of ischemia and was overall low risk with an EF of 55 to 65%.  No significant coronary artery calcification noted.  Echo from 11/2020 showed an EF of 50 to 55%, no regional wall motion abnormalities, grade 2 diastolic dysfunction, normal RV systolic function and ventricular cavity size, mild mitral regurgitation, mild to moderate tricuspid regurgitation, and a mildly dilated aortic root measuring 40 mm.  He was last seen in our office in 06/2021 and was without symptoms of angina or cardiac decompensation.  He continued to smoke tobacco.  He comes in doing well from a cardiac perspective and is without symptoms of angina or cardiac decompensation.  No  dyspnea, dizziness, presyncope, or syncope.  No lower extremity swelling, abdominal distention, or progressive orthopnea.  Weight stable.  Continues to abstain from illicit substances.  Continues to smoke approximately 1/2 pack daily and has nicotine patches at home.  Not yet ready to fully quit.  Adherent and tolerating cardiac medications.  Blood pressure is a little elevated in the office today, he attributes this to diet throughout the day today.  Overall, he feels well and does not have any acute cardiac concerns at this time.   Labs independently reviewed: 05/2022 - BUN 16, serum creatinine 0.94, potassium 4.5, albumin 4.4, TSH normal 04/2022 - A1c 5.9, TC 177, TG 91, HDL 53, LDL 107, AST/ALT normal 07/2020 - Hgb 11.3, PLT 207  Past Medical History:  Diagnosis Date   Deaf    left ear   Deformity    R 5th finger   Essential hypertension 03/01/2018   HFrEF (heart failure with reduced ejection fraction) (HCC)    Hyperlipidemia    Substance abuse (HCC)     Past Surgical History:  Procedure Laterality Date   ACHILLES TENDON SURGERY Left    COLONOSCOPY WITH PROPOFOL N/A 01/20/2021   Procedure: COLONOSCOPY WITH PROPOFOL;  Surgeon: Wyline Mood, MD;  Location[facility] ENDOSCOPY;  Service: Gastroenterology;  Laterality: N/A;    Current Medications: Current Meds  Medication Sig   acetaminophen (TYLENOL) 650 MG CR tablet Take 650 mg by mouth every 8 (eight) hours as needed for pain.   carvedilol (COREG) 12.5 MG tablet Take 1 tablet (12.5 mg total) by mouth 2 (two) times daily with a meal.  methocarbamol (ROBAXIN-750) 750 MG tablet Take 1 tablet (750 mg total) by mouth every 6 (six) hours as needed for muscle spasms.   [DISCONTINUED] losartan (COZAAR) 25 MG tablet Take 1 tablet (25 mg total) by mouth daily.   [DISCONTINUED] rosuvastatin (CRESTOR) 5 MG tablet Take 1 tablet (5 mg total) by mouth daily.    Allergies:   No known allergies   Social History   Socioeconomic History   Marital  status: Single    Spouse name: Not on file   Number of children: 2   Years of education: Not on file   Highest education level: Not on file  Occupational History   Not on file  Tobacco Use   Smoking status: Every Day    Current packs/day: 0.25    Average packs/day: 0.3 packs/day for 45.0 years (11.3 ttl pk-yrs)    Types: Cigarettes   Smokeless tobacco: Never   Tobacco comments:    08/16/21 Patient no longer at [facility], now resided at [facility] in [place]    Patient wearing nicotine patch  Vaping Use   Vaping status: Never Used  Substance and Sexual Activity   Alcohol use: Not Currently    Comment: last use April 14, 2020   Drug use: Not Currently    Types: Marijuana, Heroin, Cocaine    Comment: last use April 14, 2020   Sexual activity: Not Currently  Other Topics Concern   Not on file  Social History Narrative   Not on file   Social Determinants of Health   Financial Resource Strain: Not on file  Food Insecurity: No Food Insecurity (07/19/2021)   Hunger Vital Sign    Worried About Running Out of Food in the Last Year: Never true    Ran Out of Food in the Last Year: Never true  Transportation Needs: Unmet Transportation Needs (07/19/2021)   PRAPARE - Administrator, Civil Service (Medical): Yes    Lack of Transportation (Non-Medical): Yes  Physical Activity: Not on file  Stress: Not on file  Social Connections: Not on file     Family History:  The patient's family history includes Alcohol abuse in his son; Aneurysm in his mother; Anuerysm in his maternal aunt; Hypertension in his brother, maternal aunt, and sister; Other in his father, maternal grandfather, maternal grandmother, paternal grandfather, and paternal grandmother. There is no history of Colon cancer, Prostate cancer, or Breast cancer.  ROS:   12-point review of systems is negative unless otherwise noted in the HPI.   EKGs/Labs/Other Studies Reviewed:    Studies reviewed were summarized above. The  additional studies were reviewed today:  2D echo 11/10/2020: 1. Left ventricular ejection fraction, by estimation, is 50 to 55%. The  left ventricle has low normal function. The left ventricle has no regional  wall motion abnormalities. Left ventricular diastolic parameters are  consistent with Grade II diastolic  dysfunction (pseudonormalization).   2. Right ventricular systolic function is normal. The right ventricular  size is normal.   3. Right atrial size was mildly dilated.   4. The mitral valve is normal in structure. Mild mitral valve  regurgitation.   5. Tricuspid valve regurgitation is mild to moderate.   6. The aortic valve is tricuspid. Aortic valve regurgitation is not  visualized.   7. Aortic dilatation noted. There is mild dilatation of the aortic root,  measuring 40 mm.   8. The inferior vena cava is normal in size with greater than 50%  respiratory variability, suggesting  right atrial pressure of 3 mmHg.   Comparison(s): EF 40 %; AOR 32mm.  __________  Eugenie Birks MPI 10/13/2020:   The study is normal. The study is low risk.   The left ventricular ejection fraction is normal (55-65%). End diastolic cavity size is normal.   There is no evidence for sichemia   no significant coronary artery calcifications noted __________  2D echo 02/04/2019: 1. Left ventricular ejection fraction, by visual estimation, is 40 to  45%. The left ventricle has moderately decreased function. There is no  left ventricular hypertrophy.   2. Left ventricular diastolic parameters are consistent with Grade I  diastolic dysfunction (impaired relaxation).   3. The left ventricle demonstrates global hypokinesis.   4. Global right ventricle has normal systolic function.The right  ventricular size is normal. No increase in right ventricular wall  thickness.   5. Left atrial size was normal.   6. Right atrial size was normal.   7. The mitral valve is normal in structure. No evidence of mitral  valve  regurgitation. No evidence of mitral stenosis.   8. The tricuspid valve is normal in structure.   9. The tricuspid valve is normal in structure. Tricuspid valve  regurgitation is mild.  10. The aortic valve is normal in structure. Aortic valve regurgitation is  not visualized. No evidence of aortic valve sclerosis or stenosis.  11. The pulmonic valve was normal in structure. Pulmonic valve  regurgitation is not visualized.  12. The inferior vena cava is dilated in size with >50% respiratory  variability, suggesting right atrial pressure of 8 mmHg.  13. No prior echocardiogram.    EKG:  EKG is ordered today.  The EKG ordered today demonstrates NSR, 77 bpm, no acute st/t changes  Recent Labs: 05/04/2022: ALT 11 05/30/2022: BUN 16; Creatinine, Ser 0.94; Potassium 4.5; Sodium 140; TSH 1.160  Recent Lipid Panel    Component Value Date/Time   CHOL 177 05/04/2022 1418   TRIG 91 05/04/2022 1418   HDL 53 05/04/2022 1418   CHOLHDL 3.3 05/04/2022 1418   LDLCALC 107 (H) 05/04/2022 1418    PHYSICAL EXAM:    VS:  BP (!) 148/104 (BP Location: Left Arm, Patient Position: Sitting, Cuff Size: Normal)   Pulse 77   Ht 5\' 11"  (1.803 m)   Wt 165 lb 3.2 oz (74.9 kg)   SpO2 98%   BMI 23.04 kg/m   BMI: Body mass index is 23.04 kg/m.  Physical Exam Vitals reviewed.  Constitutional:      Appearance: He is well-developed.  HENT:     Head: Normocephalic and atraumatic.  Eyes:     General:        Right eye: No discharge.        Left eye: No discharge.  Cardiovascular:     Rate and Rhythm: Normal rate and regular rhythm.     Heart sounds: Normal heart sounds, S1 normal and S2 normal. Heart sounds not distant. No midsystolic click and no opening snap. No murmur heard.    No friction rub.  Pulmonary:     Effort: Pulmonary effort is normal. No respiratory distress.     Breath sounds: Normal breath sounds. No decreased breath sounds, wheezing, rhonchi or rales.  Chest:     Chest wall: No  tenderness.  Abdominal:     General: There is no distension.  Musculoskeletal:     Cervical back: Normal range of motion.  Skin:    General: Skin is warm and dry.  Nails: There is no clubbing.  Neurological:     Mental Status: He is alert and oriented to person, place, and time.  Psychiatric:        Speech: Speech normal.        Behavior: Behavior normal.        Thought Content: Thought content normal.        Judgment: Judgment normal.     Wt Readings from Last 3 Encounters:  10/03/22 165 lb 3.2 oz (74.9 kg)  08/15/22 164 lb 6.4 oz (74.6 kg)  08/03/22 169 lb 8 oz (76.9 kg)     ASSESSMENT & PLAN:   HFimpEF secondary to NICM: He is doing well and without symptoms concerning for angina or cardiac decompensation with NYHA class II symptoms.  Cardiomyopathy felt to be likely secondary to polysubstance abuse including cocaine and heroin.  Most recent echo showed low normal LV systolic function.  Not requiring a standing loop diuretic.  Continue losartan and carvedilol.  Blood pressure typically well-controlled.  Moving forward, if his blood pressure remains elevated, or if he develops symptoms concerning for recurrence of heart failure would look to escalate GDMT.  HTN: Blood pressure elevated in the office today, though typically well-controlled.  Continue to monitor.  Remains on losartan and carvedilol.  HLD: LDL 107 in 04/2022.  He remains on Crestor 5 mg.  Tobacco use: Complete cessation is encouraged.  Has nicotine patches at home.  Not yet ready to quit.    Disposition: F/u with Dr. Azucena Cecil or an APP in 6 months.   Medication Adjustments/Labs and Tests Ordered: Current medicines are reviewed at length with the patient today.  Concerns regarding medicines are outlined above. Medication changes, Labs and Tests ordered today are summarized above and listed in the Patient Instructions accessible in Encounters.   Signed, Eula Listen, PA-C 10/03/2022 4:26 PM     [facility] [address] [place], [place] [address] [phone]

## 2022-10-03 ENCOUNTER — Ambulatory Visit: Payer: MEDICAID | Attending: Physician Assistant | Admitting: Physician Assistant

## 2022-10-03 ENCOUNTER — Encounter: Payer: Self-pay | Admitting: Physician Assistant

## 2022-10-03 VITALS — BP 148/104 | HR 77 | Ht 71.0 in | Wt 165.2 lb

## 2022-10-03 DIAGNOSIS — E782 Mixed hyperlipidemia: Secondary | ICD-10-CM

## 2022-10-03 DIAGNOSIS — F17200 Nicotine dependence, unspecified, uncomplicated: Secondary | ICD-10-CM | POA: Diagnosis not present

## 2022-10-03 DIAGNOSIS — I10 Essential (primary) hypertension: Secondary | ICD-10-CM | POA: Diagnosis not present

## 2022-10-03 DIAGNOSIS — I5032 Chronic diastolic (congestive) heart failure: Secondary | ICD-10-CM | POA: Diagnosis not present

## 2022-10-03 MED ORDER — LOSARTAN POTASSIUM 25 MG PO TABS: 25.0000 mg | ORAL_TABLET | Freq: Every day | ORAL | 3 refills | Status: DC

## 2022-10-03 MED ORDER — ROSUVASTATIN CALCIUM 5 MG PO TABS: 5.0000 mg | ORAL_TABLET | Freq: Every day | ORAL | 3 refills | Status: DC

## 2022-10-03 NOTE — Patient Instructions (Signed)
Medication Instructions:  Your Physician recommend you continue on your current medication as directed.    *If you need a refill on your cardiac medications before your next appointment, please call your pharmacy*   Lab Work: None If you have labs (blood work) drawn today and your tests are completely normal, you will receive your results only by: MyChart Message (if you have MyChart) OR A paper copy in the mail If you have any lab test that is abnormal or we need to change your treatment, we will call you to review the results.  Follow-Up: At [facility], you and your health needs are our priority.  As part of our continuing mission to provide you with exceptional heart care, we have created designated Provider Care Teams.  These Care Teams include your primary Cardiologist (physician) and Advanced Practice Providers (APPs -  Physician Assistants and Nurse Practitioners) who all work together to provide you with the care you need, when you need it.  We recommend signing up for the patient portal called "MyChart".  Sign up information is provided on this After Visit Summary.  MyChart is used to connect with patients for Virtual Visits (Telemedicine).  Patients are able to view lab/test results, encounter notes, upcoming appointments, etc.  Non-urgent messages can be sent to your provider as well.   To learn more about what you can do with MyChart, go to [email or web address].    Your next appointment:   6 month(s)  Provider:   You may see Debbe Odea, MD or one of the following Advanced Practice Providers on your designated Care Team:   Eula Listen, [place]

## 2023-01-25 ENCOUNTER — Encounter: Payer: Self-pay | Admitting: Emergency Medicine

## 2023-01-25 ENCOUNTER — Other Ambulatory Visit: Payer: Self-pay

## 2023-01-25 ENCOUNTER — Ambulatory Visit: Payer: Self-pay | Admitting: *Deleted

## 2023-01-25 ENCOUNTER — Emergency Department: Payer: MEDICAID

## 2023-01-25 ENCOUNTER — Emergency Department
Admission: EM | Admit: 2023-01-25 | Discharge: 2023-01-25 | Disposition: A | Discharge status: Home / Self Care | Payer: MEDICAID | Attending: Emergency Medicine | Admitting: Emergency Medicine

## 2023-01-25 DIAGNOSIS — I509 Heart failure, unspecified: Secondary | ICD-10-CM | POA: Diagnosis not present

## 2023-01-25 DIAGNOSIS — T148XXA Other injury of unspecified body region, initial encounter: Secondary | ICD-10-CM

## 2023-01-25 DIAGNOSIS — X58XXXA Exposure to other specified factors, initial encounter: Secondary | ICD-10-CM | POA: Insufficient documentation

## 2023-01-25 DIAGNOSIS — M545 Low back pain, unspecified: Secondary | ICD-10-CM

## 2023-01-25 DIAGNOSIS — S3992XA Unspecified injury of lower back, initial encounter: Secondary | ICD-10-CM | POA: Diagnosis present

## 2023-01-25 DIAGNOSIS — S39012A Strain of muscle, fascia and tendon of lower back, initial encounter: Secondary | ICD-10-CM | POA: Diagnosis not present

## 2023-01-25 LAB — COMPREHENSIVE METABOLIC PANEL
ALT: 13 U/L (ref 0–44)
AST: 18 U/L (ref 15–41)
Albumin: 3.7 g/dL (ref 3.5–5.0)
Alkaline Phosphatase: 59 U/L (ref 38–126)
Anion gap: 9 (ref 5–15)
BUN: 15 mg/dL (ref 8–23)
CO2: 24 mmol/L (ref 22–32)
Calcium: 8.9 mg/dL (ref 8.9–10.3)
Chloride: 105 mmol/L (ref 98–111)
Creatinine, Ser: 0.86 mg/dL (ref 0.61–1.24)
GFR, Estimated: >60 mL/min (ref >60)
Glucose, Bld: 95 mg/dL (ref 70–99)
Potassium: 4 mmol/L (ref 3.5–5.1)
Sodium: 138 mmol/L (ref 135–145)
Total Bilirubin: 0.6 mg/dL (ref 0.0–1.2)
Total Protein: 6.9 g/dL (ref 6.5–8.1)

## 2023-01-25 LAB — CBC
HCT: 37.3 % — ABNORMAL LOW (ref 39.0–52.0)
Hemoglobin: 12.5 g/dL — ABNORMAL LOW (ref 13.0–17.0)
MCH: 31.2 pg (ref 26.0–34.0)
MCHC: 33.5 g/dL (ref 30.0–36.0)
MCV: 93 fL (ref 80.0–100.0)
Platelets: 196 10*3/uL (ref 150–400)
RBC: 4.01 MIL/uL — ABNORMAL LOW (ref 4.22–5.81)
RDW: 12.7 % (ref 11.5–15.5)
WBC: 5.3 10*3/uL (ref 4.0–10.5)
nRBC: 0 % (ref 0.0–0.2)

## 2023-01-25 MED ORDER — ACETAMINOPHEN 500 MG PO TABS
1000.0000 mg | ORAL_TABLET | Freq: Once | ORAL | Status: AC
  Administered 2023-01-25: 1000 mg via ORAL
  Filled 2023-01-25: qty 2

## 2023-01-25 MED ORDER — KETOROLAC TROMETHAMINE 15 MG/ML IJ SOLN
15.0000 mg | Freq: Once | INTRAMUSCULAR | Status: AC
  Administered 2023-01-25: 15 mg via INTRAMUSCULAR
  Filled 2023-01-25: qty 1

## 2023-01-25 MED ORDER — LIDOCAINE 5 % EX PTCH: 1.0000 | MEDICATED_PATCH | CUTANEOUS | 11 refills | Status: DC

## 2023-01-25 MED ORDER — LIDOCAINE 5 % EX PTCH
1.0000 | MEDICATED_PATCH | CUTANEOUS | Status: DC
  Administered 2023-01-25: 1 via TRANSDERMAL
  Filled 2023-01-25: qty 1

## 2023-01-25 NOTE — Telephone Encounter (Signed)
Noted  

## 2023-01-25 NOTE — ED Triage Notes (Signed)
Pt states that he is having left sided back pain, denies any known injury but states that his job is very physical, denies hx of kidney stones, started last weekend

## 2023-01-25 NOTE — ED Provider Triage Note (Signed)
Emergency Medicine Provider Triage Evaluation Note  Bryan Herring , a 63 y.o. male  was evaluated in triage.  Pt complains of left sided flank pain, unsure if muscle or kidney.  Review of Systems  Positive:  Negative:   Physical Exam  BP (!) 182/121 (BP Location: Left Arm)   Pulse 80   Resp 16   Ht 5\' 11"  (1.803 m)   Wt 72.6 kg   SpO2 100%   BMI 22.32 kg/m  Gen:   Awake, no distress   Resp:  Normal effort  MSK:   Moves extremities without difficulty  Other:    Medical Decision Making  Medically screening exam initiated at 8:19 AM.  Appropriate orders placed.  Bryan Herring was informed that the remainder of the evaluation will be completed by another provider, this initial triage assessment does not replace that evaluation, and the importance of remaining in the ED until their evaluation is complete.     Faythe Ghee, PA-C 01/25/23 [phone]

## 2023-01-25 NOTE — Telephone Encounter (Addendum)
  Chief Complaint: Pt was outside of [facility] (there before office opened at 8:00) where he said his job sent him.  He is c/o severe sharp left lower abd pain.   10/10 on pain scale    My job sent me here.   I asked where exactly his job sent him.   "To your place, I think".    Is this the urgent care?   I let him know he was not at the urgent care that he was at [facility] and that we don't take walk ins.   He needed an appt.   I asked him a few more questions to find out more what was going on with him.   I sent him to the ED.   He was agreeable to this plan.   I stayed on the line with him until he got out to his car (he had come into the building) to go to the ED.   He ended the call.   He wanted to know if he could call us back if he needed to and I said he could.   Main thing was he needed to be seen in the ED right away due to the severe sharp abd pain he was having.   He is on his way to the ED now.  Symptoms: Severe sharp left lower abd pain Frequency: started this morning all of a sudden. Pertinent Negatives: Patient denies diarrhea, vomiting, fever, blood in urine or any urinary symptoms.   Denies back pain. Disposition: [x] ED /[] Urgent Care (no appt availability in office) / [] Appointment(In office/virtual)/ []  [place] [facility]/ [] Home Care/ [] Refused Recommended Disposition /[] [facility] [facility]/ []  Follow-up with PCP Additional Notes: Message sent to Dr. Beryle Flock that he has been referred to the ED.   Pt agreeable and is going there now.    Pt noted to be checked in at ED the at [facility] now.

## 2023-01-25 NOTE — Telephone Encounter (Signed)
Reason for Disposition  [1] SEVERE pain (e.g., excruciating) AND [2] present > 1 hour  Answer Assessment - Initial Assessment Questions 1. LOCATION: "Where does it hurt?"      I woke up this morning I felt a sharp pain in my left side.  It's still there.   My job sent me to see y'all.   I work for [employer] in [place].   I got your number from my job.    They told me to go to the urgent care.    No vomiting or diarrhea or fever when I asked him about these things.     I'm at the [facility] right now.   I'll go inside I guess.    "My side is hurting so bad I can't hardly move".    I let him know he needed to be seen in the ED not at the doctor's office for that kind of pain.   We do not take walk ins.  We are not able to do the kind of imaging and testing you need done to determine what is going on.   Yes your doctor is here but you need an appt in order to be seen.    I emphasized that he needed to be seen in the ED because of the very sharp pain 10/10 ln the pain scale he is describing.   He was agreeable to this.   He stayed on the phone with me until he got into his car.   He said he knew where the hospital [facility] was.   "It's close to here".    "I know where it is".   "I'll go there now".     I stayed on the line with him until he got into his car safely then he ended the call at this point. 2. RADIATION: "Does the pain shoot anywhere else?" (e.g., chest, back)     No  It's just in my left side a real sharp pain.   I can't hardly stand up.   He is groaning in pain when the sharp pains hit. 3. ONSET: "When did the pain begin?" (Minutes, hours or days ago)      This morning  4. SUDDEN: "Gradual or sudden onset?"     Suddenly 5. PATTERN "Does the pain come and go, or is it constant?"    - If it comes and goes: "How long does it last?" "Do you have pain now?"     (Note: Comes and goes means the pain is intermittent. It goes away completely  between bouts.)    - If constant: "Is it getting better, staying the same, or getting worse?"      (Note: Constant means the pain never goes away completely; most serious pain is constant and gets worse.)      It's shooting sharp pains 10/10 in my side. 6. SEVERITY: "How bad is the pain?"  (e.g., Scale 1-10; mild, moderate, or severe)    - MILD (1-3): Doesn't interfere with normal activities, abdomen soft and not tender to touch.     - MODERATE (4-7): Interferes with normal activities or awakens from sleep, abdomen tender to touch.     - SEVERE (8-10): Excruciating pain, doubled over, unable to do any normal activities.       Severe He was agreeable to going to the ED.   7. RECURRENT SYMPTOM: "Have you ever had this type of stomach pain before?"  If Yes, ask: "When was the last time?" and "What happened that time?"      Not asked 8. CAUSE: "What do you think is causing the stomach pain?"     I don't know 9. RELIEVING/AGGRAVATING FACTORS: "What makes it better or worse?" (e.g., antacids, bending or twisting motion, bowel movement)     I can't hardly stand up because it hurts so bad. 10. OTHER SYMPTOMS: "Do you have any other symptoms?" (e.g., back pain, diarrhea, fever, urination pain, vomiting)       Denies diarrhea, vomiting, blood in urine, urinary changes, or fever.  Protocols used: Abdominal Pain - Male-A-AH

## 2023-01-25 NOTE — ED Provider Notes (Signed)
[facility] Provider Note    Event Date/Time   First MD Initiated Contact with Patient 01/25/23 [phone]     (approximate)   History   Back Pain   HPI  Bryan Herring is a 63 y.o. male CHF, tension, hyperlipidemia, prior history of substance abuse presenting with back pain for the past week.  States that is worse when he is twisting or lifting heavy things. he denies any new trauma or falls.  States that pain is just there in his back, is not radiating, not tearing or sharp.  He denies any urinary symptoms.  He denies any history of kidney stones.  He denies any fevers or new weakness or numbness.  No incontinence.  Has not taken anything for the pain, states that the back pain improves when he is taking hot showers and using a heating pad.  On independent review of his prior office visits, patient has history of nonischemic cardiomyopathy, had a stress test in 2022 that was normal.  At last echo in 2022, normal EF, noted mild dilatation of the aortic root at 40 mm.     Physical Exam   Triage Vital Signs: ED Triage Vitals  Encounter Vitals Group     BP 01/25/23 0815 (!) 182/121     Systolic BP Percentile --      Diastolic BP Percentile --      Pulse Rate 01/25/23 0815 80     Resp 01/25/23 0815 16     Temp 01/25/23 0815 98.2 F (36.8 C)     Temp Source 01/25/23 0815 Oral     SpO2 01/25/23 0815 100 %     Weight 01/25/23 0818 160 lb (72.6 kg)     Height 01/25/23 0818 5\' 11"  (1.803 m)     Head Circumference --      Peak Flow --      Pain Score 01/25/23 0818 10     Pain Loc --      Pain Education --      Exclude from Growth Chart --     Most recent vital signs: Vitals:   01/25/23 0815  BP: (!) 182/121  Pulse: 80  Resp: 16  Temp: 98.2 F (36.8 C)  SpO2: 100%     General: Awake, no distress.  CV:  Good peripheral perfusion.  Resp:  Normal effort.  Abd:  No distention.  Other:  No midline spinal tenderness, no saddle anesthesia, no  CVA tenderness bilaterally, he has focal tenderness to palpation to his left upper paralumbar region over the muscle.  There is no overlying rash, no ecchymoses, swelling, erythema.   ED Results / Procedures / Treatments   Labs (all labs ordered are listed, but only abnormal results are displayed) Labs Reviewed  CBC - Abnormal; Notable for the following components:      Result Value   RBC 4.01 (*)    Hemoglobin 12.5 (*)    HCT 37.3 (*)    All other components within normal limits  COMPREHENSIVE METABOLIC PANEL     RADIOLOGY Independent interpretation of CT imaging, no obvious kidney stone noted.   PROCEDURES:  Critical Care performed: No  Procedures   MEDICATIONS ORDERED IN ED: Medications  ketorolac (TORADOL) 15 MG/ML injection 15 mg (has no administration in time range)  acetaminophen (TYLENOL) tablet 1,000 mg (has no administration in time range)  lidocaine (LIDODERM) 5 % 1 patch (has no administration in time range)     IMPRESSION /  MDM / ASSESSMENT AND PLAN / ED COURSE  I reviewed the triage vital signs and the nursing notes.                              Differential diagnosis includes, but is not limited to, muscle strain, cramp, contusion, considered kidney stones, no urinary symptoms to suggest UTI or [place], no CVA tenderness either.  Also considered aortic etiology but was able to elicit point tenderness on exam, pain is nonradiating, not sharp or tearing, this is less likely a cause of his pain.  No midline spinal tenderness or trauma history to suggest fracture, considered but doubt epidural abscess since no fever, no midline spinal tenderness, considered but doubt cauda equina given that he has no saddle anesthesia, no incontinence, no midline spinal tenderness or focal neurodeficits.   Patient's presentation is most consistent with acute complicated illness / injury requiring diagnostic workup.  Patient presenting with focal muscle pain to his upper  paralumbar region, CT renal stone without any nephrolithiasis, no inflammatory process identified within the pelvis or abdomen, also noted no aortic dilatation.  Independent review of labs, no leukocytosis, platelets are normal, no electrolyte derangements, creatinine is normal.  Ordered for Tylenol, Toradol, Lidoderm patch.  Shared decision making done with patient and he is agreeable with plan for discharge, 11 follow-up with primary care doctor in 2 to 3 days for reassessment, will discharge him with Lidoderm patches.  Instructed him to take Tylenol and ibuprofen every 6 hours for 2 days for the pain.  Considered but no indication for additional workup at this time, he is safe for outpatient management.  Will discharge with strict return precautions.  Clinical Course as of 01/25/23 0921  Thu Jan 25, 2023  0914 CT Renal Stone Study IMPRESSION: 1. No nephroureterolithiasis or obstructive uropathy. 2. No acute inflammatory process identified within the abdomen or pelvis. 3. Multiple other nonacute observations, as described above.   [TT]    Clinical Course User Index [TT] Jodie Echevaria Franchot Erichsen, MD     FINAL CLINICAL IMPRESSION(S) / ED DIAGNOSES   Final diagnoses:  Acute left-sided low back pain without sciatica  Muscle strain     Rx / DC Orders   ED Discharge Orders          Ordered    lidocaine (LIDODERM) 5 %  Every 24 hours        01/25/23 0919             Note:  This document was prepared using Dragon voice recognition software and may include unintentional dictation errors.     Claybon Jabs, MD 01/25/23 [phone]

## 2023-01-25 NOTE — Discharge Instructions (Addendum)
Please take Tylenol 500 mg or ibuprofen 400 mg every 6 hours for 2 days for the pain.  You can take those medications every 6 hours as needed for pain after.  You can also use Salonpas as a patch for the pain.  Please return to the emergency department if you have any weakness, numbness, if the pain is persistent and getting more severe, if you have any fever, lightheadedness, if you pass out, if you fall, or if you have any additional concerns.

## 2023-02-05 ENCOUNTER — Ambulatory Visit: Payer: MEDICAID | Admitting: Family Medicine

## 2023-02-05 ENCOUNTER — Encounter: Payer: Self-pay | Admitting: Family Medicine

## 2023-02-05 VITALS — BP 143/100 | HR 89

## 2023-02-05 DIAGNOSIS — F1911 Other psychoactive substance abuse, in remission: Secondary | ICD-10-CM | POA: Diagnosis not present

## 2023-02-05 DIAGNOSIS — M79672 Pain in left foot: Secondary | ICD-10-CM | POA: Diagnosis not present

## 2023-02-05 DIAGNOSIS — M79671 Pain in right foot: Secondary | ICD-10-CM

## 2023-02-05 DIAGNOSIS — M545 Low back pain, unspecified: Secondary | ICD-10-CM | POA: Diagnosis not present

## 2023-02-05 DIAGNOSIS — R7303 Prediabetes: Secondary | ICD-10-CM | POA: Diagnosis not present

## 2023-02-05 DIAGNOSIS — M2141 Flat foot [pes planus] (acquired), right foot: Secondary | ICD-10-CM

## 2023-02-05 DIAGNOSIS — E782 Mixed hyperlipidemia: Secondary | ICD-10-CM

## 2023-02-05 DIAGNOSIS — I10 Essential (primary) hypertension: Secondary | ICD-10-CM

## 2023-02-05 DIAGNOSIS — G8929 Other chronic pain: Secondary | ICD-10-CM

## 2023-02-05 DIAGNOSIS — M2142 Flat foot [pes planus] (acquired), left foot: Secondary | ICD-10-CM

## 2023-02-05 MED ORDER — LOSARTAN POTASSIUM 50 MG PO TABS: 50.0000 mg | ORAL_TABLET | Freq: Every day | ORAL | 3 refills | Status: DC

## 2023-02-05 NOTE — Progress Notes (Signed)
Established patient visit   Patient: Bryan Herring   DOB: 1960-04-06   63 y.o. Male  MRN: [id number] Visit Date: 02/05/2023  Today's healthcare provider: Shirlee Latch, MD   Chief Complaint  Patient presents with   Follow-up   Subjective    Leg Pain      Discussed the use of AI scribe software for clinical note transcription with the patient, who gave verbal consent to proceed.  History of Present Illness   A 63 year old patient with a history of HFrEF, hypertension, hyperlipidemia, prediabetes, and a history of substance abuse presents for a routine follow-up. The patient is currently on carvedilol 12.5mg  BID, losartan 25mg  daily, and Crestor 5mg  daily. The patient reports ongoing back pain, which he attributes to his work conditions. He describes his job as physically demanding, requiring frequent bending and lifting. He also reports chronic foot pain, which he attributes to his flat feet and the hard floors at his workplace. He has tried various shoe inserts and foot soaks, but these have not provided significant relief. The patient also reports that his blood pressure has been running high recently. He denies any changes in his medications and has no other new concerns or symptoms.         Medications: Outpatient Medications Prior to Visit  Medication Sig   acetaminophen (TYLENOL) 650 MG CR tablet Take 650 mg by mouth every 8 (eight) hours as needed for pain.   carvedilol (COREG) 12.5 MG tablet Take 1 tablet (12.5 mg total) by mouth 2 (two) times daily with a meal.   methocarbamol (ROBAXIN-750) 750 MG tablet Take 1 tablet (750 mg total) by mouth every 6 (six) hours as needed for muscle spasms.   rosuvastatin (CRESTOR) 5 MG tablet Take 1 tablet (5 mg total) by mouth daily.   [DISCONTINUED] losartan (COZAAR) 25 MG tablet Take 1 tablet (25 mg total) by mouth daily.   [DISCONTINUED] lidocaine (LIDODERM) 5 % Place 1 patch onto the skin daily. Remove & Discard  patch within 12 hours or as directed by MD (Patient not taking: Reported on 02/05/2023)   No facility-administered medications prior to visit.    Review of Systems     Objective    BP (!) 143/100 (Cuff Size: Normal)   Pulse 89   SpO2 100%    Physical Exam Vitals reviewed.  Constitutional:      General: He is not in acute distress.    Appearance: Normal appearance. He is not diaphoretic.  HENT:     Head: Normocephalic and atraumatic.  Eyes:     General: No scleral icterus.    Conjunctiva/sclera: Conjunctivae normal.  Cardiovascular:     Rate and Rhythm: Normal rate and regular rhythm.     Heart sounds: Normal heart sounds. No murmur heard. Pulmonary:     Effort: Pulmonary effort is normal. No respiratory distress.     Breath sounds: Normal breath sounds. No wheezing or rhonchi.  Musculoskeletal:     Cervical back: Neck supple.     Right lower leg: No edema.     Left lower leg: No edema.  Lymphadenopathy:     Cervical: No cervical adenopathy.  Skin:    General: Skin is warm and dry.     Findings: No rash.  Neurological:     Mental Status: He is alert and oriented to person, place, and time. Mental status is at baseline.  Psychiatric:        Mood and Affect: Mood normal.  Behavior: Behavior normal.      No results found for any visits on 02/05/23.  Assessment & Plan     Problem List Items Addressed This Visit       Cardiovascular and Mediastinum   Essential hypertension - Primary   Relevant Medications   losartan (COZAAR) 50 MG tablet     Other   Chronic bilateral low back pain without sciatica   Hyperlipidemia   Relevant Medications   losartan (COZAAR) 50 MG tablet   Other Relevant Orders   Lipid panel   Prediabetes   Relevant Orders   Hemoglobin A1c   History of substance abuse (HCC)   Other Visit Diagnoses       Flat feet       Relevant Orders   Ambulatory referral to Podiatry     Foot pain, bilateral       Relevant Orders    Ambulatory referral to Podiatry          Hypertension Hypertension remains suboptimally controlled with current regimen. Blood pressure readings consistently around 140/100 mmHg. Discussed increasing losartan from 25 mg to 50 mg daily to improve control. Patient agreed to the change. - Increase losartan to 50 mg daily - Recheck blood pressure in one month  Heart Failure with Reduced Ejection Fraction (HFrEF) Well-managed on current medications. No new symptoms reported. - Continue carvedilol 12.5 mg BID - increase losartan to 50 mg daily as above  Hyperlipidemia Currently managed with Crestor. No new concerns reported. - Continue Crestor 5 mg daily - Check lipid panel with labs  Prediabetes Monitoring with regular A1c checks. No new symptoms reported. - Check A1c with labs  Muscle Spasm Reported muscle spasms likely due to occupational strain from repetitive bending and lifting. No evidence of kidney stones on recent CT scan. Pain managed with over-the-counter Tylenol Arthritis. Discussed the importance of ergonomic adjustments at work and potential benefits of physical therapy. Patient expressed concerns about cost but was informed that insurance covers physical therapy. - Recommend ergonomic adjustments at work - Consider physical therapy referral if symptoms persist - Continue Tylenol Arthritis as needed  Flat Feet with Associated Pain Chronic foot pain exacerbated by standing and walking on hard surfaces at work. Current insoles and footwear not providing adequate relief. Discussed the need for cushioned insoles and potential referral to podiatry for further evaluation. Patient expressed interest in memory foam inserts and was advised to look for thick, cushioned options online. - Recommend memory foam shoe inserts - Refer to podiatry for further evaluation and management  General Health Maintenance Due for second dose of shingles vaccine. Discussed importance of completing  the series for optimal protection. Explained that the first dose provides about 50% effectiveness, while completing the series increases protection to about 95%. Patient agreed to receive the second dose at the next visit. - Administer second dose of shingles vaccine at next visit  Follow-up - Follow-up in one month for blood pressure check and shingles vaccine - Order labs for A1c and lipid panel.       Return in about 4 weeks (around 03/05/2023) for BP f/u, shingrix #2.       Shirlee Latch, MD  [facility] [phone] (phone) [phone] (fax)  [facility]

## 2023-02-06 ENCOUNTER — Encounter: Payer: Self-pay | Admitting: Family Medicine

## 2023-02-06 LAB — LIPID PANEL
Chol/HDL Ratio: 2.6 {ratio} (ref 0.0–5.0)
Cholesterol, Total: 158 mg/dL (ref 100–199)
HDL: 61 mg/dL (ref >39)
LDL Chol Calc (NIH): 79 mg/dL (ref 0–99)
Triglycerides: 96 mg/dL (ref 0–149)
VLDL Cholesterol Cal: 18 mg/dL (ref 5–40)

## 2023-02-06 LAB — HEMOGLOBIN A1C
Est. average glucose Bld gHb Est-mCnc: 120 mg/dL
Hgb A1c MFr Bld: 5.8 % — ABNORMAL HIGH (ref 4.8–5.6)

## 2023-02-27 ENCOUNTER — Encounter: Payer: Self-pay | Admitting: Podiatry

## 2023-02-27 ENCOUNTER — Ambulatory Visit (INDEPENDENT_AMBULATORY_CARE_PROVIDER_SITE_OTHER): Payer: MEDICAID | Admitting: Podiatry

## 2023-02-27 DIAGNOSIS — B351 Tinea unguium: Secondary | ICD-10-CM

## 2023-02-27 DIAGNOSIS — M2142 Flat foot [pes planus] (acquired), left foot: Secondary | ICD-10-CM | POA: Diagnosis not present

## 2023-02-27 DIAGNOSIS — M2141 Flat foot [pes planus] (acquired), right foot: Secondary | ICD-10-CM | POA: Diagnosis not present

## 2023-03-05 ENCOUNTER — Ambulatory Visit: Payer: MEDICAID | Admitting: Family Medicine

## 2023-03-12 NOTE — Progress Notes (Signed)
   Subjective:  63 y.o. male presenting today for follow-up evaluation of pain and tenderness is associated to flatfoot deformity.  Patient states that he has fallen arches.  They are very painful. He presents for further treatment and evaluation.  He is also requesting a nail trim.  Patient states that his nails are very painful and tender and he is unable to trim his own nails.   Past Medical History:  Diagnosis Date   Deaf    left ear   Deformity    R 5th finger   Essential hypertension 03/01/2018   HFrEF (heart failure with reduced ejection fraction) (HCC)    Hyperlipidemia    Substance abuse (HCC)        Objective/Physical Exam General: The patient is alert and oriented x3 in no acute distress.  Dermatology: Skin is warm, dry and supple bilateral lower extremities. Negative for open lesions or macerations.  Hyperkeratotic discolored elongated dystrophic nails noted 1-5 bilateral  Vascular: Palpable pedal pulses bilaterally. No edema or erythema noted. Capillary refill within normal limits.  Neurological: Epicritic and protective threshold grossly intact bilaterally.   Musculoskeletal Exam: Range of motion within normal limits to all pedal and ankle joints bilateral. Muscle strength 5/5 in all groups bilateral.  Upon weightbearing there is a medial longitudinal arch collapse bilaterally. Remove foot valgus noted to the bilateral lower extremities with excessive pronation upon mid stance.  Radiographic Exam:  Normal osseous mineralization. Joint spaces preserved. No fracture/dislocation/boney destruction.   Pes planus noted on radiographic exam lateral views. Decreased calcaneal inclination and metatarsal declination angle is noted. Anterior break in the cyma line noted on lateral views. Medial talar head to deviation noted on AP radiograph.   Assessment: 1. pes planus bilateral 2.  Pain due to onychomycosis of toenails both   Plan of Care:  1. Patient was evaluated.  X-Rays reviewed.  2.  In regards to the flatfoot deformity of the foot, we did discuss different treatment options which included both surgery and conservative treatment including better arch supports.  Patient states that the power step insoles that were dispensed here were not helpful in alleviating any of his symptoms. 3.  Recommend going to the shoe market on [address] in [place] for diabetic type Plastizote insoles 4.  Mechanical debridement of nails 1-5 bilateral was performed using a nail nipper without incident or bleeding 5.  Return to clinic as needed   Felecia Shelling, DPM [facility]  Dr. Felecia Shelling, DPM    [address]                                        [place], [place] [address]                Office [phone]  Fax [phone]

## 2023-04-09 ENCOUNTER — Ambulatory Visit: Payer: MEDICAID | Admitting: Family Medicine

## 2023-04-24 ENCOUNTER — Emergency Department
Admission: EM | Admit: 2023-04-24 | Discharge: 2023-04-24 | Disposition: A | Discharge status: Home / Self Care | Payer: MEDICAID | Attending: Emergency Medicine | Admitting: Emergency Medicine

## 2023-04-24 ENCOUNTER — Other Ambulatory Visit: Payer: Self-pay

## 2023-04-24 DIAGNOSIS — M79672 Pain in left foot: Secondary | ICD-10-CM | POA: Diagnosis not present

## 2023-04-24 DIAGNOSIS — M79671 Pain in right foot: Secondary | ICD-10-CM | POA: Insufficient documentation

## 2023-04-24 MED ORDER — KETOROLAC TROMETHAMINE 30 MG/ML IJ SOLN
30.0000 mg | Freq: Once | INTRAMUSCULAR | Status: AC
  Administered 2023-04-24: 30 mg via INTRAMUSCULAR
  Filled 2023-04-24: qty 1

## 2023-04-24 NOTE — ED Notes (Addendum)
 Pt resting in room, NAD at this time

## 2023-04-24 NOTE — ED Notes (Signed)
 Pt stating bilateral foot pain, has calluses on bottom of feet due to not having any arch in feet. States the foot dr wanted to remove calluses however pt refused saying it would be to painful and they would grow back anyway.

## 2023-04-24 NOTE — ED Triage Notes (Signed)
 Pt to ED via POV from home. Pt reports hx of arthritis in both feet and has been having increasing pain. Pt seen by foot and ankle in [place]. Pt reports has special inserts but stands on a concrete floor all day at work.

## 2023-04-24 NOTE — Discharge Instructions (Signed)
 Follow up with podiatry. Alternate ibuprofen and tylenol for pain as needed  Flat feet may be treated with: Rest and ice. Physical therapy. This may include stretching and strengthening exercises. These exercises are done to: Help with movement and strength in your foot. Lessen pain. An orthotic insert for one foot or both feet. An insert is something you put in your shoe. It helps to support the arch of your foot. It can be bought at a store or made just for you. Wearing shoes with good arch support. Make sure you wear the right shoes if you're an athlete. Medicines. These may be taken by mouth or given as a shot into your foot. A brace, boot, or cast on your ankle, foot, or leg. Surgery. This may be done to help with the alignment of your foot. It's needed only if your posterior tibial tendon is torn or if you have tarsal coalition.

## 2023-04-24 NOTE — ED Provider Notes (Cosign Needed Addendum)
 [facility] Emergency Department Provider Note     Event Date/Time   First MD Initiated Contact with Patient 04/24/23 1250     (approximate)   History   Foot Pain   HPI  Bryan Herring is a 63 y.o. male presents to the ED for evaluation of bilateral foot pain when standing.  He reports the pain is only on the bottom of his feet. Denies injury or trauma.  Patient follows podiatry for bilateral pes planus and reports his inserts are no longer helping him.     Physical Exam   Triage Vital Signs: ED Triage Vitals  Encounter Vitals Group     BP 04/24/23 1238 (!) 166/118     Systolic BP Percentile --      Diastolic BP Percentile --      Pulse Rate 04/24/23 1238 81     Resp 04/24/23 1238 20     Temp 04/24/23 1239 98.2 F (36.8 C)     Temp Source 04/24/23 1239 Oral     SpO2 04/24/23 1238 98 %     Weight --      Height --      Head Circumference --      Peak Flow --      Pain Score 04/24/23 1239 10     Pain Loc --      Pain Education --      Exclude from Growth Chart --     Most recent vital signs: Vitals:   04/24/23 1239 04/24/23 1449  BP:  (!) 157/92  Pulse:  74  Resp:  18  Temp: 98.2 F (36.8 C) 98.4 F (36.9 C)  SpO2:  99%    General Awake, no distress.  HEENT NCAT. PERRL. EOMI.  CV:  Good peripheral perfusion.  RESP:  Normal effort.  ABD:  No distention.  Other:  Obvious bilateral pes planus. Tenderness to palpation bilaterally to plantar aspect. Neurovascular status intact all throughout.  Moderate calluses on plantar aspect.  F ROM of all digits and ankle joints. Gait is steady with discomfort.   ED Results / Procedures / Treatments   Labs (all labs ordered are listed, but only abnormal results are displayed) Labs Reviewed - No data to display  No results found.  PROCEDURES:  Critical Care performed: No  Procedures   MEDICATIONS ORDERED IN ED: Medications  ketorolac (TORADOL) 30 MG/ML injection 30 mg (30  mg Intramuscular Given 04/24/23 1446)     IMPRESSION / MDM / ASSESSMENT AND PLAN / ED COURSE  I reviewed the triage vital signs and the nursing notes.                               63 y.o. male presents to the emergency department for evaluation and treatment of chronic foot pain. See HPI for further details.   Differential diagnosis includes, but is not limited to pes planus, plantar fasciitis, arthritis  Patient's presentation is most consistent with acute complicated illness / injury requiring diagnostic workup.  Patient is alert and oriented.  He is hemodynamically stable.  Physical exam findings are stated above.  No indication for a life threatening presentation at this time.   Chart reviewed.  Patient had office visit 02/27/2023 with podiatry for pes planus of both feet.  X-rays performed.   We discussed thoruroghly of different treatment options including possible callus removal in which will need to be evaluated  by his podiatrist. Pt was very adamant of not having his calluses removed because of pain and needing to be put under for procedure.  I offered him a IM Toradol injection however I did discuss that ultimately he will need management by a podiatrist.  Patient has a scheduled appointment for Monday.  Patient verbalized understanding.  Work note requested and provided for limited standing activity until podiatry assessment. He is in stable condition for discharge home.  ED return precautions are discussed.  All questions and concerns were addressed during this ED visit.   FINAL CLINICAL IMPRESSION(S) / ED DIAGNOSES   Final diagnoses:  Foot pain, bilateral     Rx / DC Orders   ED Discharge Orders     None        Note:  This document was prepared using Dragon voice recognition software and may include unintentional dictation errors.    Alvaro Augusta A, PA-C 04/24/23 1737    Phyllis Breeze, Bodee Lafoe A, PA-C 04/24/23 1738    Kandee Orion, MD 04/25/23 [phone]

## 2023-04-30 ENCOUNTER — Ambulatory Visit: Payer: MEDICAID | Admitting: Podiatry

## 2023-05-03 ENCOUNTER — Ambulatory Visit: Payer: Self-pay | Attending: Medical | Admitting: Medical

## 2023-05-03 NOTE — Progress Notes (Deleted)
  Cardiology Office Note:  .   Date:  05/03/2023  ID:  Bryan Herring, DOB 11-06-1960, MRN [id number] PCP: Mazie Speed, MD  [facility] Providers Cardiologist:  Constancia Delton, MD { Click to update primary MD,subspecialty MD or APP then REFRESH:1}   History of Present Illness: .   Bryan Herring is a 63 y.o. male with a history of polysubstance abuse including heroin and cocaine for 30+ years,HFimpEF, hypertension, hyperlipidemia, left ear deafness who presents for follow-up for nonischemic cardiomyopathy.  He was evaluated in 2020 for follow-up of syncopal event with recommendation for echo and cardiac monitor which she declined.  Echo in 2021 during admission for heroin overdose showed an EF of 40 to 45%, global hypokinesis, grade 1 diastolic dysfunction, normal RV SF and mild TR.  Discharge summary indicates a diagnosis of A-fib with RVR.  He reestablished with cardiology in September 2022, at which time he reported he stopped using illicit substances in 04/2020.  In the setting of prior cardiomyopathy he underwent Lexiscan  Myoview  in October 2022 that showed no evidence of ischemia and was overall low risk with an EF of 55 to 65%.  No significant coronary artery calcification noted.  Echo from November 2022 showed EF of 50 to 55%, no wall motion abnormality, grade 2 diastolic dysfunction, normal RV SF, mild MR, mild to moderate TR, mildly dilated aortic root measuring 40 mm.  The patient was last seen in September 2024 and was stable from a cardiac perspective  ROS: ***  Studies Reviewed: .        *** Risk Assessment/Calculations:   {Does this patient have ATRIAL FIBRILLATION?:[id number]} No BP recorded.  {Refresh Note OR Click here to enter BP  :1}***       Physical Exam:   VS:  There were no vitals taken for this visit.   Wt Readings from Last 3 Encounters:  01/25/23 160 lb (72.6 kg)  10/03/22 165 lb 3.2 oz (74.9 kg)  08/15/22 164 lb 6.4 oz (74.6  kg)    GEN: Well nourished, well developed in no acute distress NECK: No JVD; No carotid bruits CARDIAC: ***RRR, no murmurs, rubs, gallops RESPIRATORY:  Clear to auscultation without rales, wheezing or rhonchi  ABDOMEN: Soft, non-tender, non-distended EXTREMITIES:  No edema; No deformity   ASSESSMENT AND PLAN: .   ***    {Are you ordering a CV Procedure (e.g. stress test, cath, DCCV, TEE, etc)?   Press F2        :[id number]}  Dispo: ***  Signed, Nancy Manuele Rebekah Canada, PA-C

## 2023-05-21 DIAGNOSIS — Z419 Encounter for procedure for purposes other than remedying health state, unspecified: Secondary | ICD-10-CM | POA: Diagnosis not present

## 2023-06-14 ENCOUNTER — Emergency Department
Admission: EM | Admit: 2023-06-14 | Discharge: 2023-06-14 | Disposition: A | Discharge status: Home / Self Care | Attending: Emergency Medicine | Admitting: Emergency Medicine

## 2023-06-14 ENCOUNTER — Emergency Department

## 2023-06-14 DIAGNOSIS — T7840XA Allergy, unspecified, initial encounter: Secondary | ICD-10-CM | POA: Insufficient documentation

## 2023-06-14 DIAGNOSIS — R220 Localized swelling, mass and lump, head: Secondary | ICD-10-CM | POA: Diagnosis not present

## 2023-06-14 DIAGNOSIS — I110 Hypertensive heart disease with heart failure: Secondary | ICD-10-CM | POA: Insufficient documentation

## 2023-06-14 DIAGNOSIS — S40262A Insect bite (nonvenomous) of left shoulder, initial encounter: Secondary | ICD-10-CM | POA: Insufficient documentation

## 2023-06-14 DIAGNOSIS — I509 Heart failure, unspecified: Secondary | ICD-10-CM | POA: Diagnosis not present

## 2023-06-14 DIAGNOSIS — R531 Weakness: Secondary | ICD-10-CM | POA: Insufficient documentation

## 2023-06-14 DIAGNOSIS — F17210 Nicotine dependence, cigarettes, uncomplicated: Secondary | ICD-10-CM | POA: Insufficient documentation

## 2023-06-14 DIAGNOSIS — W57XXXA Bitten or stung by nonvenomous insect and other nonvenomous arthropods, initial encounter: Secondary | ICD-10-CM | POA: Insufficient documentation

## 2023-06-14 DIAGNOSIS — Z91038 Other insect allergy status: Secondary | ICD-10-CM

## 2023-06-14 DIAGNOSIS — R058 Other specified cough: Secondary | ICD-10-CM | POA: Diagnosis not present

## 2023-06-14 LAB — URINALYSIS, ROUTINE W REFLEX MICROSCOPIC
Bacteria, UA: NONE SEEN
Bilirubin Urine: NEGATIVE
Glucose, UA: NEGATIVE mg/dL
Ketones, ur: NEGATIVE mg/dL
Leukocytes,Ua: NEGATIVE
Nitrite: NEGATIVE
Protein, ur: NEGATIVE mg/dL
Specific Gravity, Urine: 1.006 (ref 1.005–1.030)
Squamous Epithelial / HPF: 0 /HPF (ref 0–5)
pH: 7 (ref 5.0–8.0)

## 2023-06-14 LAB — CBC WITH DIFFERENTIAL/PLATELET
Abs Immature Granulocytes: 0.01 10*3/uL (ref 0.00–0.07)
Basophils Absolute: 0 10*3/uL (ref 0.0–0.1)
Basophils Relative: 0 %
Eosinophils Absolute: 0.2 10*3/uL (ref 0.0–0.5)
Eosinophils Relative: 3 %
HCT: 38.5 % — ABNORMAL LOW (ref 39.0–52.0)
Hemoglobin: 13.1 g/dL (ref 13.0–17.0)
Immature Granulocytes: 0 %
Lymphocytes Relative: 46 %
Lymphs Abs: 3.1 10*3/uL (ref 0.7–4.0)
MCH: 31 pg (ref 26.0–34.0)
MCHC: 34 g/dL (ref 30.0–36.0)
MCV: 91 fL (ref 80.0–100.0)
Monocytes Absolute: 0.5 10*3/uL (ref 0.1–1.0)
Monocytes Relative: 7 %
Neutro Abs: 3 10*3/uL (ref 1.7–7.7)
Neutrophils Relative %: 44 %
Platelets: 199 10*3/uL (ref 150–400)
RBC: 4.23 MIL/uL (ref 4.22–5.81)
RDW: 12.6 % (ref 11.5–15.5)
WBC: 6.8 10*3/uL (ref 4.0–10.5)
nRBC: 0 % (ref 0.0–0.2)

## 2023-06-14 LAB — COMPREHENSIVE METABOLIC PANEL WITH GFR
ALT: 16 U/L (ref 0–44)
AST: 18 U/L (ref 15–41)
Albumin: 3.8 g/dL (ref 3.5–5.0)
Alkaline Phosphatase: 64 U/L (ref 38–126)
Anion gap: 9 (ref 5–15)
BUN: 13 mg/dL (ref 8–23)
CO2: 25 mmol/L (ref 22–32)
Calcium: 9 mg/dL (ref 8.9–10.3)
Chloride: 105 mmol/L (ref 98–111)
Creatinine, Ser: 0.96 mg/dL (ref 0.61–1.24)
GFR, Estimated: >60 mL/min (ref >60)
Glucose, Bld: 84 mg/dL (ref 70–99)
Potassium: 3.6 mmol/L (ref 3.5–5.1)
Sodium: 139 mmol/L (ref 135–145)
Total Bilirubin: 0.6 mg/dL (ref 0.0–1.2)
Total Protein: 7.3 g/dL (ref 6.5–8.1)

## 2023-06-14 LAB — BRAIN NATRIURETIC PEPTIDE: B Natriuretic Peptide: 77.7 pg/mL (ref 0.0–100.0)

## 2023-06-14 MED ORDER — PREDNISONE 20 MG PO TABS
60.0000 mg | ORAL_TABLET | Freq: Once | ORAL | Status: AC
  Administered 2023-06-14: 60 mg via ORAL
  Filled 2023-06-14: qty 3

## 2023-06-14 MED ORDER — PREDNISONE 50 MG PO TABS: 50.0000 mg | ORAL_TABLET | Freq: Every day | ORAL | 0 refills | Status: AC

## 2023-06-14 NOTE — ED Triage Notes (Signed)
 Pt presents to the ED via POV from home for bilateral eye swelling. Pt states that he woke up with it. Pt denies known allergies. Denies cough, fevers, shob, or nasal congestion. Pt has hx of htn and has not taken his morning BP medication.

## 2023-06-14 NOTE — Discharge Instructions (Signed)
 You have been diagnosed with allergic reaction to insect bite.  Please take prednisone 1 tablet with breakfast in the next 3 days.  Please come back to ED or go to your PCP if you have new symptoms symptoms worsen.  Please go to your appointment with your PCP in 2 weeks.

## 2023-06-14 NOTE — ED Provider Notes (Signed)
 [facility] Provider Note    Event Date/Time   First MD Initiated Contact with Patient 06/14/23 1804     (approximate)   History   eye swelling    HPI  Bryan Herring is a 63 y.o. male    with a past medical history of acute left-sided low back pain without sciatica, heart failure with improved with ejection fraction, essential hypertension, mixed hyperlipidemia, smoking, arthritis, acute kidney injury, who presents to the ED complaining of bilateral palpebral edema. According to the patient, he woke up this morning with bilateral bipedal edema, was difficult to open his eyes.  Patient states having productive cough, increased urinary frequency.  Patient states having history of smoking 10 cigarettes/day in the last several years.  Patient denies fever, lip edema, shortness of breath,vomit, chest pain, wheezing, diarrhea, urinary symptoms.  Patient states he found this morning in the left shoulder area insect bite, stated to itchiness.       Physical Exam   Triage Vital Signs: ED Triage Vitals [06/14/23 1730]  Encounter Vitals Group     BP (!) 186/120     Systolic BP Percentile      Diastolic BP Percentile      Pulse Rate 89     Resp 18     Temp 98.3 F (36.8 C)     Temp Source Oral     SpO2 98 %     Weight 160 lb (72.6 kg)     Height 5\' 11"  (1.803 m)     Head Circumference      Peak Flow      Pain Score 0     Pain Loc      Pain Education      Exclude from Growth Chart     Most recent vital signs: Vitals:   06/14/23 1730  BP: (!) 186/120  Pulse: 89  Resp: 18  Temp: 98.3 F (36.8 C)  SpO2: 98%     Constitutional: Alert, NAD. Able to speak in complete sentences without cough or dyspnea.  Hypertensive during triage. Eyes: Conjunctivae are normal.  Bilateral palpebral edema more prominent in the lower eyelid.  Head: Atraumatic. Nose: No congestion/rhinnorhea. Mouth/Throat: Mucous membranes are moist.   Neck: Painless ROM.  Supple. No JVD, nodes, thyromegaly  Cardiovascular:   Good peripheral circulation.RRR no murmurs, gallops, rubs  Respiratory: Normal respiratory effort.  No retractions.  Mobilization of secretions, no wheezing. Gastrointestinal: Soft and nontender.  Musculoskeletal:  no deformity, lower extremities without edema Neurologic:  MAE spontaneously. No gross focal neurologic deficits are appreciated.  Skin:  Skin is warm, dry and intact. No rash noted.  No other insect bites noted. Psychiatric: Mood and affect are normal. Speech and behavior are normal.    ED Results / Procedures / Treatments   Labs (all labs ordered are listed, but only abnormal results are displayed) Labs Reviewed  CBC WITH DIFFERENTIAL/PLATELET - Abnormal; Notable for the following components:      Result Value   HCT 38.5 (*)    All other components within normal limits  URINALYSIS, ROUTINE W REFLEX MICROSCOPIC - Abnormal; Notable for the following components:   Color, Urine STRAW (*)    APPearance CLEAR (*)    Hgb urine dipstick SMALL (*)    All other components within normal limits  BRAIN NATRIURETIC PEPTIDE  COMPREHENSIVE METABOLIC PANEL WITH GFR     EKG See physician read    RADIOLOGY I independently reviewed and interpreted imaging and  agree with radiologists findings.      PROCEDURES:  Critical Care performed:   Procedures   MEDICATIONS ORDERED IN ED: Medications  predniSONE (DELTASONE) tablet 60 mg (60 mg Oral Given 06/14/23 2002)   Clinical Course as of 06/14/23 2018  Thu Jun 14, 2023  1921 CBC with Differential(!) White blood cells, red blood cells and platelets within normal limits [AE]  1939 CBC with Differential(!) [AE]  1939 Comprehensive metabolic panel Electrolytes, renal function and liver function within normal limits [AE]  1955 Brain natriuretic peptide Within normal limits [AE]  1955 Urinalysis, Routine w reflex microscopic -Urine, Clean Catch(!) Moderate weakness present,  no bacteria, no nitrates no leukocytes [AE]  2005 Assessed the patient, patient states feeling better after taking prednisone.  Updated patient with results of labs ruling out decompensated heart failure, UTI.  Patient is going to be discharged with prednisone, patient is agreeable with the plan [AE]    Clinical Course User Index [AE] Awilda Lennox, PA-C    IMPRESSION / MDM / ASSESSMENT AND PLAN / ED COURSE  I reviewed the triage vital signs and the nursing notes.  Differential diagnosis includes, but is not limited to, allergic reaction to insect bite, compensated heart failure, COPD, UTI  Patient's presentation is most consistent with acute complicated illness / injury requiring diagnostic workup.   Bryan Todorov Hemingwayis a 62 y.o.male present today with bilateral palpebral edema.  Patient has history of heart failure, order CBC CMP and BNP to rule out decompensated heart failure.  Physical exam presence of insect bite in the left shoulder.  Ordered prednisone 60 mg, patient states decrease in the palpebral edema.  Patient's diagnosis is consistent with allergic reaction to insect bite. . Labs are  reassuring. I did review the patient's allergies and medications.The patient is in stable and satisfactory condition for discharge home  Patient will be discharged home with prescriptions for prednisone. Patient is to follow up with PCP in 2 weeks as needed or otherwise directed. Patient is given ED precautions to return to the ED for any worsening or new symptoms. Discussed plan of care with patient, answered all of patient's questions, Patient agreeable to plan of care. Advised patient to take medications according to the instructions on the label. Discussed possible side effects of new medications. Patient verbalized understanding.  FINAL CLINICAL IMPRESSION(S) / ED DIAGNOSES   Final diagnoses:  Allergic reaction to insect bite     Rx / DC Orders   ED Discharge Orders           Ordered    predniSONE (DELTASONE) 50 MG tablet  Daily with breakfast        06/14/23 2018             Note:  This document was prepared using Dragon voice recognition software and may include unintentional dictation errors.   Awilda Lennox, PA-C 06/14/23 2019    Bradler, Evan K, MD 06/15/23 1911

## 2023-06-21 DIAGNOSIS — Z419 Encounter for procedure for purposes other than remedying health state, unspecified: Secondary | ICD-10-CM | POA: Diagnosis not present

## 2023-06-26 ENCOUNTER — Ambulatory Visit: Admitting: Family Medicine

## 2023-07-21 DIAGNOSIS — Z419 Encounter for procedure for purposes other than remedying health state, unspecified: Secondary | ICD-10-CM | POA: Diagnosis not present

## 2023-07-23 ENCOUNTER — Ambulatory Visit: Admitting: Family Medicine

## 2023-07-23 ENCOUNTER — Encounter: Payer: Self-pay | Admitting: Family Medicine

## 2023-07-23 VITALS — BP 161/112 | HR 70 | Resp 14 | Ht 71.0 in | Wt 167.9 lb

## 2023-07-23 DIAGNOSIS — Z23 Encounter for immunization: Secondary | ICD-10-CM | POA: Diagnosis not present

## 2023-07-23 DIAGNOSIS — R7303 Prediabetes: Secondary | ICD-10-CM

## 2023-07-23 DIAGNOSIS — F1911 Other psychoactive substance abuse, in remission: Secondary | ICD-10-CM

## 2023-07-23 DIAGNOSIS — E782 Mixed hyperlipidemia: Secondary | ICD-10-CM

## 2023-07-23 DIAGNOSIS — I10 Essential (primary) hypertension: Secondary | ICD-10-CM

## 2023-07-23 DIAGNOSIS — K089 Disorder of teeth and supporting structures, unspecified: Secondary | ICD-10-CM

## 2023-07-23 MED ORDER — CARVEDILOL 12.5 MG PO TABS: 12.5000 mg | ORAL_TABLET | Freq: Two times a day (BID) | ORAL | 1 refills | Status: DC

## 2023-07-23 MED ORDER — ROSUVASTATIN CALCIUM 5 MG PO TABS: 5.0000 mg | ORAL_TABLET | Freq: Every day | ORAL | 3 refills | Status: DC

## 2023-07-23 MED ORDER — LOSARTAN POTASSIUM 50 MG PO TABS
50.0000 mg | ORAL_TABLET | Freq: Every day | ORAL | 3 refills | Status: DC
Start: 2023-07-23 — End: 2023-10-09

## 2023-07-23 NOTE — Progress Notes (Unsigned)
 Established patient visit   Patient: Bryan Herring   DOB: Sep 21, 1960   63 y.o. Male  MRN: [id number] Visit Date: 07/23/2023  Today's healthcare provider: Jon Eva, MD   Chief Complaint  Patient presents with  . Medication Refill    Ran out a few months   Subjective    Medication Refill   HPI     Medication Refill    Additional comments: Ran out a few months      Last edited by Wilfred Hargis RAMAN, CMA on 07/23/2023  3:25 PM.       Discussed the use of AI scribe software for clinical note transcription with the patient, who gave verbal consent to proceed.  History of Present Illness             Medications: Outpatient Medications Prior to Visit  Medication Sig  . acetaminophen  (TYLENOL ) 650 MG CR tablet Take 650 mg by mouth every 8 (eight) hours as needed for pain.  . carvedilol  (COREG ) 12.5 MG tablet Take 1 tablet (12.5 mg total) by mouth 2 (two) times daily with a meal.  . losartan  (COZAAR ) 50 MG tablet Take 1 tablet (50 mg total) by mouth daily.  . methocarbamol  (ROBAXIN -750) 750 MG tablet Take 1 tablet (750 mg total) by mouth every 6 (six) hours as needed for muscle spasms.  . rosuvastatin  (CRESTOR ) 5 MG tablet Take 1 tablet (5 mg total) by mouth daily.   No facility-administered medications prior to visit.    Review of Systems {Insert previous labs (optional):23779} {See past labs  Heme  Chem  Endocrine  Serology  Results Review (optional):1}   Objective    BP (!) 161/112 (BP Location: Left Arm, Patient Position: Sitting, Cuff Size: Normal)   Pulse 70   Resp 14   Ht 5' 11 (1.803 m)   Wt 167 lb 14.4 oz (76.2 kg)   SpO2 100%   BMI 23.42 kg/m  {Insert last BP/Wt (optional):23777}{See vitals history (optional):1}  Physical Exam Vitals reviewed.  Constitutional:      General: He is not in acute distress.    Appearance: Normal appearance. He is not diaphoretic.  HENT:     Head: Normocephalic and atraumatic.  Eyes:      General: No scleral icterus.    Conjunctiva/sclera: Conjunctivae normal.  Cardiovascular:     Rate and Rhythm: Normal rate and regular rhythm.     Heart sounds: Normal heart sounds. No murmur heard. Pulmonary:     Effort: Pulmonary effort is normal. No respiratory distress.     Breath sounds: Normal breath sounds. No wheezing or rhonchi.  Musculoskeletal:     Cervical back: Neck supple.     Right lower leg: No edema.     Left lower leg: No edema.  Lymphadenopathy:     Cervical: No cervical adenopathy.  Skin:    General: Skin is warm and dry.     Findings: No rash.  Neurological:     Mental Status: He is alert and oriented to person, place, and time. Mental status is at baseline.  Psychiatric:        Mood and Affect: Mood normal.        Behavior: Behavior normal.      No results found for any visits on 07/23/23.  Assessment & Plan     Problem List Items Addressed This Visit       Cardiovascular and Mediastinum   Essential hypertension - Primary  Other   Hyperlipidemia   Prediabetes                No follow-ups on file.       Jon Eva, MD  [facility] [phone] (phone) [phone] (fax)  [facility]

## 2023-07-24 NOTE — Assessment & Plan Note (Signed)
 Hyperlipidemia is managed with Crestor  (rosuvastatin ) 5 mg daily. - Refill Crestor  5 mg daily at [facility].

## 2023-07-24 NOTE — Assessment & Plan Note (Signed)
-   Recheck at next visit

## 2023-07-24 NOTE — Assessment & Plan Note (Signed)
 Hypertension is currently uncontrolled due to non-adherence to medication regimen. Blood pressure is elevated today. He has been off medications for a few months. - Refill carvedilol  12.5 mg twice daily and losartan  50 mg daily at [facility]. - Advise to avoid sodium-rich foods and use low-sodium seasonings. - Encourage consumption of fruits and vegetables, and avoidance of canned and frozen meals with high sodium content. - Plan to re-evaluate blood pressure in 1-2 months during physical examination. - Ensure he is on medications prior to cardiology appointment on July 30.

## 2023-08-03 ENCOUNTER — Telehealth: Payer: Self-pay | Admitting: *Deleted

## 2023-08-03 NOTE — Progress Notes (Signed)
 Complex Care Management Note Care Guide Note  08/03/2023 Name: Natalie Leclaire MRN: [id number] DOB: [birth year]   Complex Care Management Outreach Attempts: An unsuccessful telephone outreach was attempted today to offer the patient information about available complex care management services.  Follow Up Plan:  Additional outreach attempts will be made to offer the patient complex care management information and services.   Encounter Outcome:  No Answer  [facility] HealthPopulation Health Care Guide  Direct Dial:[phone] [phone] Website: [email or web address]

## 2023-08-07 ENCOUNTER — Telehealth: Payer: Self-pay | Admitting: *Deleted

## 2023-08-07 NOTE — Progress Notes (Signed)
 Complex Care Management Note Care Guide Note  08/07/2023 Name: [name] MRN: [id number] DOB: 02-22-1960  Bryan Herring is a 63 y.o. year old male who is a primary care patient of Bacigalupo, Jon HERO, MD . The community resource team was consulted for assistance with Transportation Needs , Food Insecurity, and Dentists  SDOH screenings and interventions completed:  Yes  Social Drivers of Health From This Encounter   Food Insecurity: Food Insecurity Present (08/07/2023)   Hunger Vital Sign    Worried About Running Out of Food in the Last Year: Sometimes true    Ran Out of Food in the Last Year: Sometimes true  Housing: High Risk (08/07/2023)   Housing Stability Vital Sign    Unable to Pay for Housing in the Last Year: Yes    Number of Times Moved in the Last Year: 0    Homeless in the Last Year: No  Financial Resource Strain: High Risk (08/07/2023)   Overall Financial Resource Strain (CARDIA)    Difficulty of Paying Living Expenses: Hard  Utilities: Not At Risk (08/07/2023)   Utilities    Threatened with loss of utilities: No    SDOH Interventions Today    Flowsheet Row Most Recent Value  SDOH Interventions   Food Insecurity Interventions Community Resources Provided  Housing Interventions Community Resources Provided  [employer] in [place] has  a need for funding additional]  Utilities Interventions Intervention Not Indicated  Financial Strain Interventions Community Resources Provided  [Dental school provided]     Care guide performed the following interventions: Patient provided with information about care guide support team and interviewed to confirm resource needs.  Follow Up Plan:  No further follow up planned at this time. The patient has been provided with needed resources.  Encounter Outcome:  Patient Visit Completed  Bryan Herring  [facility] HealthPopulation Health Care Guide  Direct Dial:[phone] Fax:[phone] Website:  [email or web address]

## 2023-08-07 NOTE — Progress Notes (Unsigned)
 Cardiology Office Note    Date:  08/08/2023   ID:  Bryan Herring, DOB 05/21/1960, MRN [id number]  PCP:  Myrla Jon HERO, MD  Cardiologist:  Redell Cave, MD  Electrophysiologist:  None   Chief Complaint: Follow-up  History of Present Illness:   Bryan Herring is a 63 y.o. male with history of polysubstance abuse including heroin and cocaine for 30+ years, HFimpEF, HTN, HLD, and left ear deafness who presents for follow-up of NICM.   He was previously evaluated in 2020 for follow-up of syncopal event with recommendation for echo and a cardiac monitor (which he declined).  Echo was subsequently obtained in 2021 during admission for heroin overdose which showed an EF of 40 to 45%, global hypokinesis, grade 1 diastolic dysfunction, normal RV systolic function and ventricular cavity size, and mild tricuspid regurgitation.  Discharge summary indicates diagnosis of A-fib with RVR, EKG is unable to be interpreted as such given significant underlying motion artifact.   He reestablished care with cardiology in 09/2020, at which time he reported he stopped using illicit substances in 04/2020, after checking into a rehab program.  In the setting of his prior cardiomyopathy, he underwent Lexiscan  MPI in 10/2020 that showed no evidence of ischemia and was overall low risk with an EF of 55 to 65%.  No significant coronary artery calcification noted.  Echo from 11/2020 showed an EF of 50 to 55%, no regional wall motion abnormalities, grade 2 diastolic dysfunction, normal RV systolic function and ventricular cavity size, mild mitral regurgitation, mild to moderate tricuspid regurgitation, and a mildly dilated aortic root measuring 40 mm.  He was last seen in the office in 09/2022 and was without symptoms of angina or cardiac decompensation.  He continued to abstain from illicit substances.  He continued to smoke approximately half pack daily and was not ready to quit.  He comes in  continuing to do well from a cardiac perspective and is without symptoms of angina or cardiac decompensation.  No dyspnea, dizziness, presyncope, or syncope.  No lower extremity swelling, abdominal distention, orthopnea.  No falls or symptoms concerning for bleeding.  He continues to abstain from illicit substances and alcohol.  He continued to smoke approximately 1/2 pack daily and has nicotine patches at home, though is not yet ready to use them.  He is adherent and tolerating cardiac medications.  He is trying to follow a heart healthy diet, though does love cold cut meats, cheeses, and sausage.  Overall he feels well at this time and does not have any acute cardiac concerns.   Labs independently reviewed: 06/2023 - Hgb 13.1, PLT 199, potassium 3.6, BUN 13, serum creatinine 0.96, albumin 3.8, AST/ALT normal, BNP 77 01/2023 - A1c 5.8, TC 158, TG 96, HDL 61, LDL 79 05/2022 - TSH normal  Past Medical History:  Diagnosis Date   Deaf    left ear   Deformity    R 5th finger   Essential hypertension 03/01/2018   HFrEF (heart failure with reduced ejection fraction) (HCC)    Hyperlipidemia    Substance abuse (HCC)     Past Surgical History:  Procedure Laterality Date   ACHILLES TENDON SURGERY Left    COLONOSCOPY WITH PROPOFOL  N/A 01/20/2021   Procedure: COLONOSCOPY WITH PROPOFOL ;  Surgeon: Therisa Bi, MD;  Location: [facility];  Service: Gastroenterology;  Laterality: N/A;    Current Medications: Current Meds  Medication Sig   carvedilol  (COREG ) 12.5 MG tablet Take 1 tablet (12.5 mg total) by  mouth 2 (two) times daily with a meal.   losartan  (COZAAR ) 50 MG tablet Take 1 tablet (50 mg total) by mouth daily.   methocarbamol  (ROBAXIN -750) 750 MG tablet Take 1 tablet (750 mg total) by mouth every 6 (six) hours as needed for muscle spasms.   rosuvastatin  (CRESTOR ) 5 MG tablet Take 1 tablet (5 mg total) by mouth daily.    Allergies:   No known allergies   Social History   Socioeconomic  History   Marital status: Single    Spouse name: Not on file   Number of children: 2   Years of education: Not on file   Highest education level: Not on file  Occupational History   Not on file  Tobacco Use   Smoking status: Every Day    Current packs/day: 0.25    Average packs/day: 0.3 packs/day for 45.0 years (11.3 ttl pk-yrs)    Types: Cigarettes   Smokeless tobacco: Never   Tobacco comments:    08/16/21 Patient no longer at [facility], now resided at [facility] in [place]    Patient wearing nicotine patch  Vaping Use   Vaping status: Never Used  Substance and Sexual Activity   Alcohol use: Not Currently    Comment: last use April 14, 2020   Drug use: Not Currently    Types: Marijuana, Heroin, Cocaine    Comment: last use April 14, 2020   Sexual activity: Not Currently  Other Topics Concern   Not on file  Social History Narrative   Not on file   Social Drivers of Health   Financial Resource Strain: High Risk (08/07/2023)   Overall Financial Resource Strain (CARDIA)    Difficulty of Paying Living Expenses: Hard  Food Insecurity: Food Insecurity Present (08/07/2023)   Hunger Vital Sign    Worried About Running Out of Food in the Last Year: Sometimes true    Ran Out of Food in the Last Year: Sometimes true  Transportation Needs: Unmet Transportation Needs (07/19/2021)   PRAPARE - Administrator, Civil Service (Medical): Yes    Lack of Transportation (Non-Medical): Yes  Physical Activity: Not on file  Stress: Not on file  Social Connections: Not on file     Family History:  The patient's family history includes Alcohol abuse in his son; Aneurysm in his mother; Anuerysm in his maternal aunt; Hypertension in his brother, maternal aunt, and sister; Other in his father, maternal grandfather, maternal grandmother, paternal grandfather, and paternal grandmother. There is no history of Colon cancer, Prostate cancer, or Breast cancer.  ROS:   12-point review of systems is  negative unless otherwise noted in the HPI.   EKGs/Labs/Other Studies Reviewed:    Studies reviewed were summarized above. The additional studies were reviewed today:  2D echo 11/10/2020: 1. Left ventricular ejection fraction, by estimation, is 50 to 55%. The  left ventricle has low normal function. The left ventricle has no regional  wall motion abnormalities. Left ventricular diastolic parameters are  consistent with Grade II diastolic  dysfunction (pseudonormalization).   2. Right ventricular systolic function is normal. The right ventricular  size is normal.   3. Right atrial size was mildly dilated.   4. The mitral valve is normal in structure. Mild mitral valve  regurgitation.   5. Tricuspid valve regurgitation is mild to moderate.   6. The aortic valve is tricuspid. Aortic valve regurgitation is not  visualized.   7. Aortic dilatation noted. There is mild dilatation of the aortic  root,  measuring 40 mm.   8. The inferior vena cava is normal in size with greater than 50%  respiratory variability, suggesting right atrial pressure of 3 mmHg.   Comparison(s): EF 40 %; AOR 32mm.  __________   Lexiscan  MPI 10/13/2020:   The study is normal. The study is low risk.   The left ventricular ejection fraction is normal (55-65%). End diastolic cavity size is normal.   There is no evidence for sichemia   no significant coronary artery calcifications noted __________   2D echo 02/04/2019: 1. Left ventricular ejection fraction, by visual estimation, is 40 to  45%. The left ventricle has moderately decreased function. There is no  left ventricular hypertrophy.   2. Left ventricular diastolic parameters are consistent with Grade I  diastolic dysfunction (impaired relaxation).   3. The left ventricle demonstrates global hypokinesis.   4. Global right ventricle has normal systolic function.The right  ventricular size is normal. No increase in right ventricular wall  thickness.   5. Left  atrial size was normal.   6. Right atrial size was normal.   7. The mitral valve is normal in structure. No evidence of mitral valve  regurgitation. No evidence of mitral stenosis.   8. The tricuspid valve is normal in structure.   9. The tricuspid valve is normal in structure. Tricuspid valve  regurgitation is mild.  10. The aortic valve is normal in structure. Aortic valve regurgitation is  not visualized. No evidence of aortic valve sclerosis or stenosis.  11. The pulmonic valve was normal in structure. Pulmonic valve  regurgitation is not visualized.  12. The inferior vena cava is dilated in size with >50% respiratory  variability, suggesting right atrial pressure of 8 mmHg.  13. No prior echocardiogram.    EKG:  EKG is ordered today.  The EKG ordered today demonstrates NSR, 81 bpm, nonspecific ST-T changes  Recent Labs: 06/14/2023: ALT 16; B Natriuretic Peptide 77.7; BUN 13; Creatinine, Ser 0.96; Hemoglobin 13.1; Platelets 199; Potassium 3.6; Sodium 139  Recent Lipid Panel    Component Value Date/Time   CHOL 158 02/05/2023 1400   TRIG 96 02/05/2023 1400   HDL 61 02/05/2023 1400   CHOLHDL 2.6 02/05/2023 1400   LDLCALC 79 02/05/2023 1400    PHYSICAL EXAM:    VS:  BP (!) 136/98   Pulse 81   Ht 5' 11 (1.803 m)   Wt 169 lb (76.7 kg)   SpO2 97%   BMI 23.57 kg/m   BMI: Body mass index is 23.57 kg/m.  Physical Exam Vitals reviewed.  Constitutional:      Appearance: He is well-developed.  HENT:     Head: Normocephalic and atraumatic.  Eyes:     General:        Right eye: No discharge.        Left eye: No discharge.  Cardiovascular:     Rate and Rhythm: Normal rate and regular rhythm.     Heart sounds: Normal heart sounds, S1 normal and S2 normal. Heart sounds not distant. No midsystolic click and no opening snap. No murmur heard.    No friction rub.  Pulmonary:     Effort: Pulmonary effort is normal. No respiratory distress.     Breath sounds: Normal breath sounds.  No decreased breath sounds, wheezing, rhonchi or rales.  Musculoskeletal:     Cervical back: Normal range of motion.     Right lower leg: No edema.     Left lower leg: No  edema.  Skin:    General: Skin is warm and dry.     Nails: There is no clubbing.  Neurological:     Mental Status: He is alert and oriented to person, place, and time.  Psychiatric:        Speech: Speech normal.        Behavior: Behavior normal.        Thought Content: Thought content normal.        Judgment: Judgment normal.     Wt Readings from Last 3 Encounters:  08/08/23 169 lb (76.7 kg)  07/23/23 167 lb 14.4 oz (76.2 kg)  06/14/23 160 lb (72.6 kg)     ASSESSMENT & PLAN:   HFimpEF secondary to NICM: He is doing well and without symptoms concerning for angina or cardiac decompensation with NYHA class II symptoms.  Cardiomyopathy felt to be likely secondary to polysubstance abuse including cocaine and heroin.  Most recent echo showed low normal LV systolic function.  Not requiring a standing loop diuretic.  Remains on losartan  50 mg and carvedilol  12.5 mg twice daily.  Given lack of heart failure symptoms, and in the context of improved LV systolic function, defer further escalation of GDMT at this time.  Dilated aortic root: Measuring 40 mm by echo in 11/2020.  Order has been placed for follow-up echo prior to office visit next year.  Optimal blood pressure control recommended.  HTN: Blood pressure is mildly elevated in the office today.  He remains on carvedilol  12.5 mg twice daily and losartan  50 mg daily.  Suspect this is in the setting of increased sodium intake.  Recommend he minimize sodium.  HLD: LDL 79 in 01/2023.  He remains on rosuvastatin  5 mg.  Tobacco use: Complete cessation is encouraged.    Disposition: F/u with Dr. Darliss or an APP in 12 months.   Medication Adjustments/Labs and Tests Ordered: Current medicines are reviewed at length with the patient today.  Concerns regarding  medicines are outlined above. Medication changes, Labs and Tests ordered today are summarized above and listed in the Patient Instructions accessible in Encounters.   Signed, Bernardino Bring, PA-C 08/08/2023 4:53 PM     [facility] [place] [address] [place], [place] [address] [phone]

## 2023-08-08 ENCOUNTER — Encounter: Payer: Self-pay | Admitting: Physician Assistant

## 2023-08-08 ENCOUNTER — Ambulatory Visit: Attending: Physician Assistant | Admitting: Physician Assistant

## 2023-08-08 VITALS — BP 136/98 | HR 81 | Ht 71.0 in | Wt 169.0 lb

## 2023-08-08 DIAGNOSIS — I5032 Chronic diastolic (congestive) heart failure: Secondary | ICD-10-CM | POA: Insufficient documentation

## 2023-08-08 DIAGNOSIS — Z720 Tobacco use: Secondary | ICD-10-CM | POA: Insufficient documentation

## 2023-08-08 DIAGNOSIS — I77810 Thoracic aortic ectasia: Secondary | ICD-10-CM | POA: Insufficient documentation

## 2023-08-08 DIAGNOSIS — I10 Essential (primary) hypertension: Secondary | ICD-10-CM | POA: Diagnosis not present

## 2023-08-08 DIAGNOSIS — E782 Mixed hyperlipidemia: Secondary | ICD-10-CM | POA: Insufficient documentation

## 2023-08-08 NOTE — Patient Instructions (Signed)
 Medication Instructions:  Your physician recommends that you continue on your current medications as directed. Please refer to the Current Medication list given to you today.   *If you need a refill on your cardiac medications before your next appointment, please call your pharmacy*  Lab Work: None ordered at this time  If you have labs (blood work) drawn today and your tests are completely normal, you will receive your results only by: MyChart Message (if you have MyChart) OR A paper copy in the mail If you have any lab test that is abnormal or we need to change your treatment, we will call you to review the results.  Testing/Procedures: Your physician has requested that you have an echocardiogram in 11 to 12 months. Echocardiography is a painless test that uses sound waves to create images of your heart. It provides your doctor with information about the size and shape of your heart and how well your heart's chambers and valves are working.   You may receive an ultrasound enhancing agent through an IV if needed to better visualize your heart during the echo. This procedure takes approximately one hour.  There are no restrictions for this procedure.  This will take place at [address] [facility] [address], [place] [address]  Please note: We ask at that you not bring children with you during ultrasound (echo/ vascular) testing. Due to room size and safety concerns, children are not allowed in the ultrasound rooms during exams. Our front office staff cannot provide observation of children in our lobby area while testing is being conducted. An adult accompanying a patient to their appointment will only be allowed in the ultrasound room at the discretion of the ultrasound technician under special circumstances. We apologize for any inconvenience.   Follow-Up: At [facility], you and your health needs are our priority.  As part of our continuing mission to provide you  with exceptional heart care, our providers are all part of one team.  This team includes your primary Cardiologist (physician) and Advanced Practice Providers or APPs (Physician Assistants and Nurse Practitioners) who all work together to provide you with the care you need, when you need it.  Your next appointment:   1 year(s) (after your Echo)  Provider:   You may see Redell Cave, MD or Bernardino Bring, PA-C

## 2023-08-21 DIAGNOSIS — Z419 Encounter for procedure for purposes other than remedying health state, unspecified: Secondary | ICD-10-CM | POA: Diagnosis not present

## 2023-09-21 DIAGNOSIS — Z419 Encounter for procedure for purposes other than remedying health state, unspecified: Secondary | ICD-10-CM | POA: Diagnosis not present

## 2023-09-27 ENCOUNTER — Encounter: Admitting: Family Medicine

## 2023-10-01 ENCOUNTER — Encounter: Admitting: Family Medicine

## 2023-10-04 ENCOUNTER — Encounter: Payer: Self-pay | Admitting: Family Medicine

## 2023-10-04 ENCOUNTER — Encounter: Admitting: Family Medicine

## 2023-10-04 ENCOUNTER — Ambulatory Visit: Admitting: Family Medicine

## 2023-10-04 VITALS — BP 169/123 | HR 88 | Temp 98.9°F | Ht 71.0 in | Wt 166.2 lb

## 2023-10-04 DIAGNOSIS — J069 Acute upper respiratory infection, unspecified: Secondary | ICD-10-CM

## 2023-10-04 LAB — POCT INFLUENZA A/B
Influenza A, POC: NEGATIVE
Influenza B, POC: NEGATIVE

## 2023-10-04 LAB — POC COVID19 BINAXNOW: SARS Coronavirus 2 Ag: NEGATIVE

## 2023-10-04 NOTE — Progress Notes (Signed)
 Acute visit   Patient: Bryan Herring   DOB: [birth year]   63 y.o. Male  MRN: [id number] PCP: Myrla Jon HERO, MD   Chief Complaint  Patient presents with   Acute Visit    Patient reports yesterday he began feeling bad with runny nose, cough w/ yellow phlegm, sore throat. Using ricola and dayquil. Would need work note as well.    Subjective    Discussed the use of AI scribe software for clinical note transcription with the patient, who gave verbal consent to proceed.  History of Present Illness   Bryan Herring is a 63 year old male who presents with symptoms of a respiratory infection.  Symptoms began Tuesday night and worsened by Wednesday morning, including rhinorrhea, productive cough with yellow sputum, pharyngitis, and throat swelling. He feels weak and drowsy, likely due to DayQuil, NyQuil, and tea with honey. He has been unable to work since Wednesday and requests a doctor's note for his absence.  He suspects a fever earlier in the week but is uncertain. He continues to feel unwell despite using over-the-counter medications and home remedies like hot showers and chicken soup. He lacks appetite.  He has a significant smoking history of over 40 years and noticed a wheeze yesterday. No shortness of breath. He has been abstinent from drugs and alcohol for over three years. He has attempted smoking cessation with patches and lozenges but continues to smoke, especially with morning coffee.        Review of Systems  Objective    BP (!) 169/123 (BP Location: Left Arm, Patient Position: Sitting, Cuff Size: Normal)   Pulse 88   Temp 98.9 F (37.2 C) (Oral)   Ht 5' 11 (1.803 m)   Wt 166 lb 3.2 oz (75.4 kg)   SpO2 97%   BMI 23.18 kg/m   Physical Exam Vitals reviewed.  Constitutional:      General: He is not in acute distress.    Appearance: Normal appearance. He is not diaphoretic.  HENT:     Head: Normocephalic and atraumatic.  Eyes:      General: No scleral icterus.    Conjunctiva/sclera: Conjunctivae normal.  Cardiovascular:     Rate and Rhythm: Normal rate and regular rhythm.     Heart sounds: Normal heart sounds. No murmur heard. Pulmonary:     Effort: Pulmonary effort is normal. No respiratory distress.     Breath sounds: Wheezing (slight) present. No rhonchi.  Musculoskeletal:     Cervical back: Neck supple.     Right lower leg: No edema.     Left lower leg: No edema.  Lymphadenopathy:     Cervical: No cervical adenopathy.  Skin:    General: Skin is warm and dry.     Findings: No rash.  Neurological:     Mental Status: He is alert and oriented to person, place, and time. Mental status is at baseline.  Psychiatric:        Mood and Affect: Mood normal.        Behavior: Behavior normal.       Results for orders placed or performed in visit on 10/04/23  POC COVID-19  Result Value Ref Range   SARS Coronavirus 2 Ag Negative Negative  POCT Influenza A/B  Result Value Ref Range   Influenza A, POC Negative Negative   Influenza B, POC Negative Negative    Assessment & Plan     Problem List Items Addressed This Visit  None Visit Diagnoses       Viral URI with cough    -  Primary   Relevant Orders   POC COVID-19 (Completed)   POCT Influenza A/B (Completed)          Acute viral upper respiratory infection Acute viral upper respiratory infection with symptoms of rhinorrhea, productive cough with yellow sputum, pharyngitis, and wheezing. COVID-19 and influenza tests are negative, indicating another viral etiology. Symptoms began on Tuesday night, worsening by Wednesday morning. No fever currently, but possible fever earlier in the week. No dyspnea reported. - Continue symptomatic treatment with DayQuil, NyQuil, cough drops, tea, and honey. - Use acetaminophen  for headache, fever, or pain as needed. - Use BC powder sparingly for myalgia and headaches, avoiding frequent use to prevent ulcers. - Provide  work note excusing absence from 9/24 to 10/07/2023. - If wheezing increases or cough worsens, consider treatment for COPD exacerbation.  Tobacco use Chronic tobacco use contributing to respiratory symptoms, including wheezing. Acknowledges difficulty in smoking cessation and has attempted cessation with patches and lozenges. - Encourage smoking cessation using available patches and lozenges. - Discuss the importance of smoking cessation for overall health and respiratory improvement.       No orders of the defined types were placed in this encounter.    Return if symptoms worsen or fail to improve.      Jon Eva, MD  [facility] [phone] (phone) [phone] (fax)  [facility]

## 2023-10-09 ENCOUNTER — Ambulatory Visit (INDEPENDENT_AMBULATORY_CARE_PROVIDER_SITE_OTHER): Admitting: Family Medicine

## 2023-10-09 ENCOUNTER — Encounter: Payer: Self-pay | Admitting: Family Medicine

## 2023-10-09 VITALS — BP 167/103 | HR 89 | Ht 71.0 in | Wt 168.5 lb

## 2023-10-09 DIAGNOSIS — E782 Mixed hyperlipidemia: Secondary | ICD-10-CM | POA: Diagnosis not present

## 2023-10-09 DIAGNOSIS — Z0000 Encounter for general adult medical examination without abnormal findings: Secondary | ICD-10-CM | POA: Diagnosis not present

## 2023-10-09 DIAGNOSIS — I10 Essential (primary) hypertension: Secondary | ICD-10-CM | POA: Diagnosis not present

## 2023-10-09 DIAGNOSIS — R7303 Prediabetes: Secondary | ICD-10-CM | POA: Diagnosis not present

## 2023-10-09 MED ORDER — LOSARTAN POTASSIUM 100 MG PO TABS: 100.0000 mg | ORAL_TABLET | Freq: Every day | ORAL | 1 refills | Status: DC

## 2023-10-09 NOTE — Progress Notes (Signed)
 Complete physical exam   Patient: Bryan Herring   DOB: [birth year]   63 y.o. 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 male who presents today for a complete physical exam.   Discussed the use of AI scribe software for clinical note transcription with the patient, who gave verbal consent to proceed.  History of Present Illness   Bryan Herring is a 63 year old male who presents for an annual physical exam.  He has a persistent cough with yellow phlegm, which he associates with cigarette smoking. The cough began after a recent illness. He denies shortness of breath.  He takes carvedilol  12.5 mg twice daily and losartan  50 mg daily for hypertension, and rosuvastatin  5 mg daily for hyperlipidemia.  He has received pneumonia and shingles vaccinations but is unsure about his last tetanus shot. He declined a flu shot.  He is due for blood work to assess A1c, cholesterol, kidney, and liver function, as these were last checked in January.  He is interested in using cinnamon and beet juice to help manage his blood pressure.        Last depression screening scores    10/09/2023    2:09 PM 07/23/2023    3:29 PM 02/05/2023    1:59 PM  PHQ 2/9 Scores  PHQ - 2 Score 0 0 0  PHQ- 9 Score  0 0   Last fall risk screening    10/09/2023    2:09 PM  Fall Risk   Falls in the past year? 0  Number falls in past yr: 0  Injury with Fall? 0  Risk for fall due to : No Fall Risks  Follow up Falls evaluation completed        Medications: Outpatient Medications Prior to Visit  Medication Sig   acetaminophen  (TYLENOL ) 650  MG CR tablet Take 650 mg by mouth every 8 (eight) hours as needed for pain.   carvedilol  (COREG ) 12.5 MG tablet Take 1 tablet (12.5 mg total) by mouth 2 (two) times daily with a meal.   methocarbamol  (ROBAXIN -750) 750 MG tablet Take 1 tablet (750 mg total) by mouth every 6 (six) hours as needed for muscle spasms.   rosuvastatin  (CRESTOR ) 5 MG tablet Take 1 tablet (5 mg total) by mouth daily.   [DISCONTINUED] losartan  (COZAAR ) 50 MG tablet Take 1 tablet (50 mg total) by mouth daily.   No facility-administered medications prior to visit.    Review of Systems    Objective    BP (!) 167/103 (BP Location: Left Arm, Patient Position: Sitting, Cuff Size: Large)   Pulse 89   Ht 5' 11 (1.803 m)   Wt 168 lb 8 oz (76.4 kg)   SpO2 100%   BMI 23.50 kg/m    Physical Exam Vitals reviewed.  Constitutional:      General: He is not in acute distress.    Appearance: Normal appearance. He is well-developed. He is not diaphoretic.  HENT:     Head: Normocephalic and atraumatic.     Right Ear: Tympanic membrane, ear canal and external  ear normal.     Left Ear: Tympanic membrane, ear canal and external ear normal.     Nose: Nose normal.     Mouth/Throat:     Mouth: Mucous membranes are moist.     Pharynx: Oropharynx is clear. No oropharyngeal exudate.  Eyes:     General: No scleral icterus.    Conjunctiva/sclera: Conjunctivae normal.     Pupils: Pupils are equal, round, and reactive to light.  Neck:     Thyroid : No thyromegaly.  Cardiovascular:     Rate and Rhythm: Normal rate and regular rhythm.     Pulses: Normal pulses.     Heart sounds: Normal heart sounds. No murmur heard. Pulmonary:     Effort: Pulmonary effort is normal. No respiratory distress.     Breath sounds: Normal breath sounds. No wheezing or rales.  Abdominal:     General: There is no distension.     Palpations: Abdomen is soft.     Tenderness: There is no abdominal tenderness.  Musculoskeletal:        General: No  deformity.     Cervical back: Neck supple.     Right lower leg: No edema.     Left lower leg: No edema.  Lymphadenopathy:     Cervical: No cervical adenopathy.  Skin:    General: Skin is warm and dry.     Findings: No rash.  Neurological:     Mental Status: He is alert and oriented to person, place, and time. Mental status is at baseline.     Gait: Gait normal.  Psychiatric:        Mood and Affect: Mood normal.        Behavior: Behavior normal.        Thought Content: Thought content normal.      No results found for any visits on 10/09/23.  Assessment & Plan    Routine Health Maintenance and Physical Exam  Exercise Activities and Dietary recommendations  Goals   None     Immunization History  Administered Date(s) Administered   Influenza,inj,Quad PF,6+ Mos 12/16/2020   PNEUMOCOCCAL CONJUGATE-20 07/23/2023   Zoster Recombinant(Shingrix ) 08/03/2022, 07/23/2023    Health Maintenance  Topic Date Due   DTaP/Tdap/Td (1 - Tdap) Never done   COVID-19 Vaccine (1 - 2024-25 season) Never done   Influenza Vaccine  04/08/2024 (Originally 08/10/2023)   Colonoscopy  01/21/2031   Pneumococcal Vaccine: 50+ Years  Completed   Hepatitis C Screening  Completed   HIV Screening  Completed   Zoster Vaccines- Shingrix   Completed   Hepatitis B Vaccines 19-59 Average Risk  Aged Out   HPV VACCINES  Aged Out   Meningococcal B Vaccine  Aged Out    Discussed health benefits of physical activity, and encouraged him to engage in regular exercise appropriate for his age and condition.  Problem List Items Addressed This Visit       Cardiovascular and Mediastinum   Essential hypertension   Blood pressure elevated today, possibly due to recent smoking and meal. Currently on carvedilol  12.5 mg twice daily and losartan  50 mg daily. Discussed potential use of cinnamon and beet juice as alternative methods to lower blood pressure, though not validated by studies. - Recheck blood pressure after  urination - Consider earlier follow-up if blood pressure remains elevated - Continue carvedilol  12.5 mg twice daily - increase losartan  to 100 mg daily F/u in 1 m      Relevant Medications   losartan  (COZAAR ) 100 MG  tablet   Other Relevant Orders   Comprehensive metabolic panel with GFR   Microalbumin / creatinine urine ratio     Other   Hyperlipidemia   Currently managed with rosuvastatin  5 mg daily. - Continue rosuvastatin  5 mg daily      Relevant Medications   losartan  (COZAAR ) 100 MG tablet   Other Relevant Orders   Comprehensive metabolic panel with GFR   Lipid panel   Prediabetes   Due for A1c testing to monitor blood sugar levels. Last checked in January. - Order A1c test       Relevant Orders   Hemoglobin A1c   Other Visit Diagnoses       Encounter for annual physical exam    -  Primary   Relevant Orders   Hemoglobin A1c   Comprehensive metabolic panel with GFR   Microalbumin / creatinine urine ratio   Lipid panel           Adult Wellness Visit Routine adult wellness visit. Completed pneumonia and shingles vaccinations. Declined flu shot. Tetanus vaccination due as last administration unknown. Discussed tetanus as a serious condition causing muscle lock-up, potentially affecting breathing, contracted from rusty metal or soil. - Administer tetanus vaccination      Return in about 4 weeks (around 11/06/2023) for BP f/u.     Jon Eva, MD  [facility] [phone] (phone) [phone] (fax)  [facility]

## 2023-10-09 NOTE — Assessment & Plan Note (Signed)
 Due for A1c testing to monitor blood sugar levels. Last checked in January. - Order A1c test

## 2023-10-09 NOTE — Assessment & Plan Note (Signed)
 Blood pressure elevated today, possibly due to recent smoking and meal. Currently on carvedilol  12.5 mg twice daily and losartan  50 mg daily. Discussed potential use of cinnamon and beet juice as alternative methods to lower blood pressure, though not validated by studies. - Recheck blood pressure after urination - Consider earlier follow-up if blood pressure remains elevated - Continue carvedilol  12.5 mg twice daily - increase losartan  to 100 mg daily F/u in 1 m

## 2023-10-09 NOTE — Assessment & Plan Note (Signed)
 Currently managed with rosuvastatin  5 mg daily. - Continue rosuvastatin  5 mg daily

## 2023-10-10 LAB — COMPREHENSIVE METABOLIC PANEL WITH GFR
ALT: 11 IU/L (ref 0–44)
AST: 13 IU/L (ref 0–40)
Albumin: 4.3 g/dL (ref 3.9–4.9)
Alkaline Phosphatase: 84 IU/L (ref 47–123)
BUN/Creatinine Ratio: 13 (ref 10–24)
BUN: 13 mg/dL (ref 8–27)
Bilirubin Total: 0.4 mg/dL (ref 0.0–1.2)
CO2: 24 mmol/L (ref 20–29)
Calcium: 9.2 mg/dL (ref 8.6–10.2)
Chloride: 104 mmol/L (ref 96–106)
Creatinine, Ser: 0.99 mg/dL (ref 0.76–1.27)
Globulin, Total: 2.8 g/dL (ref 1.5–4.5)
Glucose: 69 mg/dL — ABNORMAL LOW (ref 70–99)
Potassium: 4.6 mmol/L (ref 3.5–5.2)
Sodium: 141 mmol/L (ref 134–144)
Total Protein: 7.1 g/dL (ref 6.0–8.5)
eGFR: 86 mL/min/1.73 (ref >59)

## 2023-10-10 LAB — LIPID PANEL
Chol/HDL Ratio: 3.5 ratio (ref 0.0–5.0)
Cholesterol, Total: 161 mg/dL (ref 100–199)
HDL: 46 mg/dL (ref >39)
LDL Chol Calc (NIH): 97 mg/dL (ref 0–99)
Triglycerides: 96 mg/dL (ref 0–149)
VLDL Cholesterol Cal: 18 mg/dL (ref 5–40)

## 2023-10-10 LAB — HEMOGLOBIN A1C
Est. average glucose Bld gHb Est-mCnc: 117 mg/dL
Hgb A1c MFr Bld: 5.7 % — ABNORMAL HIGH (ref 4.8–5.6)

## 2023-10-10 LAB — MICROALBUMIN / CREATININE URINE RATIO
Creatinine, Urine: 210.9 mg/dL
Microalb/Creat Ratio: 17 mg/g{creat} (ref 0–29)
Microalbumin, Urine: 35.9 ug/mL

## 2023-10-11 ENCOUNTER — Ambulatory Visit: Payer: Self-pay | Admitting: Family Medicine

## 2023-11-06 ENCOUNTER — Encounter: Payer: Self-pay | Admitting: Family Medicine

## 2023-11-06 ENCOUNTER — Ambulatory Visit: Admitting: Family Medicine

## 2023-11-06 VITALS — BP 152/106 | Ht 71.0 in | Wt 175.2 lb

## 2023-11-06 DIAGNOSIS — R7303 Prediabetes: Secondary | ICD-10-CM | POA: Diagnosis not present

## 2023-11-06 DIAGNOSIS — I10 Essential (primary) hypertension: Secondary | ICD-10-CM | POA: Diagnosis not present

## 2023-11-06 NOTE — Progress Notes (Signed)
 Established patient visit   Patient: Bryan Herring   DOB: Nov 15, 1960   63 y.o. Male  MRN: [id number] Visit Date: 11/06/2023  Today's healthcare provider: Jon Eva, MD   Chief Complaint  Patient presents with   Medical Management of Chronic Issues   Hypertension    He was last seen for hypertension 4 weeks ago.  BP at that visit was 167/103. Management since that visit includes continue carvedilol  12.5 mg twice daily and increase losartan  to 100 mg daily. He reports excellent compliance with treatment. He is not having side effects.  He is following a Low Sodium diet. He is not exercising. He does smoke. Symptoms: wheezing at times.    Subjective    Hypertension   HPI     Hypertension    Additional comments: He was last seen for hypertension 4 weeks ago.  BP at that visit was 167/103. Management since that visit includes continue carvedilol  12.5 mg twice daily and increase losartan  to 100 mg daily. He reports excellent compliance with treatment. He is not having side effects.  He is following a Low Sodium diet. He is not exercising. He does smoke. Symptoms: wheezing at times.       Last edited by Lilian Fitzpatrick, CMA on 11/06/2023  1:36 PM.       Discussed the use of AI scribe software for clinical note transcription with the patient, who gave verbal consent to proceed.  History of Present Illness   Bryan Herring is a 63 year old male with hypertension who presents for a follow-up visit.  He is taking carvedilol  12.5 mg twice daily and losartan  50 mg daily. He has not increased the losartan  dose to 100 mg daily as he was instructed to finish the remaining 50 mg tablets first.  His weight has increased from approximately 160 pounds to 175 pounds, with fluctuations over the past few months. He attributes some changes to being sick in August, which led to weight loss, and is now regaining weight.  He describes his dietary habits, noting he is  not a breakfast person but tries to eat brunch and a healthy dinner. He sometimes consumes ice cream or cereal at night, which can cause early morning hunger.  No current swelling in his legs, although he recalls a past episode of foot swelling. He inquires about dietary triggers for gout, specifically asking about collard greens.        Medications: Outpatient Medications Prior to Visit  Medication Sig   acetaminophen  (TYLENOL ) 650 MG CR tablet Take 650 mg by mouth every 8 (eight) hours as needed for pain.   carvedilol  (COREG ) 12.5 MG tablet Take 1 tablet (12.5 mg total) by mouth 2 (two) times daily with a meal.   losartan  (COZAAR ) 100 MG tablet Take 1 tablet (100 mg total) by mouth daily.   methocarbamol  (ROBAXIN -750) 750 MG tablet Take 1 tablet (750 mg total) by mouth every 6 (six) hours as needed for muscle spasms.   rosuvastatin  (CRESTOR ) 5 MG tablet Take 1 tablet (5 mg total) by mouth daily.   No facility-administered medications prior to visit.    Review of Systems     Objective    BP (!) 152/106 (BP Location: Left Arm, Patient Position: Sitting, Cuff Size: Normal)   Ht 5' 11 (1.803 m)   Wt 175 lb 3.2 oz (79.5 kg)   BMI 24.44 kg/m    Physical Exam Vitals reviewed.  Constitutional:  General: He is not in acute distress.    Appearance: Normal appearance. He is not diaphoretic.  HENT:     Head: Normocephalic and atraumatic.  Eyes:     General: No scleral icterus.    Conjunctiva/sclera: Conjunctivae normal.  Cardiovascular:     Rate and Rhythm: Normal rate and regular rhythm.     Heart sounds: Normal heart sounds. No murmur heard. Pulmonary:     Effort: Pulmonary effort is normal. No respiratory distress.     Breath sounds: Normal breath sounds. No wheezing or rhonchi.  Musculoskeletal:     Cervical back: Neck supple.     Right lower leg: No edema.     Left lower leg: No edema.  Lymphadenopathy:     Cervical: No cervical adenopathy.  Skin:    General:  Skin is warm and dry.     Findings: No rash.  Neurological:     Mental Status: He is alert and oriented to person, place, and time. Mental status is at baseline.  Psychiatric:        Mood and Affect: Mood normal.        Behavior: Behavior normal.      No results found for any visits on 11/06/23.  Assessment & Plan     Problem List Items Addressed This Visit       Cardiovascular and Mediastinum   Essential hypertension - Primary   Hypertension remains elevated despite current treatment with carvedilol  12.5 mg BID and losartan  50 mg daily. Weight has increased to 175 lbs, but this is not a concern given his height and BMI. - Instruct to take two 50 mg losartan  tablets daily until the current supply is exhausted, then switch to 100 mg losartan  daily. - Schedule follow-up appointment in one month to reassess blood pressure on the increased losartan  dose.        Other   Prediabetes   A1c is 5.7%, indicating prediabetes, but it has decreased slightly from the previous measurement. - Advise reducing sugar in coffee from two spoons to one to help lower A1c.          Return in about 4 weeks (around 12/04/2023) for BP f/u.       Jon Eva, MD  [address] [facility] [phone] (phone) [phone] (fax)  [facility]

## 2023-11-06 NOTE — Assessment & Plan Note (Signed)
 Hypertension remains elevated despite current treatment with carvedilol  12.5 mg BID and losartan  50 mg daily. Weight has increased to 175 lbs, but this is not a concern given his height and BMI. - Instruct to take two 50 mg losartan  tablets daily until the current supply is exhausted, then switch to 100 mg losartan  daily. - Schedule follow-up appointment in one month to reassess blood pressure on the increased losartan  dose.

## 2023-11-06 NOTE — Patient Instructions (Signed)
 Managing Your High Blood Pressure For the person with high blood pressure, learn what blood pressure is, what the numbers mean, and what you can do to help keep your blood pressure in a normal range.  To view the content, go to this web address: [email or web address]  This video will expire on: 12/20/2024. If you need access to this video following this date, please reach out to the healthcare provider who assigned it to you. This information is not intended to replace advice given to you by your health care provider. Make sure you discuss any questions you have with your health care provider. Elsevier Patient Education  2024 [employer].

## 2023-11-06 NOTE — Progress Notes (Deleted)
 He was last seen for hypertension 4 weeks ago.  BP at that visit was 167/103. Management since that visit includes continue carvedilol  12.5 mg twice daily and increase losartan  to 100 mg daily. He reports {excellent/good/fair/poor:19665} compliance with treatment. He {is/is not:9024} having side effects. {document side effects if present:1} He is following a {diet:21022986} diet. He {is/is not:9024} exercising. He {does/does not:200015} smoke. Use of agents associated with hypertension: {bp agents assoc with hypertension:511::none}. Outside blood pressures are {***enter patient reported home BP readings, or 'not being checked':1}. Symptoms:

## 2023-11-06 NOTE — Assessment & Plan Note (Signed)
 A1c is 5.7%, indicating prediabetes, but it has decreased slightly from the previous measurement. - Advise reducing sugar in coffee from two spoons to one to help lower A1c.

## 2023-12-17 ENCOUNTER — Ambulatory Visit (INDEPENDENT_AMBULATORY_CARE_PROVIDER_SITE_OTHER): Admitting: Family Medicine

## 2023-12-17 ENCOUNTER — Encounter: Payer: Self-pay | Admitting: Family Medicine

## 2023-12-17 VITALS — BP 156/98 | HR 85 | Temp 98.1°F | Ht 71.0 in | Wt 175.8 lb

## 2023-12-17 DIAGNOSIS — I10 Essential (primary) hypertension: Secondary | ICD-10-CM

## 2023-12-17 MED ORDER — LOSARTAN POTASSIUM 100 MG PO TABS: 100.0000 mg | ORAL_TABLET | Freq: Every day | ORAL | 1 refills | Status: DC

## 2023-12-17 MED ORDER — ROSUVASTATIN CALCIUM 5 MG PO TABS: 5.0000 mg | ORAL_TABLET | Freq: Every day | ORAL | 3 refills | Status: DC

## 2023-12-17 MED ORDER — HYDROCHLOROTHIAZIDE 25 MG PO TABS: 25.0000 mg | ORAL_TABLET | Freq: Every day | ORAL | 3 refills | Status: DC

## 2023-12-17 NOTE — Progress Notes (Signed)
 Established patient visit   Patient: Bryan Herring   DOB: 1960-12-09   63 y.o. Male  MRN: [id number] Visit Date: 12/17/2023  Today's healthcare provider: Jon Eva, MD   Chief Complaint  Patient presents with   Follow-up    Patient is here to follow up 4 week blood pressure due to an increase on medication.  Also has been taking garlic pills.   Subjective    HPI HPI     Follow-up    Additional comments: Patient is here to follow up 4 week blood pressure due to an increase on medication.  Also has been taking garlic pills.      Last edited by Terrel Powell CROME, CMA on 12/17/2023  1:47 PM.       Discussed the use of AI scribe software for clinical note transcription with the patient, who gave verbal consent to proceed.  History of Present Illness   Bryan Herring is a 63 year old male with hypertension who presents for blood pressure management.  His blood pressure today is 156/98 mmHg, similar to prior elevated readings. About 5 to 6 weeks ago his losartan  was increased to 100 mg daily, which he takes as two pills, and he is compliant, including a dose this morning. He also takes a daily garlic pill with his medications and is unsure if it helps.  He recalls prior use of hydrochlorothiazide  but has not taken it for several years. He has some unidentified long pills at home and will bring them in for identification, and he agrees not to take them until then.  He is taking carvedilol  and rosuvastatin  without recent changes. He will be traveling soon and has ensured he has enough of his medications for the trip.         Medications: Outpatient Medications Prior to Visit  Medication Sig   acetaminophen  (TYLENOL ) 650 MG CR tablet Take 650 mg by mouth every 8 (eight) hours as needed for pain.   carvedilol  (COREG ) 12.5 MG tablet Take 1 tablet (12.5 mg total) by mouth 2 (two) times daily with a meal.   methocarbamol  (ROBAXIN -750) 750 MG tablet  Take 1 tablet (750 mg total) by mouth every 6 (six) hours as needed for muscle spasms.   [DISCONTINUED] losartan  (COZAAR ) 100 MG tablet Take 1 tablet (100 mg total) by mouth daily.   [DISCONTINUED] rosuvastatin  (CRESTOR ) 5 MG tablet Take 1 tablet (5 mg total) by mouth daily.   No facility-administered medications prior to visit.    Review of Systems     Objective    BP (!) 156/98 (BP Location: Left Arm, Cuff Size: Large)   Pulse 85   Temp 98.1 F (36.7 C) (Oral)   Ht 5' 11 (1.803 m)   Wt 175 lb 12.8 oz (79.7 kg)   SpO2 100%   BMI 24.52 kg/m    Physical Exam Vitals reviewed.  Constitutional:      General: He is not in acute distress.    Appearance: Normal appearance. He is not diaphoretic.  HENT:     Head: Normocephalic and atraumatic.  Eyes:     General: No scleral icterus.    Conjunctiva/sclera: Conjunctivae normal.  Cardiovascular:     Rate and Rhythm: Normal rate and regular rhythm.     Heart sounds: Normal heart sounds. No murmur heard. Pulmonary:     Effort: Pulmonary effort is normal. No respiratory distress.     Breath sounds: Normal breath sounds. No wheezing or  rhonchi.  Musculoskeletal:     Cervical back: Neck supple.     Right lower leg: No edema.     Left lower leg: No edema.  Lymphadenopathy:     Cervical: No cervical adenopathy.  Skin:    General: Skin is warm and dry.     Findings: No rash.  Neurological:     Mental Status: He is alert and oriented to person, place, and time. Mental status is at baseline.  Psychiatric:        Mood and Affect: Mood normal.        Behavior: Behavior normal.      No results found for any visits on 12/17/23.  Assessment & Plan     Problem List Items Addressed This Visit       Cardiovascular and Mediastinum   Essential hypertension - Primary   Blood pressure remains elevated at 156/98 mmHg despite maximum dose of losartan  (100 mg daily). Previous increase in losartan  dosage was not sufficient to control  blood pressure. Garlic supplements have been used without clear benefit. Hydrochlorothiazide  was previously used but not in recent years. Decision made to add hydrochlorothiazide  to current regimen to enhance blood pressure control. - Continue losartan  100 mg daily. - Added hydrochlorothiazide  25 mg daily. - Continue carvedilol  and rosuvastatin  as previously prescribed. - Sent prescription to [facility]. - Will follow up in one month to reassess blood pressure control.       Relevant Medications   rosuvastatin  (CRESTOR ) 5 MG tablet   losartan  (COZAAR ) 100 MG tablet   hydrochlorothiazide  (HYDRODIURIL ) 25 MG tablet      Return in about 4 weeks (around 01/14/2024) for BP f/u.       Jon Eva, MD  [facility] [phone] (phone) [phone] (fax)  [facility]

## 2023-12-17 NOTE — Assessment & Plan Note (Signed)
 Blood pressure remains elevated at 156/98 mmHg despite maximum dose of losartan  (100 mg daily). Previous increase in losartan  dosage was not sufficient to control blood pressure. Garlic supplements have been used without clear benefit. Hydrochlorothiazide  was previously used but not in recent years. Decision made to add hydrochlorothiazide  to current regimen to enhance blood pressure control. - Continue losartan  100 mg daily. - Added hydrochlorothiazide  25 mg daily. - Continue carvedilol  and rosuvastatin  as previously prescribed. - Sent prescription to [facility]. - Will follow up in one month to reassess blood pressure control.

## 2024-01-14 ENCOUNTER — Encounter: Payer: Self-pay | Admitting: Family Medicine

## 2024-01-14 ENCOUNTER — Ambulatory Visit: Admitting: Family Medicine

## 2024-01-14 ENCOUNTER — Ambulatory Visit: Attending: Family Medicine

## 2024-01-14 VITALS — BP 121/87 | HR 79 | Resp 14 | Ht 71.0 in | Wt 173.2 lb

## 2024-01-14 DIAGNOSIS — R053 Chronic cough: Secondary | ICD-10-CM

## 2024-01-14 DIAGNOSIS — I10 Essential (primary) hypertension: Secondary | ICD-10-CM

## 2024-01-14 DIAGNOSIS — F17200 Nicotine dependence, unspecified, uncomplicated: Secondary | ICD-10-CM

## 2024-01-14 DIAGNOSIS — Z23 Encounter for immunization: Secondary | ICD-10-CM | POA: Diagnosis not present

## 2024-01-14 DIAGNOSIS — R002 Palpitations: Secondary | ICD-10-CM

## 2024-01-14 MED ORDER — ROSUVASTATIN CALCIUM 5 MG PO TABS: 5.0000 mg | ORAL_TABLET | Freq: Every day | ORAL | 5 refills | Status: DC

## 2024-01-14 MED ORDER — LOSARTAN POTASSIUM-HCTZ 100-25 MG PO TABS: 1.0000 | ORAL_TABLET | Freq: Every day | ORAL | 5 refills | Status: DC

## 2024-01-14 NOTE — Progress Notes (Addendum)
 "     Established patient visit   Patient: Bryan Herring   DOB: June 20, 1960   64 y.o. Male  MRN: [id number] Visit Date: 01/14/2024  Today's healthcare provider: Jon Eva, MD   Chief Complaint  Patient presents with   Medical Management of Chronic Issues   Hypertension   Subjective    Hypertension     Discussed the use of AI scribe software for clinical note transcription with the patient, who gave verbal consent to proceed.  History of Present Illness   Bryan Herring is a 64 year old male with hypertension who presents for medication refill and evaluation of palpitations.  His current medications include rosuvastatin  5 mg daily, hydrochlorothiazide  25 mg daily, losartan  100 mg daily, and carvedilol  twice daily. He is running low on these medications and needs refills.  He has intermittent palpitations, most often when lying down or moving quickly, with sudden episodes of heart racing that then calm down. He finds the episodes frightening but infrequent. He has been evaluated by a cardiologist in the past.  He has a persistent cough that he attributes to smoking. He smokes and is trying to cut down. He denies shortness of breath with the cough.  He is due for a tetanus shot. He has had a shingles vaccine several months ago. He denies fever, prior serious vaccine reactions, seizures, Guillain-Barr, cancer, HIV, other immune problems, recent immunosuppressive medications, or blood product transfusions.  He lives in a shared house with several other men and reports frustration with some housemates. He has been clean from substance use for three years.       Medications: Show/hide medication list[1]  Review of Systems     Objective    BP 121/87   Pulse 79   Resp 14   Ht 5' 11 (1.803 m)   Wt 173 lb 3.2 oz (78.6 kg)   SpO2 99%   BMI 24.16 kg/m    Physical Exam Vitals reviewed.  Constitutional:      General: He is not in acute  distress.    Appearance: Normal appearance. He is not diaphoretic.  HENT:     Head: Normocephalic and atraumatic.  Eyes:     General: No scleral icterus.    Conjunctiva/sclera: Conjunctivae normal.  Cardiovascular:     Rate and Rhythm: Normal rate and regular rhythm.     Heart sounds: Normal heart sounds. No murmur heard. Pulmonary:     Effort: Pulmonary effort is normal. No respiratory distress.     Breath sounds: Normal breath sounds. No wheezing or rhonchi.  Musculoskeletal:     Cervical back: Neck supple.     Right lower leg: No edema.     Left lower leg: No edema.  Lymphadenopathy:     Cervical: No cervical adenopathy.  Skin:    General: Skin is warm and dry.     Findings: No rash.  Neurological:     Mental Status: He is alert. Mental status is at baseline.  Psychiatric:        Mood and Affect: Mood normal.        Behavior: Behavior normal.      EKG: NSR  No results found for any visits on 01/14/24.  Assessment & Plan     Problem List Items Addressed This Visit       Cardiovascular and Mediastinum   Essential hypertension - Primary   Blood pressure is well-controlled with current medication regimen. He is compliant with medication and reports  no issues. - Continue current antihypertensive regimen: rosuvastatin  5 mg daily, hydrochlorothiazide  25 mg daily, losartan  100 mg daily, and carvedilol  twice daily - Switched losartan  and hydrochlorothiazide  to a combination pill for convenience      Relevant Medications   rosuvastatin  (CRESTOR ) 5 MG tablet   losartan -hydrochlorothiazide  (HYZAAR) 100-25 MG tablet   Other Visit Diagnoses       Tobacco use disorder         Palpitations       Relevant Orders   EKG 12-Lead   LONG TERM MONITOR (3-14 DAYS)   Comprehensive metabolic panel with GFR   CBC   TSH     Chronic cough               Palpitations Intermittent palpitations, particularly when lying down, not associated with exertion. Differential includes  benign causes and more serious cardiac conditions. Previous cardiology evaluation last year, with follow-up due in July. - Ordered EKG - Ordered blood work to evaluate thyroid  function, anemia, and electrolytes - Ordered 2-week cardiac event monitor to assess electrical activity of the heart  Chronic cough due to tobacco use Chronic cough likely secondary to tobacco use. Lungs are clear on examination. No shortness of breath reported during coughing fits. - Recommended smoking cessation to address chronic cough - Suggested honey HycoDan drops for symptomatic relief of cough  General health maintenance Due for tetanus booster. Declined flu shot. - Administered tetanus booster     Return in about 3 months (around 04/13/2024) for chronic disease f/u.       Jon Eva, MD  [facility] [phone] (phone) [phone] (fax)  [facility]      [1]  Outpatient Medications Prior to Visit  Medication Sig   acetaminophen  (TYLENOL ) 650 MG CR tablet Take 650 mg by mouth every 8 (eight) hours as needed for pain.   carvedilol  (COREG ) 12.5 MG tablet Take 1 tablet (12.5 mg total) by mouth 2 (two) times daily with a meal.   GNP GARLIC EXTRACT PO Take 1,000 mg by mouth daily.   methocarbamol  (ROBAXIN -750) 750 MG tablet Take 1 tablet (750 mg total) by mouth every 6 (six) hours as needed for muscle spasms.   [DISCONTINUED] hydrochlorothiazide  (HYDRODIURIL ) 25 MG tablet Take 1 tablet (25 mg total) by mouth daily.   [DISCONTINUED] losartan  (COZAAR ) 100 MG tablet Take 1 tablet (100 mg total) by mouth daily.   [DISCONTINUED] rosuvastatin  (CRESTOR ) 5 MG tablet Take 1 tablet (5 mg total) by mouth daily.   No facility-administered medications prior to visit.   "

## 2024-01-14 NOTE — Assessment & Plan Note (Signed)
 Blood pressure is well-controlled with current medication regimen. He is compliant with medication and reports no issues. - Continue current antihypertensive regimen: rosuvastatin  5 mg daily, hydrochlorothiazide  25 mg daily, losartan  100 mg daily, and carvedilol  twice daily - Switched losartan  and hydrochlorothiazide  to a combination pill for convenience

## 2024-01-15 LAB — COMPREHENSIVE METABOLIC PANEL WITH GFR
ALT: 20 IU/L (ref 0–44)
AST: 21 IU/L (ref 0–40)
Albumin: 4.5 g/dL (ref 3.9–4.9)
Alkaline Phosphatase: 81 IU/L (ref 47–123)
BUN/Creatinine Ratio: 15 (ref 10–24)
BUN: 15 mg/dL (ref 8–27)
Bilirubin Total: 0.4 mg/dL (ref 0.0–1.2)
CO2: 25 mmol/L (ref 20–29)
Calcium: 9.7 mg/dL (ref 8.6–10.2)
Chloride: 102 mmol/L (ref 96–106)
Creatinine, Ser: 0.98 mg/dL (ref 0.76–1.27)
Globulin, Total: 2.3 g/dL (ref 1.5–4.5)
Glucose: 84 mg/dL (ref 70–99)
Potassium: 3.7 mmol/L (ref 3.5–5.2)
Sodium: 141 mmol/L (ref 134–144)
Total Protein: 6.8 g/dL (ref 6.0–8.5)
eGFR: 87 mL/min/1.73

## 2024-01-15 LAB — CBC
Hematocrit: 39.3 % (ref 37.5–51.0)
Hemoglobin: 12.8 g/dL — ABNORMAL LOW (ref 13.0–17.7)
MCH: 30.8 pg (ref 26.6–33.0)
MCHC: 32.6 g/dL (ref 31.5–35.7)
MCV: 95 fL (ref 79–97)
Platelets: 182 x10E3/uL (ref 150–450)
RBC: 4.15 x10E6/uL (ref 4.14–5.80)
RDW: 12.3 % (ref 11.6–15.4)
WBC: 8.1 x10E3/uL (ref 3.4–10.8)

## 2024-01-15 LAB — TSH: TSH: 0.717 u[IU]/mL (ref 0.450–4.500)

## 2024-01-16 ENCOUNTER — Ambulatory Visit: Payer: Self-pay | Admitting: Family Medicine

## 2024-01-16 NOTE — Telephone Encounter (Signed)
 Copied from CRM #[id number]. Topic: Clinical - Lab/Test Results >> Jan 16, 2024 10:50 AM Shanda MATSU wrote: Reason for CRM: Patient called in regards to Mychart message he recvd about lab results, adv patient of the following info: Normal/stable labs. Patient had no further questions.

## 2024-02-08 DIAGNOSIS — R002 Palpitations: Secondary | ICD-10-CM | POA: Diagnosis not present

## 2024-02-12 ENCOUNTER — Other Ambulatory Visit: Payer: Self-pay | Admitting: Family Medicine

## 2024-02-12 NOTE — Telephone Encounter (Unsigned)
 Copied from CRM #[id number]. Topic: Clinical - Medication Refill >> Feb 12, 2024  5:15 PM Winona R wrote: Medication:  carvedilol  (COREG ) 12.5 MG tablet  losartan -hydrochlorothiazide  (HYZAAR) 100-25 MG tablet rosuvastatin  (CRESTOR ) 5 MG tablet  Has the patient contacted their pharmacy? Yes (Agent: If no, request that the patient contact the pharmacy for the refill. If patient does not wish to contact the pharmacy document the reason why and proceed with request.) (Agent: If yes, when and what did the pharmacy advise?)  This is the patient's preferred pharmacy:  [facility] [place] [place] - [address] [place] [address] Phone: [phone] Fax: [phone]  Is this the correct pharmacy for this prescription? Yes If no, delete pharmacy and type the correct one.   Has the prescription been filled recently? Yes  Is the patient out of the medication? No 3 days left  Has the patient been seen for an appointment in the last year OR does the patient have an upcoming appointment? Yes  Can we respond through MyChart? Yes  Agent: Please be advised that Rx refills may take up to 3 business days. We ask that you follow-up with your pharmacy.

## 2024-02-14 MED ORDER — ROSUVASTATIN CALCIUM 5 MG PO TABS: 5.0000 mg | ORAL_TABLET | Freq: Every day | ORAL | 4 refills | Status: AC

## 2024-02-14 MED ORDER — LOSARTAN POTASSIUM-HCTZ 100-25 MG PO TABS: 1.0000 | ORAL_TABLET | Freq: Every day | ORAL | 4 refills | Status: AC

## 2024-02-14 MED ORDER — CARVEDILOL 12.5 MG PO TABS: 12.5000 mg | ORAL_TABLET | Freq: Two times a day (BID) | ORAL | 0 refills | Status: AC

## 2024-02-14 NOTE — Telephone Encounter (Signed)
 Requested by patient. Future visit 04/24/24.  Requested Prescriptions  Pending Prescriptions Disp Refills   carvedilol  (COREG ) 12.5 MG tablet 180 tablet 0    Sig: Take 1 tablet (12.5 mg total) by mouth 2 (two) times daily with a meal.     Cardiovascular: Beta Blockers 3 Passed - 02/14/2024  1:09 PM      Passed - Cr in normal range and within 360 days    Creatinine, Ser  Date Value Ref Range Status  01/14/2024 0.98 0.76 - 1.27 mg/dL Final   Creatinine, Urine  Date Value Ref Range Status  02/04/2019 196.78 mg/dL Final    Comment:    Performed at [facility], [address], [place], [place] [address]         Passed - AST in normal range and within 360 days    AST  Date Value Ref Range Status  01/14/2024 21 0 - 40 IU/L Final         Passed - ALT in normal range and within 360 days    ALT  Date Value Ref Range Status  01/14/2024 20 0 - 44 IU/L Final         Passed - Last BP in normal range    BP Readings from Last 1 Encounters:  01/14/24 121/87         Passed - Last Heart Rate in normal range    Pulse Readings from Last 1 Encounters:  01/14/24 79         Passed - Valid encounter within last 6 months    Recent Outpatient Visits           1 month ago Essential hypertension   [facility] [place], Jon HERO, MD   1 month ago Essential hypertension   [place] [facility] [place], Jon HERO, MD   3 months ago Essential hypertension   [place] [facility] [place], Jon HERO, MD   4 months ago Encounter for annual physical exam   [place] [facility] [place], Jon HERO, MD   4 months ago Viral URI with cough    [facility] [place], Jon HERO, MD               losartan -hydrochlorothiazide  (HYZAAR) 100-25 MG tablet 30 tablet 4    Sig: Take 1 tablet by mouth daily.     Cardiovascular: ARB + Diuretic Combos Passed - 02/14/2024  1:09 PM      Passed  - K in normal range and within 180 days    Potassium  Date Value Ref Range Status  01/14/2024 3.7 3.5 - 5.2 mmol/L Final         Passed - Na in normal range and within 180 days    Sodium  Date Value Ref Range Status  01/14/2024 141 134 - 144 mmol/L Final         Passed - Cr in normal range and within 180 days    Creatinine, Ser  Date Value Ref Range Status  01/14/2024 0.98 0.76 - 1.27 mg/dL Final   Creatinine, Urine  Date Value Ref Range Status  02/04/2019 196.78 mg/dL Final    Comment:    Performed at [facility], [address] [place], [place] [address]         Passed - eGFR is 10 or above and within 180 days    GFR calc Af [place]  Date Value Ref Range Status  02/04/2019 >60 >60 mL/min Final   GFR, Estimated  Date Value Ref Range Status  06/14/2023 >60 >60 mL/min Final    Comment:    (NOTE) Calculated using the CKD-EPI Creatinine Equation (2021)    eGFR  Date Value Ref Range Status  01/14/2024 87 >59 mL/min/1.73 Final         Passed - Patient is not pregnant      Passed - Last BP in normal range    BP Readings from Last 1 Encounters:  01/14/24 121/87         Passed - Valid encounter within last 6 months    Recent Outpatient Visits           1 month ago Essential hypertension   [facility] [place], Jon HERO, MD   1 month ago Essential hypertension   [facility] [place], Jon HERO, MD   3 months ago Essential hypertension   [facility] [place], Jon HERO, MD   4 months ago Encounter for annual physical exam   [place] [facility] [place], Jon HERO, MD   4 months ago Viral URI with cough   [place] [facility] [place], Jon HERO, MD               rosuvastatin  (CRESTOR ) 5 MG tablet 30 tablet 4    Sig: Take 1 tablet (5 mg total) by mouth daily.     Cardiovascular:  Antilipid - Statins 2 Failed - 02/14/2024  1:09 PM       Failed - Lipid Panel in normal range within the last 12 months    Cholesterol, Total  Date Value Ref Range Status  10/09/2023 161 100 - 199 mg/dL Final   LDL Chol Calc (NIH)  Date Value Ref Range Status  10/09/2023 97 0 - 99 mg/dL Final   HDL  Date Value Ref Range Status  10/09/2023 46 >39 mg/dL Final   Triglycerides  Date Value Ref Range Status  10/09/2023 96 0 - 149 mg/dL Final         Passed - Cr in normal range and within 360 days    Creatinine, Ser  Date Value Ref Range Status  01/14/2024 0.98 0.76 - 1.27 mg/dL Final   Creatinine, Urine  Date Value Ref Range Status  02/04/2019 196.78 mg/dL Final    Comment:    Performed at [facility], [address] [place], [place] [address]         Passed - Patient is not pregnant      Passed - Valid encounter within last 12 months    Recent Outpatient Visits           1 month ago Essential hypertension   [place] [facility] [place], Jon HERO, MD   1 month ago Essential hypertension   [place] [facility] [place], Jon HERO, MD   3 months ago Essential hypertension   [place] [facility] [place], Jon HERO, MD   4 months ago Encounter for annual physical exam   [facility] [place], Jon HERO, MD   4 months ago Viral URI with cough   [facility] [place], Jon HERO, MD

## 2024-04-24 ENCOUNTER — Ambulatory Visit: Admitting: Family Medicine

## 2024-08-07 ENCOUNTER — Other Ambulatory Visit
